# Patient Record
Sex: Female | Born: 1935
Health system: Southern US, Community
[De-identification: ages and names within clinical notes are randomized; demographics above are authoritative.]

## PROBLEM LIST (undated history)

## (undated) DIAGNOSIS — I10 Essential (primary) hypertension: Secondary | ICD-10-CM

## (undated) DIAGNOSIS — G459 Transient cerebral ischemic attack, unspecified: Secondary | ICD-10-CM

## (undated) DIAGNOSIS — J45909 Unspecified asthma, uncomplicated: Secondary | ICD-10-CM

## (undated) DIAGNOSIS — Z8719 Personal history of other diseases of the digestive system: Secondary | ICD-10-CM

## (undated) DIAGNOSIS — H9190 Unspecified hearing loss, unspecified ear: Secondary | ICD-10-CM

## (undated) DIAGNOSIS — Z8711 Personal history of peptic ulcer disease: Secondary | ICD-10-CM

## (undated) DIAGNOSIS — C44321 Squamous cell carcinoma of skin of nose: Secondary | ICD-10-CM

## (undated) DIAGNOSIS — I351 Nonrheumatic aortic (valve) insufficiency: Secondary | ICD-10-CM

## (undated) DIAGNOSIS — K56609 Unspecified intestinal obstruction, unspecified as to partial versus complete obstruction: Secondary | ICD-10-CM

## (undated) DIAGNOSIS — I099 Rheumatic heart disease, unspecified: Secondary | ICD-10-CM

## (undated) DIAGNOSIS — R7611 Nonspecific reaction to tuberculin skin test without active tuberculosis: Secondary | ICD-10-CM

## (undated) DIAGNOSIS — C44311 Basal cell carcinoma of skin of nose: Secondary | ICD-10-CM

## (undated) DIAGNOSIS — K5090 Crohn's disease, unspecified, without complications: Secondary | ICD-10-CM

## (undated) DIAGNOSIS — E039 Hypothyroidism, unspecified: Secondary | ICD-10-CM

## (undated) DIAGNOSIS — K219 Gastro-esophageal reflux disease without esophagitis: Secondary | ICD-10-CM

## (undated) DIAGNOSIS — Z8679 Personal history of other diseases of the circulatory system: Secondary | ICD-10-CM

## (undated) DIAGNOSIS — I350 Nonrheumatic aortic (valve) stenosis: Secondary | ICD-10-CM

## (undated) DIAGNOSIS — M1990 Unspecified osteoarthritis, unspecified site: Secondary | ICD-10-CM

## (undated) DIAGNOSIS — H353130 Nonexudative age-related macular degeneration, bilateral, stage unspecified: Secondary | ICD-10-CM

## (undated) HISTORY — PX: CARPAL TUNNEL RELEASE: SHX101

## (undated) HISTORY — PX: CATARACT EXTRACTION W/ INTRAOCULAR LENS IMPLANT: SHX1309

## (undated) HISTORY — PX: CHOLECYSTECTOMY: SHX55

## (undated) HISTORY — PX: SQUAMOUS CELL CARCINOMA EXCISION: SHX2433

## (undated) HISTORY — PX: TONSILLECTOMY: SUR1361

## (undated) HISTORY — PX: BASAL CELL CARCINOMA EXCISION: SHX1214

---

## 2017-05-10 ENCOUNTER — Ambulatory Visit: Payer: Medicare Other | Attending: Ophthalmology | Admitting: Occupational Therapy

## 2017-05-10 DIAGNOSIS — H53413 Scotoma involving central area, bilateral: Secondary | ICD-10-CM | POA: Diagnosis present

## 2017-05-10 NOTE — Therapy (Signed)
[facility] [address] [place], [place], [address] Phone: [phone]   Fax:  [phone]  Occupational Therapy Evaluation  Patient Details  Name: Terri [name] MRN: [id number] Date of Birth: January 29, 1931 Referring Provider: Dr. Zadie Rhine  Encounter Date: 05/10/2017      OT End of Session - 05/10/17 1328    Visit Number 1   Number of Visits 1   Authorization Type BCBS Medicare    OT Start Time 1320   OT Stop Time 1420   OT Time Calculation (min) 60 min   Activity Tolerance Patient tolerated treatment well   Behavior During Therapy [facility] for tasks assessed/performed      No past medical history on file.  No past surgical history on file.  There were no vitals filed for this visit.      Subjective Assessment - 05/10/17 1324    Subjective  Pt with macular degneration presents with a decline in vision that impedes performance of ADLS/ IADLS   Pertinent History see above   Patient Stated Goals magnification for reading   Currently in Pain? No/denies           [facility] OT Assessment - 05/10/17 1329      Assessment   Diagnosis macular degeneration   Referring Provider Dr. Zadie Rhine   Onset Date 03/07/17  diagnosed 1996     Precautions   Precautions Fall     Balance Screen   Has the patient fallen in the past 6 months Yes   How many times? 1   Has the patient had a decrease in activity level because of a fear of falling?  Yes   Is the patient reluctant to leave their home because of a fear of falling?  No     Home  Environment   Family/patient expects to be discharged to: Private residence   [place] to live on main level with bedroom/bathroom   Bathroom Shower/Tub [place] With Son     Prior Function   Level of Independence Independent with basic ADLs;Independent with household mobility without device   Vocation Retired     ADL   ADL comments modified independent with basic ADLS     IADL   Shopping Needs to be accompanied on any shopping trip   Light Housekeeping Performs light daily tasks but cannot maintain acceptable level of cleanliness   Meal Prep Able to complete simple warm meal prep  stovetop only   Medication Management --  uses a Transport planner Requires assistance     Mobility   Mobility Status Independent     Vision - History   Visual History Macular degeneration   Patient Visual Report Central vision impairment     Vision Assessment   Vision Assessment Vision tested   Per MD/OD Report OD 20/400, OS E card@ 2 ft   Reading Acuity 20/320   Comment Pt reports increased difficulty reading, and watching TV. Pt reports she is able to read large print on her I pad. Pt has stopped driving     Cognition   Overall Cognitive Status Within Functional Limits for tasks assessed   Mini Mental State Exam  28/30                         OT Education - 05/10/17 1439    Education provided Yes  Education Details use of [place] mini and binocular spectacles for TV viewing   Person(s) Educated Patient   Methods Explanation;Demonstration;Verbal cues;Handout   Comprehension Verbalized understanding;Returned demonstration;Verbal cues required                    Plan - 05/10/17 1435    Clinical Impression Statement Pt with macular degeneration presents with visual deficits which impede performance of ADLS/ IADLS. Pt can beneit from skilled occupational therapy to maximize pt's safety and independence.   Occupational Profile and client history currently impacting functional performance visual deficits impede pt's ability to read, drive and safely navigate an unfamiliar environment   Occupational performance deficits (Please refer to evaluation for details): ADL's;IADL's;Leisure;Social Participation   Rehab Potential Good   Current Impairments/barriers affecting  progress: visual impairment   OT Frequency One time visit   OT Duration 8 weeks   OT Treatment/Interventions Visual/perceptual remediation/compensation;DME and/or AE instruction;Patient/family education   Plan Pt was seen for evaluation and treatment on day of eval. Pt was provided with education regarding a video handheld magnifier use and TV viewing binoculars. Pt demonstrates understanding of all education and pt does not require additional OT at this time, therefore no goals were set.   Consulted and Agree with Plan of Care Patient      Patient will benefit from skilled therapeutic intervention in order to improve the following deficits and impairments:  Impaired vision/preception  Visit Diagnosis: [place] involving central area, bilateral - Plan: Ot plan of care cert/re-cert      G-Codes - 02/58/52 1441    Functional Assessment Tool Used (Outpatient only) clinical impressions   Functional Limitation Self care   Self Care Current Status [phone]) At least 1 percent but less than 20 percent impaired, limited or restricted   Self Care Goal Status ([id number]) At least 1 percent but less than 20 percent impaired, limited or restricted   Self Care Discharge Status [phone]) At least 1 percent but less than 20 percent impaired, limited or restricted      Problem List There are no active problems to display for this patient.   RINE,KATHRYN 05/10/2017, 2:45 PM Theone Murdoch, OTR/L [phone] Phone: [phone] 2:45 PM 05/10/17 [facility] [address] [place], [place], [address] Phone: [phone]   Fax:  [phone]  Name: Terri [name] MRN: [id number] Date of Birth: [birth year]

## 2017-05-31 ENCOUNTER — Emergency Department (HOSPITAL_COMMUNITY): Payer: Medicare Other

## 2017-05-31 ENCOUNTER — Encounter (HOSPITAL_COMMUNITY): Payer: Self-pay | Admitting: Emergency Medicine

## 2017-05-31 ENCOUNTER — Inpatient Hospital Stay (HOSPITAL_COMMUNITY)
Admission: EM | Admit: 2017-05-31 | Discharge: 2017-06-03 | DRG: 386 | Disposition: A | Discharge status: Home / Self Care | Payer: Medicare Other | Attending: Family Medicine | Admitting: Family Medicine

## 2017-05-31 DIAGNOSIS — Z9049 Acquired absence of other specified parts of digestive tract: Secondary | ICD-10-CM | POA: Diagnosis not present

## 2017-05-31 DIAGNOSIS — I060 Rheumatic aortic stenosis: Secondary | ICD-10-CM | POA: Diagnosis present

## 2017-05-31 DIAGNOSIS — H353130 Nonexudative age-related macular degeneration, bilateral, stage unspecified: Secondary | ICD-10-CM | POA: Diagnosis present

## 2017-05-31 DIAGNOSIS — K5090 Crohn's disease, unspecified, without complications: Secondary | ICD-10-CM

## 2017-05-31 DIAGNOSIS — E039 Hypothyroidism, unspecified: Secondary | ICD-10-CM | POA: Diagnosis present

## 2017-05-31 DIAGNOSIS — K5000 Crohn's disease of small intestine without complications: Secondary | ICD-10-CM | POA: Diagnosis not present

## 2017-05-31 DIAGNOSIS — Z9842 Cataract extraction status, left eye: Secondary | ICD-10-CM | POA: Diagnosis not present

## 2017-05-31 DIAGNOSIS — Z87891 Personal history of nicotine dependence: Secondary | ICD-10-CM

## 2017-05-31 DIAGNOSIS — Z85828 Personal history of other malignant neoplasm of skin: Secondary | ICD-10-CM | POA: Diagnosis not present

## 2017-05-31 DIAGNOSIS — H548 Legal blindness, as defined in USA: Secondary | ICD-10-CM | POA: Diagnosis present

## 2017-05-31 DIAGNOSIS — Z8673 Personal history of transient ischemic attack (TIA), and cerebral infarction without residual deficits: Secondary | ICD-10-CM

## 2017-05-31 DIAGNOSIS — E860 Dehydration: Secondary | ICD-10-CM | POA: Insufficient documentation

## 2017-05-31 DIAGNOSIS — H9190 Unspecified hearing loss, unspecified ear: Secondary | ICD-10-CM | POA: Insufficient documentation

## 2017-05-31 DIAGNOSIS — R159 Full incontinence of feces: Secondary | ICD-10-CM | POA: Diagnosis not present

## 2017-05-31 DIAGNOSIS — T380X5A Adverse effect of glucocorticoids and synthetic analogues, initial encounter: Secondary | ICD-10-CM | POA: Diagnosis present

## 2017-05-31 DIAGNOSIS — K50012 Crohn's disease of small intestine with intestinal obstruction: Secondary | ICD-10-CM | POA: Diagnosis present

## 2017-05-31 DIAGNOSIS — K567 Ileus, unspecified: Secondary | ICD-10-CM | POA: Diagnosis present

## 2017-05-31 DIAGNOSIS — K56609 Unspecified intestinal obstruction, unspecified as to partial versus complete obstruction: Secondary | ICD-10-CM | POA: Diagnosis not present

## 2017-05-31 DIAGNOSIS — D72829 Elevated white blood cell count, unspecified: Secondary | ICD-10-CM

## 2017-05-31 DIAGNOSIS — I10 Essential (primary) hypertension: Secondary | ICD-10-CM | POA: Diagnosis present

## 2017-05-31 DIAGNOSIS — N179 Acute kidney failure, unspecified: Secondary | ICD-10-CM | POA: Diagnosis present

## 2017-05-31 DIAGNOSIS — R1114 Bilious vomiting: Secondary | ICD-10-CM | POA: Diagnosis not present

## 2017-05-31 DIAGNOSIS — J45909 Unspecified asthma, uncomplicated: Secondary | ICD-10-CM | POA: Diagnosis present

## 2017-05-31 DIAGNOSIS — Z961 Presence of intraocular lens: Secondary | ICD-10-CM | POA: Diagnosis present

## 2017-05-31 DIAGNOSIS — Z8679 Personal history of other diseases of the circulatory system: Secondary | ICD-10-CM | POA: Insufficient documentation

## 2017-05-31 DIAGNOSIS — H3530 Unspecified macular degeneration: Secondary | ICD-10-CM | POA: Insufficient documentation

## 2017-05-31 DIAGNOSIS — Z79899 Other long term (current) drug therapy: Secondary | ICD-10-CM | POA: Diagnosis not present

## 2017-05-31 DIAGNOSIS — Z8711 Personal history of peptic ulcer disease: Secondary | ICD-10-CM

## 2017-05-31 DIAGNOSIS — Z7902 Long term (current) use of antithrombotics/antiplatelets: Secondary | ICD-10-CM

## 2017-05-31 DIAGNOSIS — R1110 Vomiting, unspecified: Secondary | ICD-10-CM | POA: Diagnosis present

## 2017-05-31 DIAGNOSIS — K219 Gastro-esophageal reflux disease without esophagitis: Secondary | ICD-10-CM | POA: Diagnosis present

## 2017-05-31 DIAGNOSIS — Z9841 Cataract extraction status, right eye: Secondary | ICD-10-CM

## 2017-05-31 HISTORY — DX: Personal history of other diseases of the circulatory system: Z86.79

## 2017-05-31 HISTORY — DX: Unspecified osteoarthritis, unspecified site: M19.90

## 2017-05-31 HISTORY — DX: Personal history of other diseases of the digestive system: Z87.19

## 2017-05-31 HISTORY — DX: Crohn's disease, unspecified, without complications: K50.90

## 2017-05-31 HISTORY — DX: Rheumatic heart disease, unspecified: I09.9

## 2017-05-31 HISTORY — DX: Unspecified intestinal obstruction, unspecified as to partial versus complete obstruction: K56.609

## 2017-05-31 HISTORY — DX: Squamous cell carcinoma of skin of nose: C44.321

## 2017-05-31 HISTORY — DX: Transient cerebral ischemic attack, unspecified: G45.9

## 2017-05-31 HISTORY — DX: Unspecified hearing loss, unspecified ear: H91.90

## 2017-05-31 HISTORY — DX: Nonrheumatic aortic (valve) insufficiency: I35.1

## 2017-05-31 HISTORY — DX: Basal cell carcinoma of skin of nose: C44.311

## 2017-05-31 HISTORY — DX: Personal history of peptic ulcer disease: Z87.11

## 2017-05-31 HISTORY — DX: Gastro-esophageal reflux disease without esophagitis: K21.9

## 2017-05-31 HISTORY — DX: Unspecified asthma, uncomplicated: J45.909

## 2017-05-31 HISTORY — DX: Nonrheumatic aortic (valve) stenosis: I35.0

## 2017-05-31 HISTORY — DX: Hypothyroidism, unspecified: E03.9

## 2017-05-31 HISTORY — DX: Nonexudative age-related macular degeneration, bilateral, stage unspecified: H35.3130

## 2017-05-31 HISTORY — DX: Nonspecific reaction to tuberculin skin test without active tuberculosis: R76.11

## 2017-05-31 LAB — COMPREHENSIVE METABOLIC PANEL
ALBUMIN: 3.4 g/dL — AB (ref 3.5–5.0)
ALT: 10 U/L — AB (ref 14–54)
AST: 20 U/L (ref 15–41)
Alkaline Phosphatase: 83 U/L (ref 38–126)
Anion gap: 12 (ref 5–15)
BUN: 15 mg/dL (ref 6–20)
CHLORIDE: 101 mmol/L (ref 101–111)
CO2: 23 mmol/L (ref 22–32)
CREATININE: 1.3 mg/dL — AB (ref 0.44–1.00)
Calcium: 8.5 mg/dL — ABNORMAL LOW (ref 8.9–10.3)
GFR calc non Af Amer: 37 mL/min — ABNORMAL LOW (ref >60)
GFR, EST AFRICAN AMERICAN: 43 mL/min — AB (ref >60)
GLUCOSE: 133 mg/dL — AB (ref 65–99)
Potassium: 4.3 mmol/L (ref 3.5–5.1)
SODIUM: 136 mmol/L (ref 135–145)
Total Bilirubin: 1 mg/dL (ref 0.3–1.2)
Total Protein: 6.7 g/dL (ref 6.5–8.1)

## 2017-05-31 LAB — CBC WITH DIFFERENTIAL/PLATELET
BASOS ABS: 0 10*3/uL (ref 0.0–0.1)
BASOS PCT: 0 %
EOS ABS: 0 10*3/uL (ref 0.0–0.7)
EOS PCT: 0 %
HCT: 40.3 % (ref 36.0–46.0)
HEMOGLOBIN: 13.2 g/dL (ref 12.0–15.0)
LYMPHS ABS: 2.4 10*3/uL (ref 0.7–4.0)
Lymphocytes Relative: 18 %
MCH: 28.9 pg (ref 26.0–34.0)
MCHC: 32.8 g/dL (ref 30.0–36.0)
MCV: 88.4 fL (ref 78.0–100.0)
Monocytes Absolute: 1 10*3/uL (ref 0.1–1.0)
Monocytes Relative: 8 %
NEUTROS PCT: 74 %
Neutro Abs: 10 10*3/uL — ABNORMAL HIGH (ref 1.7–7.7)
PLATELETS: 203 10*3/uL (ref 150–400)
RBC: 4.56 MIL/uL (ref 3.87–5.11)
RDW: 14.9 % (ref 11.5–15.5)
WBC: 13.4 10*3/uL — AB (ref 4.0–10.5)

## 2017-05-31 LAB — URINALYSIS, ROUTINE W REFLEX MICROSCOPIC
Bilirubin Urine: NEGATIVE
GLUCOSE, UA: NEGATIVE mg/dL
Hgb urine dipstick: NEGATIVE
Ketones, ur: 5 mg/dL — AB
LEUKOCYTES UA: NEGATIVE
NITRITE: NEGATIVE
PH: 5 (ref 5.0–8.0)
Protein, ur: NEGATIVE mg/dL

## 2017-05-31 LAB — CBC
HEMATOCRIT: 37.5 % (ref 36.0–46.0)
Hemoglobin: 12 g/dL (ref 12.0–15.0)
MCH: 28.4 pg (ref 26.0–34.0)
MCHC: 32 g/dL (ref 30.0–36.0)
MCV: 88.9 fL (ref 78.0–100.0)
Platelets: 194 10*3/uL (ref 150–400)
RBC: 4.22 MIL/uL (ref 3.87–5.11)
RDW: 15 % (ref 11.5–15.5)
WBC: 13.6 10*3/uL — ABNORMAL HIGH (ref 4.0–10.5)

## 2017-05-31 LAB — CREATININE, SERUM
CREATININE: 1.2 mg/dL — AB (ref 0.44–1.00)
GFR calc non Af Amer: 41 mL/min — ABNORMAL LOW (ref >60)
GFR, EST AFRICAN AMERICAN: 47 mL/min — AB (ref >60)

## 2017-05-31 LAB — LIPASE, BLOOD: Lipase: 16 U/L (ref 11–51)

## 2017-05-31 MED ORDER — IOPAMIDOL (ISOVUE-300) INJECTION 61%
INTRAVENOUS | Status: AC
  Filled 2017-05-31: qty 100

## 2017-05-31 MED ORDER — ONDANSETRON HCL 4 MG/2ML IJ SOLN: 4.0000 mg | Freq: Four times a day (QID) | INTRAMUSCULAR | Status: DC | PRN

## 2017-05-31 MED ORDER — ONDANSETRON HCL 4 MG/2ML IJ SOLN
4.0000 mg | Freq: Once | INTRAMUSCULAR | Status: AC
  Administered 2017-05-31: 4 mg via INTRAVENOUS
  Filled 2017-05-31: qty 2

## 2017-05-31 MED ORDER — FAMOTIDINE IN NACL 20-0.9 MG/50ML-% IV SOLN
20.0000 mg | Freq: Once | INTRAVENOUS | Status: AC
  Administered 2017-05-31: 20 mg via INTRAVENOUS
  Filled 2017-05-31: qty 50

## 2017-05-31 MED ORDER — ENOXAPARIN SODIUM 30 MG/0.3ML ~~LOC~~ SOLN
30.0000 mg | SUBCUTANEOUS | Status: DC
  Administered 2017-05-31 – 2017-06-02 (×3): 30 mg via SUBCUTANEOUS
  Filled 2017-05-31 (×3): qty 0.3

## 2017-05-31 MED ORDER — SODIUM CHLORIDE 0.9 % IV BOLUS (SEPSIS)
1000.0000 mL | Freq: Once | INTRAVENOUS | Status: AC
  Administered 2017-05-31: 1000 mL via INTRAVENOUS

## 2017-05-31 MED ORDER — IOPAMIDOL (ISOVUE-300) INJECTION 61%
INTRAVENOUS | Status: AC
  Administered 2017-05-31: 100 mL
  Filled 2017-05-31: qty 30

## 2017-05-31 MED ORDER — ONDANSETRON HCL 4 MG/2ML IJ SOLN: 4.0000 mg | Freq: Once | INTRAMUSCULAR | Status: DC

## 2017-05-31 MED ORDER — SODIUM CHLORIDE 0.9 % IV SOLN
INTRAVENOUS | Status: AC
  Administered 2017-05-31 – 2017-06-01 (×2): via INTRAVENOUS

## 2017-05-31 MED ORDER — METOCLOPRAMIDE HCL 5 MG/ML IJ SOLN
10.0000 mg | Freq: Once | INTRAMUSCULAR | Status: AC
  Administered 2017-05-31: 10 mg via INTRAVENOUS
  Filled 2017-05-31: qty 2

## 2017-05-31 MED ORDER — ONDANSETRON HCL 4 MG PO TABS: 4.0000 mg | ORAL_TABLET | Freq: Four times a day (QID) | ORAL | Status: DC | PRN

## 2017-05-31 MED ORDER — ACETAMINOPHEN 650 MG RE SUPP
650.0000 mg | Freq: Four times a day (QID) | RECTAL | Status: DC | PRN
  Filled 2017-05-31: qty 1

## 2017-05-31 MED ORDER — MORPHINE SULFATE (PF) 4 MG/ML IV SOLN
1.0000 mg | INTRAVENOUS | Status: DC | PRN
Start: 2017-05-31 — End: 2017-06-03
  Administered 2017-05-31: 1 mg via INTRAVENOUS
  Filled 2017-05-31: qty 1

## 2017-05-31 MED ORDER — PANTOPRAZOLE SODIUM 40 MG IV SOLR
40.0000 mg | INTRAVENOUS | Status: DC
Start: 2017-05-31 — End: 2017-06-02
  Administered 2017-05-31 – 2017-06-01 (×2): 40 mg via INTRAVENOUS
  Filled 2017-05-31 (×2): qty 40

## 2017-05-31 MED ORDER — ACETAMINOPHEN 325 MG PO TABS: 650.0000 mg | ORAL_TABLET | Freq: Four times a day (QID) | ORAL | Status: DC | PRN

## 2017-05-31 NOTE — ED Notes (Signed)
Dr. Cardama at bedside.  

## 2017-05-31 NOTE — ED Triage Notes (Signed)
Pt here for abd pain and vomiting; pt was recently treated for UTI with cipro

## 2017-05-31 NOTE — ED Notes (Signed)
Patient transported to CT 

## 2017-05-31 NOTE — ED Notes (Signed)
Resident at bedside.  

## 2017-05-31 NOTE — ED Notes (Signed)
Provider at bedside

## 2017-05-31 NOTE — H&P (Signed)
[facility] History and Physical  Terri [name] QHU:[id number] DOB: February 14, 1931 DOA: 05/31/2017  Referring physician: Dr Zetta Bills PCP: Prince Solian, MD  GI: [facility]  Chief Complaint: vomiting, abdominal pain  HPI: Terri [name] is a pleasant 83yo recently relocated to [place] to live with son after husband passed away in 2023/01/12. PMH of Crohn's, TIA, macular degeneration, HOH, remote cholecystectomy, rheumatic fever. Pt presents today with 2 day hx of abdominal pain and vomiting. Pt with hx of Crohn's ileitus on Mesalazine which was recently increased by GI from 4 pills to 8 pills/day a couple of weeks ago along with addition of Budesonide for suspected Crohns flare. Son is pediatrician, treated pt one week ago for suspected UTI with Cipro. Pt states symptoms become considerably worse 2 days ago with diffuse, stabbing lower abdominal pain and bilious emesis prompting ED visit. Pt denies recent fever, headache, dizziness, chest pain, sob, diarrhea, melana, hematochezia or hematemesis.   ED COURSE CT abdomen c/w Crohn's disease with secondary bowel obstruction. NG tube placed and 1L bilious emesis returned. Labs remarkable for slightly elevated WBC at 13.4, LFTs and lipase wnl. Creatinine 1.3 with no baseline labs available. [facility] has been asked to admit.   Review of Systems:  As stated in hpi, otherwise negative   Past Medical History:  Diagnosis Date  . Aortic stenosis   . Crohn's disease ([place])   . HOH (hard of hearing)   . Hx of cholecystectomy   . Hx of rheumatic fever   . Hypothyroidism   . Macular degeneration   . TIA (transient ischemic attack)     Social History:  reports that she has never smoked. She has never used smokeless tobacco. She reports that she does not drink alcohol or use drugs. Widowed in 12/2016. Retired Therapist, sports. Relocated to [place] to live with son.   No Known Allergies  History reviewed. No pertinent family history.   Prior to Admission  medications   Medication Sig Start Date End Date Taking? Authorizing Provider  Budesonide 9 MG TB24 Take 9 mg by mouth daily. 05/30/17  Yes [provider]  carboxymethylcellulose (REFRESH PLUS) 0.5 % SOLN Place 1 drop into both eyes daily as needed (dry eyes).   Yes [provider]  clopidogrel (PLAVIX) 75 MG tablet Take 75 mg by mouth daily. 04/26/17  Yes [provider]  folic acid (FOLVITE) 1 MG tablet Take 1 mg by mouth daily.   Yes [provider]  levothyroxine (SYNTHROID, LEVOTHROID) 100 MCG tablet Take 100 mcg by mouth daily before breakfast.   Yes [provider]  losartan (COZAAR) 50 MG tablet Take 50 mg by mouth daily. 04/26/17  Yes [provider]  Multiple Vitamin (MULTIVITAMIN WITH MINERALS) TABS tablet Take 1 tablet by mouth daily.   Yes [provider]  oxybutynin (DITROPAN) 5 MG tablet Take 5 mg by mouth daily.   Yes [provider]  pantoprazole (PROTONIX) 40 MG tablet Take 40 mg by mouth every morning. 05/15/17  Yes [provider]   Physical Exam: Vitals:   05/31/17 1434 05/31/17 1445 05/31/17 1500 05/31/17 1521  BP: 138/84 130/85 132/81   Pulse: 90 96 96 96  Resp: 20 (!) 26 18 20   Temp:      TempSrc:      SpO2: 98% 94% 95% 95%  Height:        Wt Readings from Last 3 Encounters:  No data found for Wt    General: sitting upright in bed, awake  and alert in NAD Eyes: EOMI, normal lids, iris ENT: grossly normal hearing, lips & tongue, mucous membranes dry Neck: no LAD, masses or thyromegaly Cardiovascular: S1S2 RRR, no m/r/g. No LE edema.  Respiratory: CTA bilaterally, no w/r/r. Normal respiratory effort. Abdomen: soft, ntnd, scant BS. NGT with copius amt bilious emesis Skin: no rash or induration seen on limited exam Musculoskeletal: grossly normal tone BUE/BLE Psychiatric: grossly normal mood and affect, speech fluent and appropriate Neurologic: CN 2-12 grossly intact          Labs  on Admission:  Basic Metabolic Panel:  Recent Labs Lab 05/31/17 1146  NA 136  K 4.3  CL 101  CO2 23  GLUCOSE 133*  BUN 15  CREATININE 1.30*  CALCIUM 8.5*   Liver Function Tests:  Recent Labs Lab 05/31/17 1146  AST 20  ALT 10*  ALKPHOS 83  BILITOT 1.0  PROT 6.7  ALBUMIN 3.4*    Recent Labs Lab 05/31/17 1134  LIPASE 16   No results for input(s): AMMONIA in the last 168 hours. CBC:  Recent Labs Lab 05/31/17 1146  WBC 13.4*  NEUTROABS 10.0*  HGB 13.2  HCT 40.3  MCV 88.4  PLT 203   Cardiac Enzymes: No results for input(s): CKTOTAL, CKMB, CKMBINDEX, TROPONINI in the last 168 hours.  BNP (last 3 results) No results for input(s): BNP in the last 8760 hours.  ProBNP (last 3 results) No results for input(s): PROBNP in the last 8760 hours.   Creatinine clearance cannot be calculated (Unknown ideal weight.)  CBG: No results for input(s): GLUCAP in the last 168 hours.  Radiological Exams on Admission: Ct Abdomen Pelvis W Contrast  Result Date: 05/31/2017 CLINICAL DATA:  Initial evaluation for acute diffuse lower abdominal pain, emesis. History of Crohn's. EXAM: CT ABDOMEN AND PELVIS WITH CONTRAST TECHNIQUE: Multidetector CT imaging of the abdomen and pelvis was performed using the standard protocol following bolus administration of intravenous contrast. CONTRAST:  141mL ISOVUE-300 IOPAMIDOL (ISOVUE-300) INJECTION 61% COMPARISON:  None available. FINDINGS: Lower chest: Scattered atelectatic changes and/or scarring present within the visualized lung bases. Visualized lungs are otherwise clear. No pleural or pericardial effusion. Hepatobiliary: Liver demonstrates a normal contrast enhanced appearance. Gallbladder is absent. No biliary dilatation. Pancreas: Diffuse fatty infiltration of the pancreas noted. Pancreas otherwise unremarkable without mass lesion or acute inflammatory changes. Spleen: Spleen within normal limits. Adrenals/Urinary Tract: Adrenal glands are  normal. Kidneys somewhat atrophic bilateral scattered cortical thinning and scarring, slightly greater on the left. Secondary somewhat irregular nephrograms bilaterally related to scarring. No nephrolithiasis or hydronephrosis. No focal enhancing renal mass. No hydroureter. Bladder largely decompressed without acute abnormality. Stomach/Bowel: Stomach moderately distended with fluid within the gastric lumen. Stomach otherwise unremarkable. Multiple dilated loops of fluid-filled small bowel are seen scattered throughout the abdomen, measuring up to 3.6 cm in diameter. Few scattered internal air-fluid levels. These taper to an area of inflamed appearing ileum in the lower mid pelvis (series 3, image 74), likely secondary to underlying Crohn's disease. Associated stricture may be present. There is a short skip area just distally with a second segment of narrowed ileum with associated wall thickening at the level of the terminal ileum (series 5, image 51). Superimposed rectangular density within the ileum at this level likely reflects an ingested pill or medication (series 5, image 53). Again, underlying stricture could be present. Adjacent appendix is within normal limits without acute inflammatory changes. Distally, the colon is largely decompressed without acute inflammatory changes. Vascular/Lymphatic: Normal intravascular enhancement seen throughout  the intra-abdominal aorta and its branch vessels. Mild aortic atherosclerosis. No adenopathy. Reproductive: Uterus and ovaries within normal limits. Other: No free intraperitoneal air. Small volume free fluid seen adjacent to the liver, likely reactive. Musculoskeletal: No acute osseus abnormality. No worrisome lytic or blastic osseous lesions. IMPRESSION: 1. Multifocal areas of wall thickening with narrowing and/or stricturing of the ileum, consistent with underlying Crohn's disease. Secondary small bowel obstruction with multiple dilated fluid-filled loops of small  bowel proximally. 2. Small volume free fluid within the abdomen, likely reactive. 3. Atrophic kidneys bilaterally with prominent cortical scarring, slightly worse on the left. Electronically Signed   By: Jeannine Boga M.D.   On: 05/31/2017 14:28   Dg Abd Portable 1 View  Result Date: 05/31/2017 CLINICAL DATA:  NG tube placement. EXAM: PORTABLE ABDOMEN - 1 VIEW COMPARISON:  CT abdomen and pelvis 05/31/2017 FINDINGS: An enteric tube has been placed with tip and side hole projecting over the stomach. There are multiple loops of moderately dilated small bowel as seen on recent CT. Excreted IV contrast is present in both renal collecting systems and in the distal left ureter. No acute osseous abnormality is seen. IMPRESSION: 1. Enteric tube in the stomach. 2. Small bowel dilatation consistent with obstruction as detailed on recent CT. Electronically Signed   By: Logan Bores M.D.   On: 05/31/2017 15:23     Assessment/Plan Patient Active Problem List   Diagnosis Date Noted  . SBO (small bowel obstruction) ([place]) 05/31/2017  . Crohn's disease ([place]) 05/31/2017  . Hx-TIA (transient ischemic attack) 05/31/2017  . HOH (hard of hearing) 05/31/2017  . Leukocytosis 05/31/2017     SBO in setting of Crohn's disease -continue NGT for now. NPO for bowel rest -Spoke with [facility] who will consult. Will await recommendations -consider surgery consult if no improvement -IV PPI -resume meds with po's   Clinical dehydration -secondary to poor po intake and vomiting -creatinine 1.3. No baseline available, will monitor trend -Gentle IVFs for now. Monitor with NG outpt  Leukocytosis, mild -AND 10.0. Suspect reactive with above.  -no evidence of infection with u/a unremarkable. No recent fever Monitor trend  Hx TIA -resume Plavix with po's   Code Status: full code  DVT Prophylaxis: SQ Lovenox Family Communication: son at bedside on admission eval Disposition Plan: Pending  Improvement    Dionne Milo, NP [phone] [facility] [email or web address] Password [email or web address]

## 2017-05-31 NOTE — ED Notes (Signed)
Dr made aware pt running 84 on ox.

## 2017-05-31 NOTE — ED Notes (Signed)
Pt taken to restroom via wheelchair with this RN.

## 2017-05-31 NOTE — ED Provider Notes (Signed)
[facility] DEPT Provider Note   CSN: [id number] Arrival date & time: 05/31/17  1009     History   Chief Complaint Chief Complaint  Patient presents with  . Emesis  . Abdominal Pain    HPI Terri [name] is a 83 y.o. female.  Pt presents to ED for diffuse lower quadrant abdominal pain, nausea, bilious emesis beginning 48 hours ago.  She describes the pain as sharp and stabbing and that it comes and goes, is worse with eating.  Pt received zofran from her son who is a pediatrician, who also treated her for a suspected UTI one week ago with Cipro.  Pt was feeling better took full course of cipro and urinary symptoms completely resolved.  Pt has a history of crohn's ileitis and is on pentasa recently increased from 4 pills per day to 8.  Pt has a history of rheumatic aortic stenosis and TIA's as well and is on plavix.  She also takex synthroid for hypothyroidism.        Past Medical History:  Diagnosis Date  . Aortic stenosis   . Crohn's disease ([place])     There are no active problems to display for this patient.   History reviewed. No pertinent surgical history.  OB History    No data available       Home Medications    Prior to Admission medications   Medication Sig Start Date End Date Taking? Authorizing Provider  Budesonide 9 MG TB24 Take 9 mg by mouth daily. 05/30/17  Yes [provider]  carboxymethylcellulose (REFRESH PLUS) 0.5 % SOLN Place 1 drop into both eyes daily as needed (dry eyes).   Yes [provider]  clopidogrel (PLAVIX) 75 MG tablet Take 75 mg by mouth daily. 04/26/17  Yes [provider]  folic acid (FOLVITE) 1 MG tablet Take 1 mg by mouth daily.   Yes [provider]  levothyroxine (SYNTHROID, LEVOTHROID) 100 MCG tablet Take 100 mcg by mouth daily before breakfast.   Yes [provider]  losartan (COZAAR) 50 MG tablet Take 50 mg by mouth daily. 04/26/17  Yes [provider]  Multiple  Vitamin (MULTIVITAMIN WITH MINERALS) TABS tablet Take 1 tablet by mouth daily.   Yes [provider]  oxybutynin (DITROPAN) 5 MG tablet Take 5 mg by mouth daily.   Yes [provider]  pantoprazole (PROTONIX) 40 MG tablet Take 40 mg by mouth every morning. 05/15/17  Yes [provider]    Family History History reviewed. No pertinent family history.  Social History Social History  Substance Use Topics  . Smoking status: Never Smoker  . Smokeless tobacco: Never Used  . Alcohol use No     Allergies   Patient has no known allergies.   Review of Systems Review of Systems  Constitutional: Positive for appetite change and chills. Negative for fever.  HENT: Negative for ear pain and sore throat.   Eyes: Negative for pain and visual disturbance.  Respiratory: Negative for cough and shortness of breath.   Cardiovascular: Negative for chest pain and palpitations.  Gastrointestinal: Positive for nausea and vomiting. Negative for abdominal pain, blood in stool and diarrhea.  Endocrine: Negative for cold intolerance and heat intolerance.  Genitourinary: Negative for dysuria, frequency, hematuria, urgency and vaginal bleeding.  Musculoskeletal: Negative for arthralgias and back pain.  Skin: Negative for color change and rash.  Neurological: Negative for seizures and syncope.  Psychiatric/Behavioral: Negative for agitation, behavioral problems and confusion.  All other systems  reviewed and are negative.    Physical Exam Updated Vital Signs BP 132/81   Pulse 96   Temp (!) 97.5 F (36.4 C) (Oral)   Resp 20   Ht 5\' 2"  (1.575 m)   SpO2 95%   Physical Exam  Constitutional: She is oriented to person, place, and time. She appears well-developed and well-nourished. No distress.  HENT:  Head: Normocephalic and atraumatic.  Eyes: Conjunctivae are normal. No scleral icterus.  Neck: Neck supple.  Cardiovascular: Normal rate, regular rhythm and normal heart sounds.   Exam reveals no gallop and no friction rub.   No murmur heard. Pulmonary/Chest: Effort normal and breath sounds normal. No respiratory distress. She has no wheezes. She has no rales. She exhibits no tenderness.  Abdominal: Soft. She exhibits no distension.    Musculoskeletal: She exhibits no edema.  Neurological: She is alert and oriented to person, place, and time.  Skin: Skin is warm and dry.  Psychiatric: She has a normal mood and affect.  Nursing note and vitals reviewed.    ED Treatments / Results  Labs (all labs ordered are listed, but only abnormal results are displayed) Labs Reviewed  CBC WITH DIFFERENTIAL/PLATELET - Abnormal; Notable for the following:       Result Value   WBC 13.4 (*)    Neutro Abs 10.0 (*)    All other components within normal limits  COMPREHENSIVE METABOLIC PANEL - Abnormal; Notable for the following:    Glucose, Bld 133 (*)    Creatinine, Ser 1.30 (*)    Calcium 8.5 (*)    Albumin 3.4 (*)    ALT 10 (*)    GFR calc non Af Amer 37 (*)    GFR calc Af Amer 43 (*)    All other components within normal limits  URINALYSIS, ROUTINE W REFLEX MICROSCOPIC - Abnormal; Notable for the following:    Specific Gravity, Urine >1.046 (*)    Ketones, ur 5 (*)    All other components within normal limits  LIPASE, BLOOD    EKG  EKG Interpretation None       Radiology Ct Abdomen Pelvis W Contrast  Result Date: 05/31/2017 CLINICAL DATA:  Initial evaluation for acute diffuse lower abdominal pain, emesis. History of Crohn's. EXAM: CT ABDOMEN AND PELVIS WITH CONTRAST TECHNIQUE: Multidetector CT imaging of the abdomen and pelvis was performed using the standard protocol following bolus administration of intravenous contrast. CONTRAST:  151mL ISOVUE-300 IOPAMIDOL (ISOVUE-300) INJECTION 61% COMPARISON:  None available. FINDINGS: Lower chest: Scattered atelectatic changes and/or scarring present within the visualized lung bases. Visualized lungs are otherwise clear.  No pleural or pericardial effusion. Hepatobiliary: Liver demonstrates a normal contrast enhanced appearance. Gallbladder is absent. No biliary dilatation. Pancreas: Diffuse fatty infiltration of the pancreas noted. Pancreas otherwise unremarkable without mass lesion or acute inflammatory changes. Spleen: Spleen within normal limits. Adrenals/Urinary Tract: Adrenal glands are normal. Kidneys somewhat atrophic bilateral scattered cortical thinning and scarring, slightly greater on the left. Secondary somewhat irregular nephrograms bilaterally related to scarring. No nephrolithiasis or hydronephrosis. No focal enhancing renal mass. No hydroureter. Bladder largely decompressed without acute abnormality. Stomach/Bowel: Stomach moderately distended with fluid within the gastric lumen. Stomach otherwise unremarkable. Multiple dilated loops of fluid-filled small bowel are seen scattered throughout the abdomen, measuring up to 3.6 cm in diameter. Few scattered internal air-fluid levels. These taper to an area of inflamed appearing ileum in the lower mid pelvis (series 3, image 74), likely secondary to underlying Crohn's disease. Associated stricture may be  present. There is a short skip area just distally with a second segment of narrowed ileum with associated wall thickening at the level of the terminal ileum (series 5, image 51). Superimposed rectangular density within the ileum at this level likely reflects an ingested pill or medication (series 5, image 53). Again, underlying stricture could be present. Adjacent appendix is within normal limits without acute inflammatory changes. Distally, the colon is largely decompressed without acute inflammatory changes. Vascular/Lymphatic: Normal intravascular enhancement seen throughout the intra-abdominal aorta and its branch vessels. Mild aortic atherosclerosis. No adenopathy. Reproductive: Uterus and ovaries within normal limits. Other: No free intraperitoneal air. Small volume  free fluid seen adjacent to the liver, likely reactive. Musculoskeletal: No acute osseus abnormality. No worrisome lytic or blastic osseous lesions. IMPRESSION: 1. Multifocal areas of wall thickening with narrowing and/or stricturing of the ileum, consistent with underlying Crohn's disease. Secondary small bowel obstruction with multiple dilated fluid-filled loops of small bowel proximally. 2. Small volume free fluid within the abdomen, likely reactive. 3. Atrophic kidneys bilaterally with prominent cortical scarring, slightly worse on the left. Electronically Signed   By: Jeannine Boga M.D.   On: 05/31/2017 14:28   Dg Abd Portable 1 View  Result Date: 05/31/2017 CLINICAL DATA:  NG tube placement. EXAM: PORTABLE ABDOMEN - 1 VIEW COMPARISON:  CT abdomen and pelvis 05/31/2017 FINDINGS: An enteric tube has been placed with tip and side hole projecting over the stomach. There are multiple loops of moderately dilated small bowel as seen on recent CT. Excreted IV contrast is present in both renal collecting systems and in the distal left ureter. No acute osseous abnormality is seen. IMPRESSION: 1. Enteric tube in the stomach. 2. Small bowel dilatation consistent with obstruction as detailed on recent CT. Electronically Signed   By: Logan Bores M.D.   On: 05/31/2017 15:23    Procedures Procedures (including critical care time)  Medications Ordered in ED Medications  iopamidol (ISOVUE-300) 61 % injection (not administered)  sodium chloride 0.9 % bolus 1,000 mL (0 mLs Intravenous Stopped 05/31/17 1445)  famotidine (PEPCID) IVPB 20 mg premix (0 mg Intravenous Stopped 05/31/17 1232)  ondansetron (ZOFRAN) injection 4 mg (4 mg Intravenous Given 05/31/17 1159)  iopamidol (ISOVUE-300) 61 % injection (100 mLs  Contrast Given 05/31/17 1344)  metoCLOPramide (REGLAN) injection 10 mg (10 mg Intravenous Given 05/31/17 1335)     Initial Impression / Assessment and Plan / ED Course  I have reviewed the triage  vital signs and the nursing notes.  Pertinent labs & imaging results that were available during my care of the patient were reviewed by me and considered in my medical decision making (see chart for details).     Diffuse lower abdominal pain, emesis DDX: small bowel obstruction, Crohn's flare, UTI/Pyelonephritis -urinalysis, CBC,CMP -IV fluids, zofran, pepcid -CT abdomen and pelvis w/contrast shows crohn's ileitis w/stricture -Admit pt to [facility], consult placed to [facility]  Final Clinical Impressions(s) / ED Diagnoses   Final diagnoses:  Crohn's ileitis, with intestinal obstruction (HCC)  Bilious vomiting with nausea    New Prescriptions New Prescriptions   No medications on file     Katherine Roan, MD 05/31/17 1532

## 2017-05-31 NOTE — ED Provider Notes (Signed)
I have personally seen and examined the patient. I have reviewed the documentation on PMH/FH/Soc Hx. I have discussed the plan of care with the resident and patient.  I have reviewed and agree with the resident's documentation. Please see associated encounter note.   EKG Interpretation None         Mikhael Hendriks, Grayce Sessions, MD 05/31/17 1512

## 2017-06-01 ENCOUNTER — Inpatient Hospital Stay (HOSPITAL_COMMUNITY): Payer: Medicare Other

## 2017-06-01 LAB — CBC
HCT: 32.9 % — ABNORMAL LOW (ref 36.0–46.0)
Hemoglobin: 10.8 g/dL — ABNORMAL LOW (ref 12.0–15.0)
MCH: 29.2 pg (ref 26.0–34.0)
MCHC: 32.8 g/dL (ref 30.0–36.0)
MCV: 88.9 fL (ref 78.0–100.0)
PLATELETS: 175 10*3/uL (ref 150–400)
RBC: 3.7 MIL/uL — ABNORMAL LOW (ref 3.87–5.11)
RDW: 15.4 % (ref 11.5–15.5)
WBC: 9.7 10*3/uL (ref 4.0–10.5)

## 2017-06-01 LAB — BASIC METABOLIC PANEL
Anion gap: 8 (ref 5–15)
BUN: 13 mg/dL (ref 6–20)
CALCIUM: 7.6 mg/dL — AB (ref 8.9–10.3)
CO2: 23 mmol/L (ref 22–32)
CREATININE: 1.11 mg/dL — AB (ref 0.44–1.00)
Chloride: 107 mmol/L (ref 101–111)
GFR calc non Af Amer: 45 mL/min — ABNORMAL LOW (ref >60)
GFR, EST AFRICAN AMERICAN: 52 mL/min — AB (ref >60)
Glucose, Bld: 84 mg/dL (ref 65–99)
Potassium: 3.9 mmol/L (ref 3.5–5.1)
SODIUM: 138 mmol/L (ref 135–145)

## 2017-06-01 MED ORDER — METHYLPREDNISOLONE SODIUM SUCC 40 MG IJ SOLR
30.0000 mg | Freq: Two times a day (BID) | INTRAMUSCULAR | Status: DC
  Administered 2017-06-01 – 2017-06-02 (×4): 30 mg via INTRAVENOUS
  Filled 2017-06-01 (×4): qty 1

## 2017-06-01 MED ORDER — DIATRIZOATE MEGLUMINE & SODIUM 66-10 % PO SOLN
90.0000 mL | Freq: Once | ORAL | Status: AC
  Administered 2017-06-01: 90 mL via NASOGASTRIC
  Filled 2017-06-01 (×2): qty 90

## 2017-06-01 MED ORDER — SODIUM CHLORIDE 0.9 % IV SOLN
INTRAVENOUS | Status: DC
  Administered 2017-06-01 – 2017-06-03 (×3): via INTRAVENOUS

## 2017-06-01 NOTE — Progress Notes (Signed)
PROGRESS NOTE    Terri [name]  HUT:[id number] DOB: 1930/11/15 DOA: 05/31/2017 PCP: Prince Solian, MD  Outpatient Specialists:   [email or web address]  Brief Narrative:  47 Crohn's since at least the past 63 years-thinks that she had this in the 90s initially Prior macular degeneration Remote cholecystectomy Rheumatic aortic disease Quantiferon + [cannot be put on Biologics]--treated apparently for 6 months  Admitted with small bowel obstruction 7/19 over the past 2-3 days  Assessment & Plan:   Active Problems:   SBO (small bowel obstruction) (HCC)   Crohn's disease (HCC)   Leukocytosis   Partial SBO  CT abdomen pelvis shows strictures in ileum  Decompressing with NG tube, keep nothing by mouth except for meds  Clerance Lav started by general surgery today 7/20  Greatly appreciate both general surgery/GI input-hopeful for nonoperative management-has had only cholecystectomy in the past when she was in her 9s  Perioperative mortality~ 0.6% if needing surgery from cardiac perspective  Leukocytosis is either reactive or secondary to steroids for Crohn's  Do not suspect sepsis  Acute kidney injury, on admission creatinine 1.3  Continue IV saline 100 cc per hour  Crohn's disease  Solu-Medrol 30 mg every 12 started by GI on 7/20  Her oral budesonide 9 mg is on hold  Prior TIA  Holding Plavix 75, may need rectal aspirin if persisting beyond 7/21  HTN  Holding losartan 50 daily  Hypothyroid  Holding oral Synthroid 100 g, start IV Synthroid 50   Discussed with son on telephone Inpatient MedSurg Await resolution prior to discussion  Consultants:   Gastroenterology and general surgery 7/20  Procedures:   None  Antimicrobials:   None    Subjective: Alert pleasant and slightly uncomfortable Has passed 2 loose stools today passing flatus Not very hungry Pain is manageable As MT 1 canister of NG suction overnight and is one third the way full as of this morning  10 AM  Objective: Vitals:   05/31/17 1854 05/31/17 2137 06/01/17 0457 06/01/17 1321  BP: 125/65 (!) 120/57 113/60 132/70  Pulse: 95 90  88  Resp: 16 16 16 16   Temp: 98 F (36.7 C) 98.8 F (37.1 C) 98.3 F (36.8 C) 97.9 F (36.6 C)  TempSrc: Oral Oral Oral Oral  SpO2: 95% 95% 92% 97%  Weight: 77.1 kg (170 lb)     Height: 5\' 2"  (1.575 m)       Intake/Output Summary (Last 24 hours) at 06/01/17 1635 Last data filed at 06/01/17 1502  Gross per 24 hour  Intake          1833.33 ml  Output              150 ml  Net          1683.33 ml   Filed Weights   05/31/17 1854  Weight: 77.1 kg (170 lb)    Examination:  Dry mucosa no submandibular lymphadenopathy Thyromegaly absent No JVD Abdomen soft nontender nondistended no rebound no distention S1-S2 regular rate rhythm     Data Reviewed: I have personally reviewed following labs and imaging studies  CBC:  Recent Labs Lab 05/31/17 1146 05/31/17 1800 06/01/17 0321  WBC 13.4* 13.6* 9.7  NEUTROABS 10.0*  --   --   HGB 13.2 12.0 10.8*  HCT 40.3 37.5 32.9*  MCV 88.4 88.9 88.9  PLT 203 194 656   Basic Metabolic Panel:  Recent Labs Lab 05/31/17 1146 05/31/17 1800 06/01/17 0321  NA 136  --  138  K 4.3  --  3.9  CL 101  --  107  CO2 23  --  23  GLUCOSE 133*  --  84  BUN 15  --  13  CREATININE 1.30* 1.20* 1.11*  CALCIUM 8.5*  --  7.6*   GFR: Estimated Creatinine Clearance: 36.9 mL/min (A) (by C-G formula based on SCr of 1.11 mg/dL (H)). Liver Function Tests:  Recent Labs Lab 05/31/17 1146  AST 20  ALT 10*  ALKPHOS 83  BILITOT 1.0  PROT 6.7  ALBUMIN 3.4*    Recent Labs Lab 05/31/17 1134  LIPASE 16   No results for input(s): AMMONIA in the last 168 hours. Coagulation Profile: No results for input(s): INR, PROTIME in the last 168 hours. Cardiac Enzymes: No results for input(s): CKTOTAL, CKMB, CKMBINDEX, TROPONINI in the last 168 hours. BNP (last 3 results) No results for input(s): PROBNP in the  last 8760 hours. HbA1C: No results for input(s): HGBA1C in the last 72 hours. CBG: No results for input(s): GLUCAP in the last 168 hours. Lipid Profile: No results for input(s): CHOL, HDL, LDLCALC, TRIG, CHOLHDL, LDLDIRECT in the last 72 hours. Thyroid Function Tests: No results for input(s): TSH, T4TOTAL, FREET4, T3FREE, THYROIDAB in the last 72 hours. Anemia Panel: No results for input(s): VITAMINB12, FOLATE, FERRITIN, TIBC, IRON, RETICCTPCT in the last 72 hours. Urine analysis:    Component Value Date/Time   COLORURINE YELLOW 05/31/2017 1438   APPEARANCEUR CLEAR 05/31/2017 1438   LABSPEC >1.046 (H) 05/31/2017 1438   PHURINE 5.0 05/31/2017 1438   GLUCOSEU NEGATIVE 05/31/2017 1438   HGBUR NEGATIVE 05/31/2017 1438   BILIRUBINUR NEGATIVE 05/31/2017 1438   KETONESUR 5 (A) 05/31/2017 1438   PROTEINUR NEGATIVE 05/31/2017 1438   NITRITE NEGATIVE 05/31/2017 1438   LEUKOCYTESUR NEGATIVE 05/31/2017 1438   Sepsis Labs: @LABRCNTIP (procalcitonin:4,lacticidven:4)  )No results found for this or any previous visit (from the past 240 hour(s)).       Radiology Studies: Ct Abdomen Pelvis W Contrast  Result Date: 05/31/2017 CLINICAL DATA:  Initial evaluation for acute diffuse lower abdominal pain, emesis. History of Crohn's. EXAM: CT ABDOMEN AND PELVIS WITH CONTRAST TECHNIQUE: Multidetector CT imaging of the abdomen and pelvis was performed using the standard protocol following bolus administration of intravenous contrast. CONTRAST:  125mL ISOVUE-300 IOPAMIDOL (ISOVUE-300) INJECTION 61% COMPARISON:  None available. FINDINGS: Lower chest: Scattered atelectatic changes and/or scarring present within the visualized lung bases. Visualized lungs are otherwise clear. No pleural or pericardial effusion. Hepatobiliary: Liver demonstrates a normal contrast enhanced appearance. Gallbladder is absent. No biliary dilatation. Pancreas: Diffuse fatty infiltration of the pancreas noted. Pancreas otherwise  unremarkable without mass lesion or acute inflammatory changes. Spleen: Spleen within normal limits. Adrenals/Urinary Tract: Adrenal glands are normal. Kidneys somewhat atrophic bilateral scattered cortical thinning and scarring, slightly greater on the left. Secondary somewhat irregular nephrograms bilaterally related to scarring. No nephrolithiasis or hydronephrosis. No focal enhancing renal mass. No hydroureter. Bladder largely decompressed without acute abnormality. Stomach/Bowel: Stomach moderately distended with fluid within the gastric lumen. Stomach otherwise unremarkable. Multiple dilated loops of fluid-filled small bowel are seen scattered throughout the abdomen, measuring up to 3.6 cm in diameter. Few scattered internal air-fluid levels. These taper to an area of inflamed appearing ileum in the lower mid pelvis (series 3, image 74), likely secondary to underlying Crohn's disease. Associated stricture may be present. There is a short skip area just distally with a second segment of narrowed ileum with associated wall thickening at the level of the terminal ileum (series 5, image 51). Superimposed rectangular  density within the ileum at this level likely reflects an ingested pill or medication (series 5, image 53). Again, underlying stricture could be present. Adjacent appendix is within normal limits without acute inflammatory changes. Distally, the colon is largely decompressed without acute inflammatory changes. Vascular/Lymphatic: Normal intravascular enhancement seen throughout the intra-abdominal aorta and its branch vessels. Mild aortic atherosclerosis. No adenopathy. Reproductive: Uterus and ovaries within normal limits. Other: No free intraperitoneal air. Small volume free fluid seen adjacent to the liver, likely reactive. Musculoskeletal: No acute osseus abnormality. No worrisome lytic or blastic osseous lesions. IMPRESSION: 1. Multifocal areas of wall thickening with narrowing and/or stricturing  of the ileum, consistent with underlying Crohn's disease. Secondary small bowel obstruction with multiple dilated fluid-filled loops of small bowel proximally. 2. Small volume free fluid within the abdomen, likely reactive. 3. Atrophic kidneys bilaterally with prominent cortical scarring, slightly worse on the left. Electronically Signed   By: Jeannine Boga M.D.   On: 05/31/2017 14:28   Dg Abd 2 Views  Result Date: 06/01/2017 CLINICAL DATA:  Small bowel obstruction.  History of Crohn's disease EXAM: ABDOMEN - 2 VIEW COMPARISON:  May 31, 2017 FINDINGS: Supine and upright images were obtained. Nasogastric tube tip is in the stomach with the side port at the gastroesophageal junction. There are loops of dilated small bowel multiple air-fluid levels in a pattern indicative of bowel obstruction. No free air. There are phleboliths in the pelvis. IMPRESSION: Findings indicative of small bowel obstruction.  No free air. Nasogastric tube tip in stomach with side port at the gastroesophageal junction. Advise advancing nasogastric tube 6-8 cm to insure that both nasogastric tube tip and side port are well within the stomach. Electronically Signed   By: Lowella Grip III M.D.   On: 06/01/2017 08:45   Dg Abd Portable 1 View  Result Date: 05/31/2017 CLINICAL DATA:  NG tube placement. EXAM: PORTABLE ABDOMEN - 1 VIEW COMPARISON:  CT abdomen and pelvis 05/31/2017 FINDINGS: An enteric tube has been placed with tip and side hole projecting over the stomach. There are multiple loops of moderately dilated small bowel as seen on recent CT. Excreted IV contrast is present in both renal collecting systems and in the distal left ureter. No acute osseous abnormality is seen. IMPRESSION: 1. Enteric tube in the stomach. 2. Small bowel dilatation consistent with obstruction as detailed on recent CT. Electronically Signed   By: Logan Bores M.D.   On: 05/31/2017 15:23        Scheduled Meds: . enoxaparin (LOVENOX)  injection  30 mg Subcutaneous Q24H  . methylPREDNISolone (SOLU-MEDROL) injection  30 mg Intravenous Q12H  . pantoprazole (PROTONIX) IV  40 mg Intravenous Q24H   Continuous Infusions:   LOS: 1 day    Time spent: [place], MD [facility] ([phone]   If 7PM-7AM, please contact night-coverage [email or web address] Password [facility] 06/01/2017, 4:35 PM

## 2017-06-01 NOTE — Progress Notes (Signed)
Initial Nutrition Assessment  DOCUMENTATION CODES:   Obesity unspecified  INTERVENTION:   Monitor for diet advancement, provide if advanced:   Ensure Enlive po BID, each supplement provides 350 kcal and 20 grams of protein  NUTRITION DIAGNOSIS:   Inadequate oral intake related to vomiting, nausea as evidenced by per patient/family report GOAL:   Patient will meet greater than or equal to 90% of their needs  MONITOR:   PO intake, Supplement acceptance, Diet advancement  REASON FOR ASSESSMENT:   Malnutrition Screening Tool   ASSESSMENT:   Pt with PMH of Crohn's, hypothyroidism, and TIA. Presents this admission with SBO in the setting of Crohn's.   Pt currently NPO, NGT to suction, with 150 ml output. Pt reports having a loss of appetite for x1 week prior to admission, related to excessive nausea/vomiting. Eager to eat this admission.  Pt reports a UBW of 180 lbs, the last time being at that wt in January. Records are limited with previous wt's, as pt recently relocated to [facility] a few months ago. Suspect pt has lost 10 lb in 6 months, but hard to quantify given pt's wt history recall.  Nutrition-Focused physical exam completed. Findings are mild fat depletion in upper arm, mild muscle depletion in the temple regions, and no edema. Given unspecific wt history and mild fat/muscle depletion, pt does not meet criteria for malnutrition at this time, but is at high risk.   Medications reviewed and include: NS @ 100 ml/hr Labs reviewed.   Diet Order:  Diet NPO time specified  Skin:  Reviewed, no issues  Last BM:  06/01/17  Height:   Ht Readings from Last 1 Encounters:  05/31/17 5\' 2"  (1.575 m)    Weight:   Wt Readings from Last 1 Encounters:  05/31/17 170 lb (77.1 kg)    Ideal Body Weight:  50 kg  BMI:  Body mass index is 31.09 kg/m.  Estimated Nutritional Needs:   Kcal:  1300-1500 (MSJ)  Protein:  75-85 grams (1-1.1 g/kg)  Fluid:  >1.3 L/day   EDUCATION NEEDS:    No education needs identified at this time  [place], LDN Pager # - [phone]

## 2017-06-01 NOTE — Clinical Social Work Note (Addendum)
Clinical Social Work Assessment  Patient Details  Name: Terri [name] MRN: [id number] Date of Birth: 1930/12/19  Date of referral:  06/01/17               Reason for consult:  Transportation                Permission sought to share information with:  Family Supports Permission granted to share information::  Yes, Verbal Permission Granted, Yes, Release of Information Signed  Name::     Producer, television/film/video  Agency::  [employer]  Relationship::  Son  Contact Information:  [phone]  Housing/Transportation Living arrangements for the past 2 months:  [place] of Information:  Patient Patient Interpreter Needed:  None Criminal Activity/Legal Involvement Pertinent to Current Situation/Hospitalization:  No - Comment as needed Significant Relationships:  Adult Children Lives with:  Adult Children (Son-Terri [name]) Do you feel safe going back to the place where you live?  Yes Need for family participation in patient care:  No (Coment)  Care giving concerns:  No care giving concerns identified.    Social Worker assessment / plan:  CSW met with pt to address consult for "Transportation needs; need to get [employer]; I don't drive." CSW introduced herself and explained role of social work. Pt from home and lives with son-Terri [name] and daughter. Pt states son is a Pharmacist, hospital and is available to transport her now that school is out, but will not have the same availability. Pt legally blind and not able to see bus signage or routes on regular city transit. Pt states she already started the [employer] application, but having trouble getting appropriate paperwork completed.   CSW assisted pt in getting Part B of [employer] application completed and signed by MD. [place] faxed to document and pt signed consent to [employer] at [phone]. Fax confirmation confirmed. CSW also provided pt with other transportation resources available to her. Pt thanked CSW. CSW signing off as no further Social Work needs  identified. Please reconsult if new Social Work needs arise.   Employment status:  Retired Passenger transport manager) PT Recommendations:  Not assessed at this time Information / Referral to community resources:  Other (Comment Required) ([employer] and other transportation information)  Patient/Family's Response to care:  Pt please with CSW support and guidance.   Patient/Family's Understanding of and Emotional Response to Diagnosis, Current Treatment, and Prognosis:  Pt expresses understanding of her medical state.   Emotional Assessment Appearance:  Appears stated age Attitude/Demeanor/Rapport:  Other (Appropriate) Affect (typically observed):  Accepting, Adaptable, Pleasant Orientation:  Oriented to Self, Oriented to Place, Oriented to  Time, Oriented to Situation Alcohol / Substance use:  Other Psych involvement (Current and /or in the community):  No (Comment)  Discharge Needs  Concerns to be addressed:  Care Coordination Readmission within the last 30 days:  No Current discharge risk:  Other (Transportation needs) Barriers to Discharge:  Continued Medical Work up   [employer], LCSW 06/01/2017, 5:36 PM

## 2017-06-01 NOTE — Consult Note (Signed)
[facility] Consult  Referring Provider: Dr. Verlon Au Primary Care Physician:  Prince Solian, MD Primary Gastroenterologist: Dr.Magod Reason for Consultation:  Crohn's disease, strictures in ileum, small bowel obstruction  HPI: Terri [name] is a 83 y.o. female was admitted on 05/31/2017 with complaints of abdominal pain, nausea, vomiting and no bowel movement for 3 days. Patient states she was diagnosed with Crohn's disease 8 years ago, as colonoscopy was 6 years ago. She has history of latent tuberculosis, treated with isoniazid for 6 month, and has been considered not a candidate for use of biologic agent such as Humira or Remicade. Her medication so far for Crohn's includes Pentasa and budesonide(recently as an outpatient, he was advised to take 500 mg Pentasa 6 pills a day and budesonide 9 mg a day). Normally she has about 2-3 bowel movements a day, however since the last 3-4 days, he complains of diffuse abdominal pain, multiple episodes of nausea and vomiting. Her son is a pediatrician, and when she complained of vomiting greenish material, he was concerned of small bowel obstruction and advised the patient to go to ER for admission.  Since admission she has had NG tube placement which has caused improvement in symptoms of nausea vomiting and abdominal pain, and today morning she has had 2 loose stools.   Past Medical History:  Diagnosis Date  . Age-related macular degeneration, dry, both eyes   . Aortic insufficiency   . Aortic stenosis   . Arthritis    "hands" (05/31/2017)  . Asthma   . Basal cell carcinoma of nose   . Crohn's disease ([place])   . GERD (gastroesophageal reflux disease)   . History of duodenal ulcer   . History of stomach ulcers   . HOH (hard of hearing)   . Hx of rheumatic fever   . Hypothyroidism   . Positive TB test   . Rheumatic heart disease   . Small bowel obstruction ([place]) 05/31/2017  . Squamous cell cancer of skin of nose   . TIA  (transient ischemic attack)   . TIA (transient ischemic attack) 1999?    Past Surgical History:  Procedure Laterality Date  . BASAL CELL CARCINOMA EXCISION  X 2   "face"  . CARPAL TUNNEL RELEASE Right   . CATARACT EXTRACTION W/ INTRAOCULAR LENS IMPLANT Bilateral   . CHOLECYSTECTOMY    . SQUAMOUS CELL CARCINOMA EXCISION  X1   "face"  . TONSILLECTOMY      Prior to Admission medications   Medication Sig Start Date End Date Taking? Authorizing Provider  Budesonide 9 MG TB24 Take 9 mg by mouth daily. 05/30/17  Yes [provider]  carboxymethylcellulose (REFRESH PLUS) 0.5 % SOLN Place 1 drop into both eyes daily as needed (dry eyes).   Yes [provider]  clopidogrel (PLAVIX) 75 MG tablet Take 75 mg by mouth daily. 04/26/17  Yes [provider]  folic acid (FOLVITE) 1 MG tablet Take 1 mg by mouth daily.   Yes [provider]  levothyroxine (SYNTHROID, LEVOTHROID) 100 MCG tablet Take 100 mcg by mouth daily before breakfast.   Yes [provider]  losartan (COZAAR) 50 MG tablet Take 50 mg by mouth daily. 04/26/17  Yes [provider]  Multiple Vitamin (MULTIVITAMIN WITH MINERALS) TABS tablet Take 1 tablet by mouth daily.   Yes [provider]  oxybutynin (DITROPAN) 5 MG tablet Take 5 mg by mouth daily.   Yes [provider]  pantoprazole (PROTONIX) 40 MG tablet Take 40 mg by  mouth every morning. 05/15/17  Yes [provider]    Current Facility-Administered Medications  Medication Dose Route Frequency Provider Last Rate Last Dose  . 0.9 %  sodium chloride infusion   Intravenous Continuous Dionne Milo, NP 100 mL/hr at 05/31/17 1640    . acetaminophen (TYLENOL) tablet 650 mg  650 mg Oral Q6H PRN Dionne Milo, NP       Or  . acetaminophen (TYLENOL) suppository 650 mg  650 mg Rectal Q6H PRN Dionne Milo, NP      . diatrizoate meglumine-sodium (GASTROGRAFIN) 66-10 % solution 90 mL  90 mL Per NG  tube Once Focht, Jessica L, PA      . enoxaparin (LOVENOX) injection 30 mg  30 mg Subcutaneous Q24H Dionne Milo, NP   30 mg at 05/31/17 2237  . morphine 4 MG/ML injection 1 mg  1 mg Intravenous Q4H PRN Rise Patience, MD   1 mg at 05/31/17 2049  . ondansetron (ZOFRAN) tablet 4 mg  4 mg Oral Q6H PRN Dionne Milo, NP       Or  . ondansetron [facility]) injection 4 mg  4 mg Intravenous Q6H PRN Dionne Milo, NP      . pantoprazole (PROTONIX) injection 40 mg  40 mg Intravenous Q24H Dionne Milo, NP   40 mg at 05/31/17 1750    Allergies as of 05/31/2017  . (No Known Allergies)    History reviewed. No pertinent family history.  Social History   Social History  . Marital status: Widowed    Spouse name: N/A  . Number of children: N/A  . Years of education: N/A   Occupational History  . Not on file.   Social History Main Topics  . Smoking status: Former Smoker    Packs/day: 0.10    Years: 15.00    Types: Cigarettes    Quit date: 1969  . Smokeless tobacco: Never Used  . Alcohol use No  . Drug use: No  . Sexual activity: Not Currently   Other Topics Concern  . Not on file   Social History Narrative   Widowed 12/2016. Living with son. Retired Therapist, sports     Review of Systems: Positive for: GI: Described in detail in HPI.    Gen: Denies any fever, chills, rigors, night sweats, anorexia, fatigue, weakness, malaise, involuntary weight loss, and sleep disorder CV: Denies chest pain, angina, palpitations, syncope, orthopnea, PND, peripheral edema, and claudication. Resp: Denies dyspnea, cough, sputum, wheezing, coughing up blood. GU : Denies urinary burning, blood in urine, urinary frequency, urinary hesitancy, nocturnal urination, and urinary incontinence. MS: Denies joint pain or swelling.  Denies muscle weakness, cramps, atrophy.  Derm: Denies rash, itching, oral ulcerations, hives, unhealing ulcers.  Psych: Denies depression, anxiety, memory loss, suicidal  ideation, hallucinations,  and confusion. Heme: Denies bruising, bleeding, and enlarged lymph nodes. Neuro:  Denies any headaches, dizziness, paresthesias. Endo:  Denies any problems with DM, thyroid, adrenal function.  Physical Exam: Vital signs in last 24 hours: Temp:  [98 F (36.7 C)-98.8 F (37.1 C)] 98.3 F (36.8 C) (07/20 0457) Pulse Rate:  [76-102] 90 (07/19 2137) Resp:  [9-26] 16 (07/20 0457) BP: (105-141)/(57-90) 113/60 (07/20 0457) SpO2:  [92 %-98 %] 92 % (07/20 0457) Weight:  [77.1 kg (170 lb)] 77.1 kg (170 lb) (07/19 1854) Last BM Date: 06/01/17  General:   Alert,  Well-developed, well-nourished, pleasant and cooperative in NAD, NG tube draining light green material so far 200 mL  in the canister Head:  Normocephalic and atraumatic., No signs of dehydration Eyes:  Sclera clear, no icterus.   Conjunctiva pink. Ears:  Normal auditory acuity. Nose:  No deformity, discharge,  or lesions. Mouth:  No deformity or lesions.  Oropharynx pink & moist. Neck:  Supple; no masses or thyromegaly. Lungs:  Clear throughout to auscultation.   No wheezes, crackles, or rhonchi. No acute distress. Heart:  Regular rate and rhythm; no murmurs, clicks, rubs,  or gallops. Extremities:  Without clubbing or edema. Neurologic:  Alert and  oriented x4;  grossly normal neurologically. Skin:  Intact without significant lesions or rashes. Psych:  Alert and cooperative. Normal mood and affect. Abdomen:  Soft, nontender and nondistended. No masses, hepatosplenomegaly or hernias noted. Sluggish but audible bowel sounds, without guarding, and without rebound.         Lab Results:  Recent Labs  05/31/17 1146 05/31/17 1800 06/01/17 0321  WBC 13.4* 13.6* 9.7  HGB 13.2 12.0 10.8*  HCT 40.3 37.5 32.9*  PLT 203 194 175   BMET  Recent Labs  05/31/17 1146 05/31/17 1800 06/01/17 0321  NA 136  --  138  K 4.3  --  3.9  CL 101  --  107  CO2 23  --  23  GLUCOSE 133*  --  84  BUN 15  --  13   CREATININE 1.30* 1.20* 1.11*  CALCIUM 8.5*  --  7.6*   LFT  Recent Labs  05/31/17 1146  PROT 6.7  ALBUMIN 3.4*  AST 20  ALT 10*  ALKPHOS 83  BILITOT 1.0   PT/INR No results for input(s): LABPROT, INR in the last 72 hours.  Studies/Results: Ct Abdomen Pelvis W Contrast  Result Date: 05/31/2017 CLINICAL DATA:  Initial evaluation for acute diffuse lower abdominal pain, emesis. History of Crohn's. EXAM: CT ABDOMEN AND PELVIS WITH CONTRAST TECHNIQUE: Multidetector CT imaging of the abdomen and pelvis was performed using the standard protocol following bolus administration of intravenous contrast. CONTRAST:  162mL ISOVUE-300 IOPAMIDOL (ISOVUE-300) INJECTION 61% COMPARISON:  None available. FINDINGS: Lower chest: Scattered atelectatic changes and/or scarring present within the visualized lung bases. Visualized lungs are otherwise clear. No pleural or pericardial effusion. Hepatobiliary: Liver demonstrates a normal contrast enhanced appearance. Gallbladder is absent. No biliary dilatation. Pancreas: Diffuse fatty infiltration of the pancreas noted. Pancreas otherwise unremarkable without mass lesion or acute inflammatory changes. Spleen: Spleen within normal limits. Adrenals/Urinary Tract: Adrenal glands are normal. Kidneys somewhat atrophic bilateral scattered cortical thinning and scarring, slightly greater on the left. Secondary somewhat irregular nephrograms bilaterally related to scarring. No nephrolithiasis or hydronephrosis. No focal enhancing renal mass. No hydroureter. Bladder largely decompressed without acute abnormality. Stomach/Bowel: Stomach moderately distended with fluid within the gastric lumen. Stomach otherwise unremarkable. Multiple dilated loops of fluid-filled small bowel are seen scattered throughout the abdomen, measuring up to 3.6 cm in diameter. Few scattered internal air-fluid levels. These taper to an area of inflamed appearing ileum in the lower mid pelvis (series 3, image  74), likely secondary to underlying Crohn's disease. Associated stricture may be present. There is a short skip area just distally with a second segment of narrowed ileum with associated wall thickening at the level of the terminal ileum (series 5, image 51). Superimposed rectangular density within the ileum at this level likely reflects an ingested pill or medication (series 5, image 53). Again, underlying stricture could be present. Adjacent appendix is within normal limits without acute inflammatory changes. Distally, the colon is largely decompressed without  acute inflammatory changes. Vascular/Lymphatic: Normal intravascular enhancement seen throughout the intra-abdominal aorta and its branch vessels. Mild aortic atherosclerosis. No adenopathy. Reproductive: Uterus and ovaries within normal limits. Other: No free intraperitoneal air. Small volume free fluid seen adjacent to the liver, likely reactive. Musculoskeletal: No acute osseus abnormality. No worrisome lytic or blastic osseous lesions. IMPRESSION: 1. Multifocal areas of wall thickening with narrowing and/or stricturing of the ileum, consistent with underlying Crohn's disease. Secondary small bowel obstruction with multiple dilated fluid-filled loops of small bowel proximally. 2. Small volume free fluid within the abdomen, likely reactive. 3. Atrophic kidneys bilaterally with prominent cortical scarring, slightly worse on the left. Electronically Signed   By: Jeannine Boga M.D.   On: 05/31/2017 14:28   Dg Abd 2 Views  Result Date: 06/01/2017 CLINICAL DATA:  Small bowel obstruction.  History of Crohn's disease EXAM: ABDOMEN - 2 VIEW COMPARISON:  May 31, 2017 FINDINGS: Supine and upright images were obtained. Nasogastric tube tip is in the stomach with the side port at the gastroesophageal junction. There are loops of dilated small bowel multiple air-fluid levels in a pattern indicative of bowel obstruction. No free air. There are phleboliths in  the pelvis. IMPRESSION: Findings indicative of small bowel obstruction.  No free air. Nasogastric tube tip in stomach with side port at the gastroesophageal junction. Advise advancing nasogastric tube 6-8 cm to insure that both nasogastric tube tip and side port are well within the stomach. Electronically Signed   By: Lowella Grip III M.D.   On: 06/01/2017 08:45   Dg Abd Portable 1 View  Result Date: 05/31/2017 CLINICAL DATA:  NG tube placement. EXAM: PORTABLE ABDOMEN - 1 VIEW COMPARISON:  CT abdomen and pelvis 05/31/2017 FINDINGS: An enteric tube has been placed with tip and side hole projecting over the stomach. There are multiple loops of moderately dilated small bowel as seen on recent CT. Excreted IV contrast is present in both renal collecting systems and in the distal left ureter. No acute osseous abnormality is seen. IMPRESSION: 1. Enteric tube in the stomach. 2. Small bowel dilatation consistent with obstruction as detailed on recent CT. Electronically Signed   By: Logan Bores M.D.   On: 05/31/2017 15:23    Impression: 1. Crohn's disease, small bowel obstruction, CT from 05/31/17 showed multiple dilated loops of fluid-filled small bowel scattered throughout the abdomen. Inflamed ileum, associated stricture, short skip area distantly, second narrowed segment along with wall thickening in terminal ileum, underlying stricture could be present. : Largely decompressed without acute inflammatory change.  Plan: 1. We will start patient on methylprednisolone 30 mg IV every 12 hours. 2. Agree with NG tube placement to low intermittent suction for decompression  Discussed with patient regarding possibility of surgical intervention, if there is no improvement in small bowel obstruction as she does have an underlying stricture. I am hopeful, that with use of IV steroids, and NG tube for decompression, she will do better, and surgery can hopefully be avoided.   LOS: 1 day   Gari Crown 06/01/2017, 12:13 PM  Pager [phone] If no answer or after 5 PM call [phone]

## 2017-06-01 NOTE — Consult Note (Signed)
[facility] Consult/Admission Note  Terri [name] Apr 17, 1931  [id number].    Requesting MD: Dr. Verlon Au Chief Complaint/Reason for Consult: SBO  HPI:   Pt is a 83yo female recently relocated to [place] to live with son after husband passed away in 01-22-23. PMH of Crohn's, TIA, macular degeneration, HOH, remote cholecystectomy, rheumatic fever who presented to ED with 2 day hx of abdominal pain and vomiting. Pt is on Mesalazine chronically and started Budesonide recently for suspected Crohns flare. Pt is followed by Dr. Watt Climes, GI. She states she has not had a chron's flare that required hospitalization in roughly 8 years. She has little flares that she treats herself with not eating until symptoms resolve then slowly advancing her diet. This time she states severe, sharp, constant, abdominal pain, that did not radiate with associated green emesis. Pt was having flatus during the vomiting. She had 2 BM's today that were loose and watery. Abdominal pain and bloating has resolved. She is having intermitted mild abdominal pains when the NGT is sucking.   ED Course:  Labs: Creatinine 1.11, GFR 45, Hg 10.8 CT abd: Multifocal areas of wall thickening with narrowing and/or stricturing of the ileum, consistent with underlying Crohn's disease. Secondary small bowel obstruction with multiple dilated fluid-filled loops of small bowel proximally. Small volume free fluid within the abdomen, likely reactive. Atrophic kidneys bilaterally with prominent cortical scarring, slightly worse on the left.   ROS:  Review of Systems  Constitutional: Positive for chills. Negative for fever.  Respiratory: Negative for shortness of breath.   Cardiovascular: Negative for chest pain.  Gastrointestinal: Positive for abdominal pain, diarrhea, nausea and vomiting. Negative for blood in stool.  Skin: Negative for rash.  Neurological: Negative for dizziness and loss of consciousness.  All other systems reviewed  and are negative.    History reviewed. No pertinent family history.  Past Medical History:  Diagnosis Date  . Age-related macular degeneration, dry, both eyes   . Aortic insufficiency   . Aortic stenosis   . Arthritis    "hands" (05/31/2017)  . Asthma   . Basal cell carcinoma of nose   . Crohn's disease ([place])   . GERD (gastroesophageal reflux disease)   . History of duodenal ulcer   . History of stomach ulcers   . HOH (hard of hearing)   . Hx of rheumatic fever   . Hypothyroidism   . Positive TB test   . Rheumatic heart disease   . Small bowel obstruction ([place]) 05/31/2017  . Squamous cell cancer of skin of nose   . TIA (transient ischemic attack)   . TIA (transient ischemic attack) 1999?    Past Surgical History:  Procedure Laterality Date  . BASAL CELL CARCINOMA EXCISION  X 2   "face"  . CARPAL TUNNEL RELEASE Right   . CATARACT EXTRACTION W/ INTRAOCULAR LENS IMPLANT Bilateral   . CHOLECYSTECTOMY    . SQUAMOUS CELL CARCINOMA EXCISION  X1   "face"  . TONSILLECTOMY      Social History:  reports that she quit smoking about 49 years ago. Her smoking use included Cigarettes. She has a 1.50 pack-year smoking history. She has never used smokeless tobacco. She reports that she does not drink alcohol or use drugs.  Allergies: No Known Allergies  Medications Prior to Admission  Medication Sig Dispense Refill  . Budesonide 9 MG TB24 Take 9 mg by mouth daily.  0  . carboxymethylcellulose (REFRESH PLUS) 0.5 % SOLN Place 1 drop into both eyes daily  as needed (dry eyes).    . clopidogrel (PLAVIX) 75 MG tablet Take 75 mg by mouth daily.  1  . folic acid (FOLVITE) 1 MG tablet Take 1 mg by mouth daily.    Marland Kitchen levothyroxine (SYNTHROID, LEVOTHROID) 100 MCG tablet Take 100 mcg by mouth daily before breakfast.    . losartan (COZAAR) 50 MG tablet Take 50 mg by mouth daily.  1  . Multiple Vitamin (MULTIVITAMIN WITH MINERALS) TABS tablet Take 1 tablet by mouth daily.    Marland Kitchen oxybutynin  (DITROPAN) 5 MG tablet Take 5 mg by mouth daily.    . pantoprazole (PROTONIX) 40 MG tablet Take 40 mg by mouth every morning.  0    Blood pressure 113/60, pulse 90, temperature 98.3 F (36.8 C), temperature source Oral, resp. rate 16, height 5' 2"  (1.575 m), weight 170 lb (77.1 kg), SpO2 92 %.  Physical Exam  Constitutional: She is oriented to person, place, and time and well-developed, well-nourished, and in no distress. Vital signs are normal. No distress.  Well appearing, sitting comfortably in the chair  HENT:  Head: Normocephalic and atraumatic.  Nose: Nose normal.  Eyes: Conjunctivae are normal. Right eye exhibits no discharge. Left eye exhibits no discharge. No scleral icterus.  Pupils equal  Neck: Normal range of motion. Neck supple. No tracheal deviation present. No thyromegaly present.  Cardiovascular: Normal rate, regular rhythm, normal heart sounds and intact distal pulses.  Exam reveals no gallop and no friction rub.   No murmur heard. Pulses:      Radial pulses are 2+ on the right side, and 2+ on the left side.  Pulmonary/Chest: Effort normal and breath sounds normal. No respiratory distress. She has no decreased breath sounds. She has no wheezes. She has no rhonchi. She has no rales.  Abdominal: Soft. Normal appearance. She exhibits no distension and no mass. There is no tenderness. There is no rebound and no guarding. No hernia.  Normal bowel sounds mixed with tinkling bowel sounds  Musculoskeletal: Normal range of motion. She exhibits no edema or deformity.  Lymphadenopathy:    She has no cervical adenopathy.  Neurological: She is alert and oriented to person, place, and time. GCS score is 15.  Skin: Skin is warm and dry. No rash noted. She is not diaphoretic.  Psychiatric: Mood and affect normal.  Nursing note and vitals reviewed.   Results for orders placed or performed during the hospital encounter of 05/31/17 (from the past 48 hour(s))  Lipase, blood     Status:  None   Collection Time: 05/31/17 11:34 AM  Result Value Ref Range   Lipase 16 11 - 51 U/L  CBC with Differential     Status: Abnormal   Collection Time: 05/31/17 11:46 AM  Result Value Ref Range   WBC 13.4 (H) 4.0 - 10.5 K/uL   RBC 4.56 3.87 - 5.11 MIL/uL   Hemoglobin 13.2 12.0 - 15.0 g/dL   HCT 40.3 36.0 - 46.0 %   MCV 88.4 78.0 - 100.0 fL   MCH 28.9 26.0 - 34.0 pg   MCHC 32.8 30.0 - 36.0 g/dL   RDW 14.9 11.5 - 15.5 %   Platelets 203 150 - 400 K/uL   Neutrophils Relative % 74 %   Neutro Abs 10.0 (H) 1.7 - 7.7 K/uL   Lymphocytes Relative 18 %   Lymphs Abs 2.4 0.7 - 4.0 K/uL   Monocytes Relative 8 %   Monocytes Absolute 1.0 0.1 - 1.0 K/uL   Eosinophils  Relative 0 %   Eosinophils Absolute 0.0 0.0 - 0.7 K/uL   Basophils Relative 0 %   Basophils Absolute 0.0 0.0 - 0.1 K/uL  Comprehensive metabolic panel     Status: Abnormal   Collection Time: 05/31/17 11:46 AM  Result Value Ref Range   Sodium 136 135 - 145 mmol/L   Potassium 4.3 3.5 - 5.1 mmol/L   Chloride 101 101 - 111 mmol/L   CO2 23 22 - 32 mmol/L   Glucose, Bld 133 (H) 65 - 99 mg/dL   BUN 15 6 - 20 mg/dL   Creatinine, Ser 1.30 (H) 0.44 - 1.00 mg/dL   Calcium 8.5 (L) 8.9 - 10.3 mg/dL   Total Protein 6.7 6.5 - 8.1 g/dL   Albumin 3.4 (L) 3.5 - 5.0 g/dL   AST 20 15 - 41 U/L   ALT 10 (L) 14 - 54 U/L   Alkaline Phosphatase 83 38 - 126 U/L   Total Bilirubin 1.0 0.3 - 1.2 mg/dL   GFR calc non Af Amer 37 (L) >60 mL/min   GFR calc Af Amer 43 (L) >60 mL/min    Comment: (NOTE) The eGFR has been calculated using the CKD EPI equation. This calculation has not been validated in all clinical situations. eGFR's persistently <60 mL/min signify possible Chronic Kidney Disease.    Anion gap 12 5 - 15  Urinalysis, Routine w reflex microscopic     Status: Abnormal   Collection Time: 05/31/17  2:38 PM  Result Value Ref Range   Color, Urine YELLOW YELLOW   APPearance CLEAR CLEAR   Specific Gravity, Urine >1.046 (H) 1.005 - 1.030    pH 5.0 5.0 - 8.0   Glucose, UA NEGATIVE NEGATIVE mg/dL   Hgb urine dipstick NEGATIVE NEGATIVE   Bilirubin Urine NEGATIVE NEGATIVE   Ketones, ur 5 (A) NEGATIVE mg/dL   Protein, ur NEGATIVE NEGATIVE mg/dL   Nitrite NEGATIVE NEGATIVE   Leukocytes, UA NEGATIVE NEGATIVE  CBC     Status: Abnormal   Collection Time: 05/31/17  6:00 PM  Result Value Ref Range   WBC 13.6 (H) 4.0 - 10.5 K/uL   RBC 4.22 3.87 - 5.11 MIL/uL   Hemoglobin 12.0 12.0 - 15.0 g/dL   HCT 37.5 36.0 - 46.0 %   MCV 88.9 78.0 - 100.0 fL   MCH 28.4 26.0 - 34.0 pg   MCHC 32.0 30.0 - 36.0 g/dL   RDW 15.0 11.5 - 15.5 %   Platelets 194 150 - 400 K/uL  Creatinine, serum     Status: Abnormal   Collection Time: 05/31/17  6:00 PM  Result Value Ref Range   Creatinine, Ser 1.20 (H) 0.44 - 1.00 mg/dL   GFR calc non Af Amer 41 (L) >60 mL/min   GFR calc Af Amer 47 (L) >60 mL/min    Comment: (NOTE) The eGFR has been calculated using the CKD EPI equation. This calculation has not been validated in all clinical situations. eGFR's persistently <60 mL/min signify possible Chronic Kidney Disease.   CBC     Status: Abnormal   Collection Time: 06/01/17  3:21 AM  Result Value Ref Range   WBC 9.7 4.0 - 10.5 K/uL   RBC 3.70 (L) 3.87 - 5.11 MIL/uL   Hemoglobin 10.8 (L) 12.0 - 15.0 g/dL   HCT 32.9 (L) 36.0 - 46.0 %   MCV 88.9 78.0 - 100.0 fL   MCH 29.2 26.0 - 34.0 pg   MCHC 32.8 30.0 - 36.0 g/dL   RDW  15.4 11.5 - 15.5 %   Platelets 175 150 - 400 K/uL  Basic metabolic panel     Status: Abnormal   Collection Time: 06/01/17  3:21 AM  Result Value Ref Range   Sodium 138 135 - 145 mmol/L   Potassium 3.9 3.5 - 5.1 mmol/L   Chloride 107 101 - 111 mmol/L   CO2 23 22 - 32 mmol/L   Glucose, Bld 84 65 - 99 mg/dL   BUN 13 6 - 20 mg/dL   Creatinine, Ser 1.11 (H) 0.44 - 1.00 mg/dL   Calcium 7.6 (L) 8.9 - 10.3 mg/dL   GFR calc non Af Amer 45 (L) >60 mL/min   GFR calc Af Amer 52 (L) >60 mL/min    Comment: (NOTE) The eGFR has been calculated  using the CKD EPI equation. This calculation has not been validated in all clinical situations. eGFR's persistently <60 mL/min signify possible Chronic Kidney Disease.    Anion gap 8 5 - 15   Ct Abdomen Pelvis W Contrast  Result Date: 05/31/2017 CLINICAL DATA:  Initial evaluation for acute diffuse lower abdominal pain, emesis. History of Crohn's. EXAM: CT ABDOMEN AND PELVIS WITH CONTRAST TECHNIQUE: Multidetector CT imaging of the abdomen and pelvis was performed using the standard protocol following bolus administration of intravenous contrast. CONTRAST:  123m ISOVUE-300 IOPAMIDOL (ISOVUE-300) INJECTION 61% COMPARISON:  None available. FINDINGS: Lower chest: Scattered atelectatic changes and/or scarring present within the visualized lung bases. Visualized lungs are otherwise clear. No pleural or pericardial effusion. Hepatobiliary: Liver demonstrates a normal contrast enhanced appearance. Gallbladder is absent. No biliary dilatation. Pancreas: Diffuse fatty infiltration of the pancreas noted. Pancreas otherwise unremarkable without mass lesion or acute inflammatory changes. Spleen: Spleen within normal limits. Adrenals/Urinary Tract: Adrenal glands are normal. Kidneys somewhat atrophic bilateral scattered cortical thinning and scarring, slightly greater on the left. Secondary somewhat irregular nephrograms bilaterally related to scarring. No nephrolithiasis or hydronephrosis. No focal enhancing renal mass. No hydroureter. Bladder largely decompressed without acute abnormality. Stomach/Bowel: Stomach moderately distended with fluid within the gastric lumen. Stomach otherwise unremarkable. Multiple dilated loops of fluid-filled small bowel are seen scattered throughout the abdomen, measuring up to 3.6 cm in diameter. Few scattered internal air-fluid levels. These taper to an area of inflamed appearing ileum in the lower mid pelvis (series 3, image 74), likely secondary to underlying Crohn's disease.  Associated stricture may be present. There is a short skip area just distally with a second segment of narrowed ileum with associated wall thickening at the level of the terminal ileum (series 5, image 51). Superimposed rectangular density within the ileum at this level likely reflects an ingested pill or medication (series 5, image 53). Again, underlying stricture could be present. Adjacent appendix is within normal limits without acute inflammatory changes. Distally, the colon is largely decompressed without acute inflammatory changes. Vascular/Lymphatic: Normal intravascular enhancement seen throughout the intra-abdominal aorta and its branch vessels. Mild aortic atherosclerosis. No adenopathy. Reproductive: Uterus and ovaries within normal limits. Other: No free intraperitoneal air. Small volume free fluid seen adjacent to the liver, likely reactive. Musculoskeletal: No acute osseus abnormality. No worrisome lytic or blastic osseous lesions. IMPRESSION: 1. Multifocal areas of wall thickening with narrowing and/or stricturing of the ileum, consistent with underlying Crohn's disease. Secondary small bowel obstruction with multiple dilated fluid-filled loops of small bowel proximally. 2. Small volume free fluid within the abdomen, likely reactive. 3. Atrophic kidneys bilaterally with prominent cortical scarring, slightly worse on the left. Electronically Signed   By: BMarland Kitchen  Jeannine Boga M.D.   On: 05/31/2017 14:28   Dg Abd 2 Views  Result Date: 06/01/2017 CLINICAL DATA:  Small bowel obstruction.  History of Crohn's disease EXAM: ABDOMEN - 2 VIEW COMPARISON:  May 31, 2017 FINDINGS: Supine and upright images were obtained. Nasogastric tube tip is in the stomach with the side port at the gastroesophageal junction. There are loops of dilated small bowel multiple air-fluid levels in a pattern indicative of bowel obstruction. No free air. There are phleboliths in the pelvis. IMPRESSION: Findings indicative of small  bowel obstruction.  No free air. Nasogastric tube tip in stomach with side port at the gastroesophageal junction. Advise advancing nasogastric tube 6-8 cm to insure that both nasogastric tube tip and side port are well within the stomach. Electronically Signed   By: Lowella Grip III M.D.   On: 06/01/2017 08:45   Dg Abd Portable 1 View  Result Date: 05/31/2017 CLINICAL DATA:  NG tube placement. EXAM: PORTABLE ABDOMEN - 1 VIEW COMPARISON:  CT abdomen and pelvis 05/31/2017 FINDINGS: An enteric tube has been placed with tip and side hole projecting over the stomach. There are multiple loops of moderately dilated small bowel as seen on recent CT. Excreted IV contrast is present in both renal collecting systems and in the distal left ureter. No acute osseous abnormality is seen. IMPRESSION: 1. Enteric tube in the stomach. 2. Small bowel dilatation consistent with obstruction as detailed on recent CT. Electronically Signed   By: Logan Bores M.D.   On: 05/31/2017 15:23      Assessment/Plan  Partial SBO likely due to Chron's flare - had 2 BM's today and NGT output down  - CT scan shows inflammation at the ileus, pt states the illeum is where inflammation has been seen in previous flares - SBO protocol with gastrofin with 8 hour delay film - recommend GI get involved  We will continue to follow this pt. Thank you for the consult.   Kalman Drape, [facility] 06/01/2017, 10:15 AM Pager: [phone] Consults: [phone] Mon-Fri 7:00 am-4:30 pm Sat-Sun 7:00 am-11:30 am

## 2017-06-02 ENCOUNTER — Inpatient Hospital Stay (HOSPITAL_COMMUNITY): Payer: Medicare Other

## 2017-06-02 DIAGNOSIS — K50012 Crohn's disease of small intestine with intestinal obstruction: Principal | ICD-10-CM

## 2017-06-02 LAB — CBC
HEMATOCRIT: 33.8 % — AB (ref 36.0–46.0)
HEMOGLOBIN: 10.5 g/dL — AB (ref 12.0–15.0)
MCH: 27.9 pg (ref 26.0–34.0)
MCHC: 31.1 g/dL (ref 30.0–36.0)
MCV: 89.9 fL (ref 78.0–100.0)
Platelets: 176 10*3/uL (ref 150–400)
RBC: 3.76 MIL/uL — ABNORMAL LOW (ref 3.87–5.11)
RDW: 15 % (ref 11.5–15.5)
WBC: 7.6 10*3/uL (ref 4.0–10.5)

## 2017-06-02 MED ORDER — CLOPIDOGREL BISULFATE 75 MG PO TABS
75.0000 mg | ORAL_TABLET | Freq: Every day | ORAL | Status: DC
  Administered 2017-06-02 – 2017-06-03 (×2): 75 mg via ORAL
  Filled 2017-06-02 (×2): qty 1

## 2017-06-02 MED ORDER — LEVOTHYROXINE SODIUM 100 MCG PO TABS
100.0000 ug | ORAL_TABLET | Freq: Every day | ORAL | Status: DC
  Administered 2017-06-03: 100 ug via ORAL
  Filled 2017-06-02: qty 1

## 2017-06-02 MED ORDER — FOLIC ACID 1 MG PO TABS
1.0000 mg | ORAL_TABLET | Freq: Every day | ORAL | Status: DC
  Administered 2017-06-02 – 2017-06-03 (×2): 1 mg via ORAL
  Filled 2017-06-02 (×2): qty 1

## 2017-06-02 MED ORDER — OXYBUTYNIN CHLORIDE 5 MG PO TABS
5.0000 mg | ORAL_TABLET | Freq: Every day | ORAL | Status: DC
  Administered 2017-06-02 – 2017-06-03 (×2): 5 mg via ORAL
  Filled 2017-06-02 (×2): qty 1

## 2017-06-02 MED ORDER — PANTOPRAZOLE SODIUM 40 MG PO TBEC
40.0000 mg | DELAYED_RELEASE_TABLET | ORAL | Status: DC
  Administered 2017-06-02 – 2017-06-03 (×2): 40 mg via ORAL
  Filled 2017-06-02 (×2): qty 1

## 2017-06-02 NOTE — Progress Notes (Signed)
PROGRESS NOTE    Terri [name]  ZJI:[id number] DOB: [birth year] DOA: 05/31/2017 PCP: Prince Solian, MD  Outpatient Specialists:   [email or web address]  Brief Narrative:   73 Crohn's since at least the past 10 years-thinks that she had this in the 90s initially Prior macular degeneration Remote cholecystectomy Rheumatic aortic disease Quantiferon + [cannot be put on Biologics]--treated apparently for 6 months INH 5 yr prior  Patient tells me her and Dr. Watt Climes felt she could go on Biologics? Admitted with small bowel obstruction 7/19 over the past 2-3 days  Assessment & Plan:   Active Problems:   SBO (small bowel obstruction) (HCC)   Crohn's disease (HCC)   Leukocytosis   Partial SBO  CT abdomen pelvis on admit= strictures in ileum  Gastrograph started by general surgery today 7/20--Constrast through to colon--NG removed 7/21 am  Sips of clears as per gen surg  Resuming home meds  Leukocytosis is either reactive from SBO or secondary to steroids for Crohn's  Do not suspect sepsis, nor infection and has resolved on its own  Acute kidney injury, on admission creatinine 1.3  Continue IV saline 100 cc per hour-->50 cc/h  Rpt bmet am  Crohn's disease  Solu-Medrol 30 mg every 12 started by GI on 7/20  Her oral budesonide 9 mg is on hold  I have held her Pentasa --defer to gi either taper of steroid vs re-initiation of pentasa or both  Prior TIA  Resumed  Plavix 75 7/21 as tol sips   HTN-uncontrolled  resuming losartan 50 daily  Hypothyroid  resume oral Synthroid 100 g  Legally blind-mac degeneration  Stable  Rheumatic AoS  OP follow up   Discussed with son and dil at [place] Await resolution prior to discussion  Consultants:   Gastroenterology and general surgery 7/20  Procedures:   None  Antimicrobials:   None    Subjective:  Passing 2 loose large stool gen surg has advanced diet Ng tube out No other co no fever    Has been oob  Objective: Vitals:   06/01/17 0457 06/01/17 1321 06/01/17 2059 06/02/17 0419  BP: 113/60 132/70 (!) 135/58 (!) 142/64  Pulse:  88 91 77  Resp: _0 Temp: 98.3 F (36.8 C) 97.9 F (36.6 C) 98.2 F (36.8 C) 97.7 F (36.5 C)  TempSrc: Oral Oral Oral Oral  SpO2: 92% 97% 94% 94%  Weight:      Height:        Intake/Output Summary (Last 24 hours) at 06/02/17 0928 Last data filed at 06/02/17 0600  Gross per 24 hour  Intake          1291.67 ml  Output              302 ml  Net           989.67 ml   Filed Weights   05/31/17 1854  Weight: 77.1 kg (170 lb)    Examination:  no submandibular lymphadenopathy No pallor no ict, pleasant No JVD Abdomen soft nontender nondistended no rebound no distention S1-S2 regular rate rhythm No le edema     Data Reviewed: I have personally reviewed following labs and imaging studies  CBC:  Recent Labs Lab 05/31/17 1146 05/31/17 1800 06/01/17 0321 06/02/17 0313  WBC 13.4* 13.6* 9.7 7.6  NEUTROABS 10.0*  --   --   --   HGB 13.2 12.0 10.8* 10.5*  HCT 40.3 37.5 32.9* 33.8*  MCV 88.4 88.9 88.9 89.9  PLT 203 194 175 765   Basic Metabolic Panel:  Recent Labs Lab 05/31/17 1146 05/31/17 1800 06/01/17 0321  NA 136  --  138  K 4.3  --  3.9  CL 101  --  107  CO2 23  --  23  GLUCOSE 133*  --  84  BUN 15  --  13  CREATININE 1.30* 1.20* 1.11*  CALCIUM 8.5*  --  7.6*   GFR: Estimated Creatinine Clearance: 36.9 mL/min (A) (by C-G formula based on SCr of 1.11 mg/dL (H)). Liver Function Tests:  Recent Labs Lab 05/31/17 1146  AST 20  ALT 10*  ALKPHOS 83  BILITOT 1.0  PROT 6.7  ALBUMIN 3.4*    Recent Labs Lab 05/31/17 1134  LIPASE 16   No results for input(s): AMMONIA in the last 168 hours. Coagulation Profile: No results for input(s): INR, PROTIME in the last 168 hours. Cardiac Enzymes: No results for input(s): CKTOTAL, CKMB, CKMBINDEX, TROPONINI in the last 168 hours. BNP (last 3 results) No  results for input(s): PROBNP in the last 8760 hours. HbA1C: No results for input(s): HGBA1C in the last 72 hours. CBG: No results for input(s): GLUCAP in the last 168 hours. Lipid Profile: No results for input(s): CHOL, HDL, LDLCALC, TRIG, CHOLHDL, LDLDIRECT in the last 72 hours. Thyroid Function Tests: No results for input(s): TSH, T4TOTAL, FREET4, T3FREE, THYROIDAB in the last 72 hours. Anemia Panel: No results for input(s): VITAMINB12, FOLATE, FERRITIN, TIBC, IRON, RETICCTPCT in the last 72 hours. Urine analysis:    Component Value Date/Time   COLORURINE YELLOW 05/31/2017 1438   APPEARANCEUR CLEAR 05/31/2017 1438   LABSPEC >1.046 (H) 05/31/2017 1438   PHURINE 5.0 05/31/2017 1438   GLUCOSEU NEGATIVE 05/31/2017 1438   HGBUR NEGATIVE 05/31/2017 1438   BILIRUBINUR NEGATIVE 05/31/2017 1438   KETONESUR 5 (A) 05/31/2017 1438   PROTEINUR NEGATIVE 05/31/2017 1438   NITRITE NEGATIVE 05/31/2017 1438   LEUKOCYTESUR NEGATIVE 05/31/2017 1438   Sepsis Labs: _0 (procalcitonin:4,lacticidven:4)  )No results found for this or any previous visit (from the past 240 hour(s)).       Radiology Studies: Ct Abdomen Pelvis W Contrast  Result Date: 05/31/2017 CLINICAL DATA:  Initial evaluation for acute diffuse lower abdominal pain, emesis. History of Crohn's. EXAM: CT ABDOMEN AND PELVIS WITH CONTRAST TECHNIQUE: Multidetector CT imaging of the abdomen and pelvis was performed using the standard protocol following bolus administration of intravenous contrast. CONTRAST:  133m ISOVUE-300 IOPAMIDOL (ISOVUE-300) INJECTION 61% COMPARISON:  None available. FINDINGS: Lower chest: Scattered atelectatic changes and/or scarring present within the visualized lung bases. Visualized lungs are otherwise clear. No pleural or pericardial effusion. Hepatobiliary: Liver demonstrates a normal contrast enhanced appearance. Gallbladder is absent. No biliary dilatation. Pancreas: Diffuse fatty infiltration of the  pancreas noted. Pancreas otherwise unremarkable without mass lesion or acute inflammatory changes. Spleen: Spleen within normal limits. Adrenals/Urinary Tract: Adrenal glands are normal. Kidneys somewhat atrophic bilateral scattered cortical thinning and scarring, slightly greater on the left. Secondary somewhat irregular nephrograms bilaterally related to scarring. No nephrolithiasis or hydronephrosis. No focal enhancing renal mass. No hydroureter. Bladder largely decompressed without acute abnormality. Stomach/Bowel: Stomach moderately distended with fluid within the gastric lumen. Stomach otherwise unremarkable. Multiple dilated loops of fluid-filled small bowel are seen scattered throughout the abdomen, measuring up to 3.6 cm in diameter. Few scattered internal air-fluid levels. These taper to an area of inflamed appearing ileum in the lower mid pelvis (series 3, image 74), likely secondary to underlying Crohn's disease. Associated stricture may be  present. There is a short skip area just distally with a second segment of narrowed ileum with associated wall thickening at the level of the terminal ileum (series 5, image 51). Superimposed rectangular density within the ileum at this level likely reflects an ingested pill or medication (series 5, image 53). Again, underlying stricture could be present. Adjacent appendix is within normal limits without acute inflammatory changes. Distally, the colon is largely decompressed without acute inflammatory changes. Vascular/Lymphatic: Normal intravascular enhancement seen throughout the intra-abdominal aorta and its branch vessels. Mild aortic atherosclerosis. No adenopathy. Reproductive: Uterus and ovaries within normal limits. Other: No free intraperitoneal air. Small volume free fluid seen adjacent to the liver, likely reactive. Musculoskeletal: No acute osseus abnormality. No worrisome lytic or blastic osseous lesions. IMPRESSION: 1. Multifocal areas of wall thickening  with narrowing and/or stricturing of the ileum, consistent with underlying Crohn's disease. Secondary small bowel obstruction with multiple dilated fluid-filled loops of small bowel proximally. 2. Small volume free fluid within the abdomen, likely reactive. 3. Atrophic kidneys bilaterally with prominent cortical scarring, slightly worse on the left. Electronically Signed   By: Jeannine Boga M.D.   On: 05/31/2017 14:28   Dg Abd 2 Views  Result Date: 06/01/2017 CLINICAL DATA:  Small bowel obstruction.  History of Crohn's disease EXAM: ABDOMEN - 2 VIEW COMPARISON:  May 31, 2017 FINDINGS: Supine and upright images were obtained. Nasogastric tube tip is in the stomach with the side port at the gastroesophageal junction. There are loops of dilated small bowel multiple air-fluid levels in a pattern indicative of bowel obstruction. No free air. There are phleboliths in the pelvis. IMPRESSION: Findings indicative of small bowel obstruction.  No free air. Nasogastric tube tip in stomach with side port at the gastroesophageal junction. Advise advancing nasogastric tube 6-8 cm to insure that both nasogastric tube tip and side port are well within the stomach. Electronically Signed   By: Lowella Grip III M.D.   On: 06/01/2017 08:45   Dg Abd Portable 1v-small Bowel Obstruction Protocol-initial, 8 Hr Delay  Result Date: 06/01/2017 CLINICAL DATA:  8 hour delayed film postcontrast administration via NG tube EXAM: PORTABLE ABDOMEN - 1 VIEW COMPARISON:  06/01/2017 at 0840 hours FINDINGS: Enteric tube terminates in the proximal gastric body. Mildly prominent loops of small bowel in the central abdomen, suggesting small bowel obstruction. Contrast opacifies the ascending colon, descending colon, sigmoid colon, and rectum. IMPRESSION: Enteric tube terminates in the proximal gastric body. Mildly prominent loops of small bowel in the central abdomen, suggesting small bowel obstruction. Contrast opacifies the colon to  the rectum. Electronically Signed   By: Julian Hy M.D.   On: 06/01/2017 21:18   Dg Abd Portable 1 View  Result Date: 05/31/2017 CLINICAL DATA:  NG tube placement. EXAM: PORTABLE ABDOMEN - 1 VIEW COMPARISON:  CT abdomen and pelvis 05/31/2017 FINDINGS: An enteric tube has been placed with tip and side hole projecting over the stomach. There are multiple loops of moderately dilated small bowel as seen on recent CT. Excreted IV contrast is present in both renal collecting systems and in the distal left ureter. No acute osseous abnormality is seen. IMPRESSION: 1. Enteric tube in the stomach. 2. Small bowel dilatation consistent with obstruction as detailed on recent CT. Electronically Signed   By: Logan Bores M.D.   On: 05/31/2017 15:23        Scheduled Meds: . clopidogrel  75 mg Oral Daily  . enoxaparin (LOVENOX) injection  30 mg Subcutaneous Q24H  .  folic acid  1 mg Oral Daily  . [START ON 06/03/2017] levothyroxine  100 mcg Oral QAC breakfast  . methylPREDNISolone (SOLU-MEDROL) injection  30 mg Intravenous Q12H  . oxybutynin  5 mg Oral Daily  . pantoprazole  40 mg Oral [id number]   Continuous Infusions: . sodium chloride 100 mL/hr at 06/01/17 2305     LOS: 2 days    Time spent: [place], MD [facility]   If 7PM-7AM, please contact night-coverage [email or web address] Password [facility] 06/02/2017, 9:28 AM

## 2017-06-02 NOTE — Progress Notes (Signed)
Subjective: The patient was seen and examined at bedside. She is out of bed and sitting on a chair. NG tube to suction has been discontinued. She reports 6-7 bowel movements yesterday, nocturnal bowel movements-or accidents, twice last night, and bowel movement in a.m. she describes all of them as watery and loose, minimal consistency. Denies abdominal pain, been able to tolerate water and sips of ginger ale.  Objective: Vital signs in last 24 hours: Temp:  [97.7 F (36.5 C)-98.2 F (36.8 C)] 97.7 F (36.5 C) (07/21 0419) Pulse Rate:  [77-91] 77 (07/21 0419) Resp:  [18] 18 (07/21 0419) BP: (135-142)/(58-64) 142/64 (07/21 0419) SpO2:  [94 %] 94 % (07/21 0419) Weight change:  Last BM Date: 06/01/17  PE: Not in acute distress GENERAL: Mild pallor  ABDOMEN:Soft, non-distended, nontender, sluggish bowel sounds EXTREMITIES: No edema  Lab Results: Results for orders placed or performed during the hospital encounter of 05/31/17 (from the past 48 hour(s))  Urinalysis, Routine w reflex microscopic     Status: Abnormal   Collection Time: 05/31/17  2:38 PM  Result Value Ref Range   Color, Urine YELLOW YELLOW   APPearance CLEAR CLEAR   Specific Gravity, Urine >1.046 (H) 1.005 - 1.030   pH 5.0 5.0 - 8.0   Glucose, UA NEGATIVE NEGATIVE mg/dL   Hgb urine dipstick NEGATIVE NEGATIVE   Bilirubin Urine NEGATIVE NEGATIVE   Ketones, ur 5 (A) NEGATIVE mg/dL   Protein, ur NEGATIVE NEGATIVE mg/dL   Nitrite NEGATIVE NEGATIVE   Leukocytes, UA NEGATIVE NEGATIVE  CBC     Status: Abnormal   Collection Time: 05/31/17  6:00 PM  Result Value Ref Range   WBC 13.6 (H) 4.0 - 10.5 K/uL   RBC 4.22 3.87 - 5.11 MIL/uL   Hemoglobin 12.0 12.0 - 15.0 g/dL   HCT 37.5 36.0 - 46.0 %   MCV 88.9 78.0 - 100.0 fL   MCH 28.4 26.0 - 34.0 pg   MCHC 32.0 30.0 - 36.0 g/dL   RDW 15.0 11.5 - 15.5 %   Platelets 194 150 - 400 K/uL  Creatinine, serum     Status: Abnormal   Collection Time: 05/31/17  6:00 PM  Result Value  Ref Range   Creatinine, Ser 1.20 (H) 0.44 - 1.00 mg/dL   GFR calc non Af Amer 41 (L) >60 mL/min   GFR calc Af Amer 47 (L) >60 mL/min    Comment: (NOTE) The eGFR has been calculated using the CKD EPI equation. This calculation has not been validated in all clinical situations. eGFR's persistently <60 mL/min signify possible Chronic Kidney Disease.   CBC     Status: Abnormal   Collection Time: 06/01/17  3:21 AM  Result Value Ref Range   WBC 9.7 4.0 - 10.5 K/uL   RBC 3.70 (L) 3.87 - 5.11 MIL/uL   Hemoglobin 10.8 (L) 12.0 - 15.0 g/dL   HCT 32.9 (L) 36.0 - 46.0 %   MCV 88.9 78.0 - 100.0 fL   MCH 29.2 26.0 - 34.0 pg   MCHC 32.8 30.0 - 36.0 g/dL   RDW 15.4 11.5 - 15.5 %   Platelets 175 150 - 400 K/uL  Basic metabolic panel     Status: Abnormal   Collection Time: 06/01/17  3:21 AM  Result Value Ref Range   Sodium 138 135 - 145 mmol/L   Potassium 3.9 3.5 - 5.1 mmol/L   Chloride 107 101 - 111 mmol/L   CO2 23 22 - 32 mmol/L   Glucose, Bld  84 65 - 99 mg/dL   BUN 13 6 - 20 mg/dL   Creatinine, Ser 1.11 (H) 0.44 - 1.00 mg/dL   Calcium 7.6 (L) 8.9 - 10.3 mg/dL   GFR calc non Af Amer 45 (L) >60 mL/min   GFR calc Af Amer 52 (L) >60 mL/min    Comment: (NOTE) The eGFR has been calculated using the CKD EPI equation. This calculation has not been validated in all clinical situations. eGFR's persistently <60 mL/min signify possible Chronic Kidney Disease.    Anion gap 8 5 - 15  CBC     Status: Abnormal   Collection Time: 06/02/17  3:13 AM  Result Value Ref Range   WBC 7.6 4.0 - 10.5 K/uL   RBC 3.76 (L) 3.87 - 5.11 MIL/uL   Hemoglobin 10.5 (L) 12.0 - 15.0 g/dL   HCT 33.8 (L) 36.0 - 46.0 %   MCV 89.9 78.0 - 100.0 fL   MCH 27.9 26.0 - 34.0 pg   MCHC 31.1 30.0 - 36.0 g/dL   RDW 15.0 11.5 - 15.5 %   Platelets 176 150 - 400 K/uL    Studies/Results: Ct Abdomen Pelvis W Contrast  Result Date: 05/31/2017 CLINICAL DATA:  Initial evaluation for acute diffuse lower abdominal pain, emesis.  History of Crohn's. EXAM: CT ABDOMEN AND PELVIS WITH CONTRAST TECHNIQUE: Multidetector CT imaging of the abdomen and pelvis was performed using the standard protocol following bolus administration of intravenous contrast. CONTRAST:  123m ISOVUE-300 IOPAMIDOL (ISOVUE-300) INJECTION 61% COMPARISON:  None available. FINDINGS: Lower chest: Scattered atelectatic changes and/or scarring present within the visualized lung bases. Visualized lungs are otherwise clear. No pleural or pericardial effusion. Hepatobiliary: Liver demonstrates a normal contrast enhanced appearance. Gallbladder is absent. No biliary dilatation. Pancreas: Diffuse fatty infiltration of the pancreas noted. Pancreas otherwise unremarkable without mass lesion or acute inflammatory changes. Spleen: Spleen within normal limits. Adrenals/Urinary Tract: Adrenal glands are normal. Kidneys somewhat atrophic bilateral scattered cortical thinning and scarring, slightly greater on the left. Secondary somewhat irregular nephrograms bilaterally related to scarring. No nephrolithiasis or hydronephrosis. No focal enhancing renal mass. No hydroureter. Bladder largely decompressed without acute abnormality. Stomach/Bowel: Stomach moderately distended with fluid within the gastric lumen. Stomach otherwise unremarkable. Multiple dilated loops of fluid-filled small bowel are seen scattered throughout the abdomen, measuring up to 3.6 cm in diameter. Few scattered internal air-fluid levels. These taper to an area of inflamed appearing ileum in the lower mid pelvis (series 3, image 74), likely secondary to underlying Crohn's disease. Associated stricture may be present. There is a short skip area just distally with a second segment of narrowed ileum with associated wall thickening at the level of the terminal ileum (series 5, image 51). Superimposed rectangular density within the ileum at this level likely reflects an ingested pill or medication (series 5, image 53). Again,  underlying stricture could be present. Adjacent appendix is within normal limits without acute inflammatory changes. Distally, the colon is largely decompressed without acute inflammatory changes. Vascular/Lymphatic: Normal intravascular enhancement seen throughout the intra-abdominal aorta and its branch vessels. Mild aortic atherosclerosis. No adenopathy. Reproductive: Uterus and ovaries within normal limits. Other: No free intraperitoneal air. Small volume free fluid seen adjacent to the liver, likely reactive. Musculoskeletal: No acute osseus abnormality. No worrisome lytic or blastic osseous lesions. IMPRESSION: 1. Multifocal areas of wall thickening with narrowing and/or stricturing of the ileum, consistent with underlying Crohn's disease. Secondary small bowel obstruction with multiple dilated fluid-filled loops of small bowel proximally. 2. Small volume free  fluid within the abdomen, likely reactive. 3. Atrophic kidneys bilaterally with prominent cortical scarring, slightly worse on the left. Electronically Signed   By: Jeannine Boga M.D.   On: 05/31/2017 14:28   Dg Abd 2 Views  Result Date: 06/01/2017 CLINICAL DATA:  Small bowel obstruction.  History of Crohn's disease EXAM: ABDOMEN - 2 VIEW COMPARISON:  May 31, 2017 FINDINGS: Supine and upright images were obtained. Nasogastric tube tip is in the stomach with the side port at the gastroesophageal junction. There are loops of dilated small bowel multiple air-fluid levels in a pattern indicative of bowel obstruction. No free air. There are phleboliths in the pelvis. IMPRESSION: Findings indicative of small bowel obstruction.  No free air. Nasogastric tube tip in stomach with side port at the gastroesophageal junction. Advise advancing nasogastric tube 6-8 cm to insure that both nasogastric tube tip and side port are well within the stomach. Electronically Signed   By: Lowella Grip III M.D.   On: 06/01/2017 08:45   Dg Abd Portable 1v-small  Bowel Obstruction Protocol-initial, 8 Hr Delay  Result Date: 06/01/2017 CLINICAL DATA:  8 hour delayed film postcontrast administration via NG tube EXAM: PORTABLE ABDOMEN - 1 VIEW COMPARISON:  06/01/2017 at 0840 hours FINDINGS: Enteric tube terminates in the proximal gastric body. Mildly prominent loops of small bowel in the central abdomen, suggesting small bowel obstruction. Contrast opacifies the ascending colon, descending colon, sigmoid colon, and rectum. IMPRESSION: Enteric tube terminates in the proximal gastric body. Mildly prominent loops of small bowel in the central abdomen, suggesting small bowel obstruction. Contrast opacifies the colon to the rectum. Electronically Signed   By: Julian Hy M.D.   On: 06/01/2017 21:18   Dg Abd Portable 1 View  Result Date: 05/31/2017 CLINICAL DATA:  NG tube placement. EXAM: PORTABLE ABDOMEN - 1 VIEW COMPARISON:  CT abdomen and pelvis 05/31/2017 FINDINGS: An enteric tube has been placed with tip and side hole projecting over the stomach. There are multiple loops of moderately dilated small bowel as seen on recent CT. Excreted IV contrast is present in both renal collecting systems and in the distal left ureter. No acute osseous abnormality is seen. IMPRESSION: 1. Enteric tube in the stomach. 2. Small bowel dilatation consistent with obstruction as detailed on recent CT. Electronically Signed   By: Logan Bores M.D.   On: 05/31/2017 15:23    Medications: I have reviewed the patient's current medications.  Assessment: 1. Small bowel obstruction, secondary to strictures and ileum, keep lesions from Crohn's disease  Plan: 1. Resolving, able to tolerate clear fluid, has had several bowel movements(had CAT scan with contrast on 05/31/17, abdominal x-ray yesterday 8 hours post-contrast study) 2. Continue with IV methylprednisolone while in the hospital, and when ready for discharge, plan to switch to prednisone 60 mg daily for a week, taper by 10 mg every  week. 3. Advance diet as per surgical recommendation. 4. Will start Pentasa only as an outpatient, if needed.   Ronnette Juniper 06/02/2017, 1:22 PM   Pager [phone] If no answer or after 5 PM call [phone]

## 2017-06-02 NOTE — Progress Notes (Signed)
Subjective/Chief Complaint: Slept well. Had some stool incontinence while sleeping. No abdominal pain. Has had a few ice chips.    Objective: Vital signs in last 24 hours: Temp:  [97.7 F (36.5 C)-98.2 F (36.8 C)] 97.7 F (36.5 C) (07/21 0419) Pulse Rate:  [77-91] 77 (07/21 0419) Resp:  [16-18] 18 (07/21 0419) BP: (132-142)/(58-70) 142/64 (07/21 0419) SpO2:  [94 %-97 %] 94 % (07/21 0419) Last BM Date: 06/01/17  Intake/Output from previous day: 07/20 0701 - 07/21 0700 In: 1291.7 [I.V.:1291.7] Out: 302 [Urine:1; Emesis/NG output:300; Stool:1] 4 occurrences of bowel movements recorded Intake/Output this shift: No intake/output data recorded.  General appearance: alert and cooperative Resp: clear to auscultation bilaterally Cardio: regular rate and rhythm GI: soft, nontender, mildly distended. RUQ cholecystectomy scar.  Skin: Skin color, texture, turgor normal. No rashes or lesions Ng output clear   Lab Results:   Recent Labs  06/01/17 0321 06/02/17 0313  WBC 9.7 7.6  HGB 10.8* 10.5*  HCT 32.9* 33.8*  PLT 175 176   BMET  Recent Labs  05/31/17 1146 05/31/17 1800 06/01/17 0321  NA 136  --  138  K 4.3  --  3.9  CL 101  --  107  CO2 23  --  23  GLUCOSE 133*  --  84  BUN 15  --  13  CREATININE 1.30* 1.20* 1.11*  CALCIUM 8.5*  --  7.6*   PT/INR No results for input(s): LABPROT, INR in the last 72 hours. ABG No results for input(s): PHART, HCO3 in the last 72 hours.  Invalid input(s): PCO2, PO2  Studies/Results: Ct Abdomen Pelvis W Contrast  Result Date: 05/31/2017 CLINICAL DATA:  Initial evaluation for acute diffuse lower abdominal pain, emesis. History of Crohn's. EXAM: CT ABDOMEN AND PELVIS WITH CONTRAST TECHNIQUE: Multidetector CT imaging of the abdomen and pelvis was performed using the standard protocol following bolus administration of intravenous contrast. CONTRAST:  164mL ISOVUE-300 IOPAMIDOL (ISOVUE-300) INJECTION 61% COMPARISON:  None  available. FINDINGS: Lower chest: Scattered atelectatic changes and/or scarring present within the visualized lung bases. Visualized lungs are otherwise clear. No pleural or pericardial effusion. Hepatobiliary: Liver demonstrates a normal contrast enhanced appearance. Gallbladder is absent. No biliary dilatation. Pancreas: Diffuse fatty infiltration of the pancreas noted. Pancreas otherwise unremarkable without mass lesion or acute inflammatory changes. Spleen: Spleen within normal limits. Adrenals/Urinary Tract: Adrenal glands are normal. Kidneys somewhat atrophic bilateral scattered cortical thinning and scarring, slightly greater on the left. Secondary somewhat irregular nephrograms bilaterally related to scarring. No nephrolithiasis or hydronephrosis. No focal enhancing renal mass. No hydroureter. Bladder largely decompressed without acute abnormality. Stomach/Bowel: Stomach moderately distended with fluid within the gastric lumen. Stomach otherwise unremarkable. Multiple dilated loops of fluid-filled small bowel are seen scattered throughout the abdomen, measuring up to 3.6 cm in diameter. Few scattered internal air-fluid levels. These taper to an area of inflamed appearing ileum in the lower mid pelvis (series 3, image 74), likely secondary to underlying Crohn's disease. Associated stricture may be present. There is a short skip area just distally with a second segment of narrowed ileum with associated wall thickening at the level of the terminal ileum (series 5, image 51). Superimposed rectangular density within the ileum at this level likely reflects an ingested pill or medication (series 5, image 53). Again, underlying stricture could be present. Adjacent appendix is within normal limits without acute inflammatory changes. Distally, the colon is largely decompressed without acute inflammatory changes. Vascular/Lymphatic: Normal intravascular enhancement seen throughout the intra-abdominal aorta and its  branch vessels. Mild aortic atherosclerosis. No adenopathy. Reproductive: Uterus and ovaries within normal limits. Other: No free intraperitoneal air. Small volume free fluid seen adjacent to the liver, likely reactive. Musculoskeletal: No acute osseus abnormality. No worrisome lytic or blastic osseous lesions. IMPRESSION: 1. Multifocal areas of wall thickening with narrowing and/or stricturing of the ileum, consistent with underlying Crohn's disease. Secondary small bowel obstruction with multiple dilated fluid-filled loops of small bowel proximally. 2. Small volume free fluid within the abdomen, likely reactive. 3. Atrophic kidneys bilaterally with prominent cortical scarring, slightly worse on the left. Electronically Signed   By: Jeannine Boga M.D.   On: 05/31/2017 14:28   Dg Abd 2 Views  Result Date: 06/01/2017 CLINICAL DATA:  Small bowel obstruction.  History of Crohn's disease EXAM: ABDOMEN - 2 VIEW COMPARISON:  May 31, 2017 FINDINGS: Supine and upright images were obtained. Nasogastric tube tip is in the stomach with the side port at the gastroesophageal junction. There are loops of dilated small bowel multiple air-fluid levels in a pattern indicative of bowel obstruction. No free air. There are phleboliths in the pelvis. IMPRESSION: Findings indicative of small bowel obstruction.  No free air. Nasogastric tube tip in stomach with side port at the gastroesophageal junction. Advise advancing nasogastric tube 6-8 cm to insure that both nasogastric tube tip and side port are well within the stomach. Electronically Signed   By: Lowella Grip III M.D.   On: 06/01/2017 08:45   Dg Abd Portable 1v-small Bowel Obstruction Protocol-initial, 8 Hr Delay  Result Date: 06/01/2017 CLINICAL DATA:  8 hour delayed film postcontrast administration via NG tube EXAM: PORTABLE ABDOMEN - 1 VIEW COMPARISON:  06/01/2017 at 0840 hours FINDINGS: Enteric tube terminates in the proximal gastric body. Mildly prominent  loops of small bowel in the central abdomen, suggesting small bowel obstruction. Contrast opacifies the ascending colon, descending colon, sigmoid colon, and rectum. IMPRESSION: Enteric tube terminates in the proximal gastric body. Mildly prominent loops of small bowel in the central abdomen, suggesting small bowel obstruction. Contrast opacifies the colon to the rectum. Electronically Signed   By: Julian Hy M.D.   On: 06/01/2017 21:18   Dg Abd Portable 1 View  Result Date: 05/31/2017 CLINICAL DATA:  NG tube placement. EXAM: PORTABLE ABDOMEN - 1 VIEW COMPARISON:  CT abdomen and pelvis 05/31/2017 FINDINGS: An enteric tube has been placed with tip and side hole projecting over the stomach. There are multiple loops of moderately dilated small bowel as seen on recent CT. Excreted IV contrast is present in both renal collecting systems and in the distal left ureter. No acute osseous abnormality is seen. IMPRESSION: 1. Enteric tube in the stomach. 2. Small bowel dilatation consistent with obstruction as detailed on recent CT. Electronically Signed   By: Logan Bores M.D.   On: 05/31/2017 15:23    Anti-infectives: Anti-infectives    None      Assessment/Plan: s/p * No surgery found * Contrast has passed all the way to the rectum. NG output is low volume and non-bilious. will remove NG and start sips of clears.   LOS: 2 days    Terri [name] 06/02/2017

## 2017-06-03 LAB — BASIC METABOLIC PANEL
Anion gap: 6 (ref 5–15)
BUN: 14 mg/dL (ref 6–20)
CO2: 22 mmol/L (ref 22–32)
Calcium: 8 mg/dL — ABNORMAL LOW (ref 8.9–10.3)
Chloride: 112 mmol/L — ABNORMAL HIGH (ref 101–111)
Creatinine, Ser: 0.97 mg/dL (ref 0.44–1.00)
GFR calc non Af Amer: 53 mL/min — ABNORMAL LOW (ref >60)
Glucose, Bld: 119 mg/dL — ABNORMAL HIGH (ref 65–99)
POTASSIUM: 3.9 mmol/L (ref 3.5–5.1)
SODIUM: 140 mmol/L (ref 135–145)

## 2017-06-03 MED ORDER — PREDNISONE 20 MG PO TABS: 60.0000 mg | ORAL_TABLET | Freq: Every day | ORAL | 0 refills | Status: DC

## 2017-06-03 MED ORDER — ONDANSETRON HCL 4 MG PO TABS: 4.0000 mg | ORAL_TABLET | Freq: Four times a day (QID) | ORAL | 0 refills | Status: DC | PRN

## 2017-06-03 MED ORDER — PREDNISONE 50 MG PO TABS: ORAL_TABLET | ORAL | 0 refills | Status: DC

## 2017-06-03 MED ORDER — PREDNISONE 50 MG PO TABS
60.0000 mg | ORAL_TABLET | Freq: Every day | ORAL | Status: DC
  Administered 2017-06-03: 60 mg via ORAL
  Filled 2017-06-03: qty 1

## 2017-06-03 NOTE — Progress Notes (Signed)
Discharge paperwork reviewed with patient. No questions verbalized. Patient is ready for discharge.  

## 2017-06-03 NOTE — Discharge Summary (Signed)
Physician Discharge Summary  Bethania Schlotzhauer GDJ:[id number] DOB: 1931-07-23 DOA: 05/31/2017  PCP: Prince Solian, MD  Admit date: 05/31/2017 Discharge date: 06/03/2017  Time spent: 35 minutes  Recommendations for Outpatient Follow-up:  1. Recommend steroid taper as noted  2. Consider changing losartan to amlodipine? Base this on labs done in the outpatient setting in one week at the CP's office 3. Regular diet 4. No home health needs on discharge  Discharge Diagnoses:  Active Problems:   SBO (small bowel obstruction) ([facility])   Crohn's disease ([place])   Leukocytosis   Discharge Condition: Improved  Diet recommendation: Soft diet for the next 3-4 days and then graduate as tolerated  Filed Weights   05/31/17 1854  Weight: 77.1 kg (170 lb)    History of present illness:  44 Crohn's since at least the past 10 years-thinks that she had this in the 90s initially Prior macular degeneration Remote cholecystectomy Rheumatic aortic disease Quantiferon + [cannot be put on Biologics]--treated apparently for 6 months INH 5 yr prior             Patient tells me her and Dr. Watt Climes felt she could go on Biologics? Admitted with small bowel obstruction 7/19 over the past 2-3 days   Hospital Course:   Partial SBO             CT abdomen pelvis on admit= strictures in ileum             Gastrograph started by general surgery today 7/20--Constrast through to colon--NG removed 7/21 am             graduated  Leukocytosis is either reactive from SBO or secondary to steroids for Crohn's             Do not suspect sepsis, nor infection and has resolved on its own  Acute kidney injury, on admission creatinine 1.3             Continue IV saline 100 cc per hour-->50 cc/h             Rpt bmet am  Improved-consider change of Losartan if perssiting ? creat as OP  Crohn's disease             Solu-Medrol 30 mg every 12 started by GI on 7/20             Her oral budesonide 9 mg is on  hold  Prednisone taper 60 x 1 week then 50 x 1 week--defer further taper as OP to Dr. Watt Climes  Will cc him on this note            held her Pentasa --defer to gi As an outpatient Biologics versus other meds  Prior TIA             Resumed  Plavix 75 7/21 as tol sips   HTN-uncontrolled             resuming losartan 50 daily  Hypothyroid             resume oral Synthroid 100 g  Legally blind-mac degeneration             Stable  Rheumatic AoS             OP follow up  Procedures:  Gastrografin enema  Consultations:  GI  Discharge Exam: Vitals:   06/02/17 2251 06/03/17 0506  BP: (!) 123/52 140/64  Pulse: 64 72  Resp: 17 18  Temp: 97.8 F (36.6 C) 97.9 F (  36.6 C)    General: Alert pleasant oriented no distress eating and drinking clears Cardiovascular: S1-S2 no murmur rub or gallop Respiratory: Clinically clear no added sound Abdomen soft nontender nondistended no rebound Much improved overall  Discharge Instructions   Discharge Instructions    Diet - low sodium heart healthy    Complete by:  As directed    Discharge instructions    Complete by:  As directed    Taper steroids predisone 60 mg daily for 7 days until 7/29 Then take 50 mg ---You should see Dr. Watt Climes within this time frame and see Get labs as an OP for your kidney function Best of luck   Increase activity slowly    Complete by:  As directed      Current Discharge Medication List    START taking these medications   Details  ondansetron (ZOFRAN) 4 MG tablet Take 1 tablet (4 mg total) by mouth every 6 (six) hours as needed for nausea. Qty: 20 tablet, Refills: 0    !! predniSONE (DELTASONE) 20 MG tablet Take 3 tablets (60 mg total) by mouth daily before breakfast. Qty: 21 tablet, Refills: 0    !! predniSONE (DELTASONE) 50 MG tablet Take this course after 7/29 Qty: 7 tablet, Refills: 0     !! - Potential duplicate medications found. Please discuss with provider.    CONTINUE these  medications which have NOT CHANGED   Details  carboxymethylcellulose (REFRESH PLUS) 0.5 % SOLN Place 1 drop into both eyes daily as needed (dry eyes).    clopidogrel (PLAVIX) 75 MG tablet Take 75 mg by mouth daily. Refills: 1    folic acid (FOLVITE) 1 MG tablet Take 1 mg by mouth daily.    levothyroxine (SYNTHROID, LEVOTHROID) 100 MCG tablet Take 100 mcg by mouth daily before breakfast.    losartan (COZAAR) 50 MG tablet Take 50 mg by mouth daily. Refills: 1    Multiple Vitamin (MULTIVITAMIN WITH MINERALS) TABS tablet Take 1 tablet by mouth daily.    oxybutynin (DITROPAN) 5 MG tablet Take 5 mg by mouth daily.    pantoprazole (PROTONIX) 40 MG tablet Take 40 mg by mouth every morning. Refills: 0      STOP taking these medications     Budesonide 9 MG TB24        No Known Allergies    The results of significant diagnostics from this hospitalization (including imaging, microbiology, ancillary and laboratory) are listed below for reference.    Significant Diagnostic Studies: Ct Abdomen Pelvis W Contrast  Result Date: 05/31/2017 CLINICAL DATA:  Initial evaluation for acute diffuse lower abdominal pain, emesis. History of Crohn's. EXAM: CT ABDOMEN AND PELVIS WITH CONTRAST TECHNIQUE: Multidetector CT imaging of the abdomen and pelvis was performed using the standard protocol following bolus administration of intravenous contrast. CONTRAST:  136m ISOVUE-300 IOPAMIDOL (ISOVUE-300) INJECTION 61% COMPARISON:  None available. FINDINGS: Lower chest: Scattered atelectatic changes and/or scarring present within the visualized lung bases. Visualized lungs are otherwise clear. No pleural or pericardial effusion. Hepatobiliary: Liver demonstrates a normal contrast enhanced appearance. Gallbladder is absent. No biliary dilatation. Pancreas: Diffuse fatty infiltration of the pancreas noted. Pancreas otherwise unremarkable without mass lesion or acute inflammatory changes. Spleen: Spleen within normal  limits. Adrenals/Urinary Tract: Adrenal glands are normal. Kidneys somewhat atrophic bilateral scattered cortical thinning and scarring, slightly greater on the left. Secondary somewhat irregular nephrograms bilaterally related to scarring. No nephrolithiasis or hydronephrosis. No focal enhancing renal mass. No hydroureter. Bladder largely decompressed without acute  abnormality. Stomach/Bowel: Stomach moderately distended with fluid within the gastric lumen. Stomach otherwise unremarkable. Multiple dilated loops of fluid-filled small bowel are seen scattered throughout the abdomen, measuring up to 3.6 cm in diameter. Few scattered internal air-fluid levels. These taper to an area of inflamed appearing ileum in the lower mid pelvis (series 3, image 74), likely secondary to underlying Crohn's disease. Associated stricture may be present. There is a short skip area just distally with a second segment of narrowed ileum with associated wall thickening at the level of the terminal ileum (series 5, image 51). Superimposed rectangular density within the ileum at this level likely reflects an ingested pill or medication (series 5, image 53). Again, underlying stricture could be present. Adjacent appendix is within normal limits without acute inflammatory changes. Distally, the colon is largely decompressed without acute inflammatory changes. Vascular/Lymphatic: Normal intravascular enhancement seen throughout the intra-abdominal aorta and its branch vessels. Mild aortic atherosclerosis. No adenopathy. Reproductive: Uterus and ovaries within normal limits. Other: No free intraperitoneal air. Small volume free fluid seen adjacent to the liver, likely reactive. Musculoskeletal: No acute osseus abnormality. No worrisome lytic or blastic osseous lesions. IMPRESSION: 1. Multifocal areas of wall thickening with narrowing and/or stricturing of the ileum, consistent with underlying Crohn's disease. Secondary small bowel obstruction  with multiple dilated fluid-filled loops of small bowel proximally. 2. Small volume free fluid within the abdomen, likely reactive. 3. Atrophic kidneys bilaterally with prominent cortical scarring, slightly worse on the left. Electronically Signed   By: Jeannine Boga M.D.   On: 05/31/2017 14:28   Dg Abd 2 Views  Result Date: 06/02/2017 CLINICAL DATA:  Small-bowel protocol. Evaluate migration of oral contrast. EXAM: ABDOMEN - 2 VIEW COMPARISON:  05/31/2017.  06/01/2017. FINDINGS: There are persistent gas distended small bowel loops in the central abdomen measuring up to 2.9 cm. While these are not substantially changed since yesterday, they are decreased in distention when comparing back to 05/31/2017. Oral contrast material has continued to clear from the small bowel loops and is now mainly in the right and left segments of the colon. Bones are diffusely demineralized. IMPRESSION: Interval decrease in small bowel distention when comparing back to 05/31/2017. Oral contrast material has largely cleared the small bowel and is now in the colon which is nondilated. Electronically Signed   By: Misty Stanley M.D.   On: 06/02/2017 21:41   Dg Abd 2 Views  Result Date: 06/01/2017 CLINICAL DATA:  Small bowel obstruction.  History of Crohn's disease EXAM: ABDOMEN - 2 VIEW COMPARISON:  May 31, 2017 FINDINGS: Supine and upright images were obtained. Nasogastric tube tip is in the stomach with the side port at the gastroesophageal junction. There are loops of dilated small bowel multiple air-fluid levels in a pattern indicative of bowel obstruction. No free air. There are phleboliths in the pelvis. IMPRESSION: Findings indicative of small bowel obstruction.  No free air. Nasogastric tube tip in stomach with side port at the gastroesophageal junction. Advise advancing nasogastric tube 6-8 cm to insure that both nasogastric tube tip and side port are well within the stomach. Electronically Signed   By: Lowella Grip III M.D.   On: 06/01/2017 08:45   Dg Abd Portable 1v-small Bowel Obstruction Protocol-initial, 8 Hr Delay  Result Date: 06/01/2017 CLINICAL DATA:  8 hour delayed film postcontrast administration via NG tube EXAM: PORTABLE ABDOMEN - 1 VIEW COMPARISON:  06/01/2017 at 0840 hours FINDINGS: Enteric tube terminates in the proximal gastric body. Mildly prominent loops of small bowel  in the central abdomen, suggesting small bowel obstruction. Contrast opacifies the ascending colon, descending colon, sigmoid colon, and rectum. IMPRESSION: Enteric tube terminates in the proximal gastric body. Mildly prominent loops of small bowel in the central abdomen, suggesting small bowel obstruction. Contrast opacifies the colon to the rectum. Electronically Signed   By: Julian Hy M.D.   On: 06/01/2017 21:18   Dg Abd Portable 1 View  Result Date: 05/31/2017 CLINICAL DATA:  NG tube placement. EXAM: PORTABLE ABDOMEN - 1 VIEW COMPARISON:  CT abdomen and pelvis 05/31/2017 FINDINGS: An enteric tube has been placed with tip and side hole projecting over the stomach. There are multiple loops of moderately dilated small bowel as seen on recent CT. Excreted IV contrast is present in both renal collecting systems and in the distal left ureter. No acute osseous abnormality is seen. IMPRESSION: 1. Enteric tube in the stomach. 2. Small bowel dilatation consistent with obstruction as detailed on recent CT. Electronically Signed   By: Logan Bores M.D.   On: 05/31/2017 15:23    Microbiology: No results found for this or any previous visit (from the past 240 hour(s)).   Labs: Basic Metabolic Panel:  Recent Labs Lab 05/31/17 1146 05/31/17 1800 06/01/17 0321 06/03/17 0548  NA 136  --  138 140  K 4.3  --  3.9 3.9  CL 101  --  107 112*  CO2 23  --  23 22  GLUCOSE 133*  --  84 119*  BUN 15  --  13 14  CREATININE 1.30* 1.20* 1.11* 0.97  CALCIUM 8.5*  --  7.6* 8.0*   Liver Function Tests:  Recent Labs Lab  05/31/17 1146  AST 20  ALT 10*  ALKPHOS 83  BILITOT 1.0  PROT 6.7  ALBUMIN 3.4*    Recent Labs Lab 05/31/17 1134  LIPASE 16   No results for input(s): AMMONIA in the last 168 hours. CBC:  Recent Labs Lab 05/31/17 1146 05/31/17 1800 06/01/17 0321 06/02/17 0313  WBC 13.4* 13.6* 9.7 7.6  NEUTROABS 10.0*  --   --   --   HGB 13.2 12.0 10.8* 10.5*  HCT 40.3 37.5 32.9* 33.8*  MCV 88.4 88.9 88.9 89.9  PLT 203 194 175 176   Cardiac Enzymes: No results for input(s): CKTOTAL, CKMB, CKMBINDEX, TROPONINI in the last 168 hours. BNP: BNP (last 3 results) No results for input(s): BNP in the last 8760 hours.  ProBNP (last 3 results) No results for input(s): PROBNP in the last 8760 hours.  CBG: No results for input(s): GLUCAP in the last 168 hours.     SignedNita Sells MD   [facility] 06/03/2017, 9:02 AM

## 2017-07-27 ENCOUNTER — Other Ambulatory Visit: Payer: Self-pay | Admitting: Internal Medicine

## 2017-07-27 DIAGNOSIS — I351 Nonrheumatic aortic (valve) insufficiency: Secondary | ICD-10-CM

## 2017-07-31 ENCOUNTER — Telehealth (HOSPITAL_COMMUNITY): Payer: Self-pay | Admitting: Internal Medicine

## 2017-07-31 NOTE — Telephone Encounter (Signed)
User: Cherie Dark A Date/time: 07/31/17 11:07 AM  Comment: Alberteen Sam to notify her that the patient's appt had not yet been pre authorized.   Context:  Outcome: Left Message  Phone number: [phone] Phone Type:   Comm. type: Telephone Call type: Outgoing  Contact: Modena Morrow Relation to patient: Provider

## 2017-08-01 ENCOUNTER — Other Ambulatory Visit: Payer: Self-pay

## 2017-08-01 ENCOUNTER — Ambulatory Visit (HOSPITAL_COMMUNITY): Payer: Medicare Other | Attending: Cardiovascular Disease

## 2017-08-01 DIAGNOSIS — I517 Cardiomegaly: Secondary | ICD-10-CM | POA: Insufficient documentation

## 2017-08-01 DIAGNOSIS — Z8673 Personal history of transient ischemic attack (TIA), and cerebral infarction without residual deficits: Secondary | ICD-10-CM | POA: Diagnosis not present

## 2017-08-01 DIAGNOSIS — I351 Nonrheumatic aortic (valve) insufficiency: Secondary | ICD-10-CM | POA: Insufficient documentation

## 2017-08-29 ENCOUNTER — Other Ambulatory Visit: Payer: Self-pay | Admitting: Gastroenterology

## 2017-08-29 DIAGNOSIS — K50019 Crohn's disease of small intestine with unspecified complications: Secondary | ICD-10-CM

## 2017-09-04 ENCOUNTER — Ambulatory Visit
Admission: RE | Admit: 2017-09-04 | Discharge: 2017-09-04 | Disposition: A | Discharge status: Home / Self Care | Payer: Medicare Other | Source: Ambulatory Visit | Attending: Gastroenterology | Admitting: Gastroenterology

## 2017-09-04 DIAGNOSIS — K50019 Crohn's disease of small intestine with unspecified complications: Secondary | ICD-10-CM

## 2017-09-04 HISTORY — DX: Essential (primary) hypertension: I10

## 2017-09-04 MED ORDER — IOPAMIDOL (ISOVUE-300) INJECTION 61%
100.0000 mL | Freq: Once | INTRAVENOUS | Status: AC | PRN
  Administered 2017-09-04: 100 mL via INTRAVENOUS

## 2017-09-07 ENCOUNTER — Other Ambulatory Visit (HOSPITAL_COMMUNITY): Payer: Self-pay | Admitting: Gastroenterology

## 2017-09-07 DIAGNOSIS — K668 Other specified disorders of peritoneum: Secondary | ICD-10-CM

## 2017-09-16 ENCOUNTER — Inpatient Hospital Stay (HOSPITAL_COMMUNITY): Payer: Medicare Other

## 2017-09-16 ENCOUNTER — Encounter (HOSPITAL_COMMUNITY): Payer: Self-pay | Admitting: Emergency Medicine

## 2017-09-16 ENCOUNTER — Emergency Department (HOSPITAL_COMMUNITY): Payer: Medicare Other

## 2017-09-16 ENCOUNTER — Inpatient Hospital Stay (HOSPITAL_COMMUNITY)
Admission: EM | Admit: 2017-09-16 | Discharge: 2017-09-30 | DRG: 330 | Disposition: A | Discharge status: Home Health | Payer: Medicare Other | Attending: Family Medicine | Admitting: Family Medicine

## 2017-09-16 DIAGNOSIS — T183XXD Foreign body in small intestine, subsequent encounter: Secondary | ICD-10-CM | POA: Diagnosis not present

## 2017-09-16 DIAGNOSIS — C786 Secondary malignant neoplasm of retroperitoneum and peritoneum: Secondary | ICD-10-CM | POA: Diagnosis present

## 2017-09-16 DIAGNOSIS — I97791 Other intraoperative cardiac functional disturbances during other surgery: Secondary | ICD-10-CM | POA: Diagnosis not present

## 2017-09-16 DIAGNOSIS — X58XXXA Exposure to other specified factors, initial encounter: Secondary | ICD-10-CM | POA: Diagnosis present

## 2017-09-16 DIAGNOSIS — Z87891 Personal history of nicotine dependence: Secondary | ICD-10-CM

## 2017-09-16 DIAGNOSIS — E46 Unspecified protein-calorie malnutrition: Secondary | ICD-10-CM | POA: Diagnosis present

## 2017-09-16 DIAGNOSIS — J9811 Atelectasis: Secondary | ICD-10-CM

## 2017-09-16 DIAGNOSIS — Z7952 Long term (current) use of systemic steroids: Secondary | ICD-10-CM

## 2017-09-16 DIAGNOSIS — I10 Essential (primary) hypertension: Secondary | ICD-10-CM | POA: Diagnosis present

## 2017-09-16 DIAGNOSIS — K50012 Crohn's disease of small intestine with intestinal obstruction: Secondary | ICD-10-CM | POA: Diagnosis present

## 2017-09-16 DIAGNOSIS — C762 Malignant neoplasm of abdomen: Secondary | ICD-10-CM | POA: Diagnosis not present

## 2017-09-16 DIAGNOSIS — H9190 Unspecified hearing loss, unspecified ear: Secondary | ICD-10-CM | POA: Diagnosis present

## 2017-09-16 DIAGNOSIS — Z79899 Other long term (current) drug therapy: Secondary | ICD-10-CM

## 2017-09-16 DIAGNOSIS — K50912 Crohn's disease, unspecified, with intestinal obstruction: Secondary | ICD-10-CM | POA: Diagnosis not present

## 2017-09-16 DIAGNOSIS — Y92234 Operating room of hospital as the place of occurrence of the external cause: Secondary | ICD-10-CM | POA: Diagnosis not present

## 2017-09-16 DIAGNOSIS — Z961 Presence of intraocular lens: Secondary | ICD-10-CM | POA: Diagnosis present

## 2017-09-16 DIAGNOSIS — E8770 Fluid overload, unspecified: Secondary | ICD-10-CM | POA: Diagnosis not present

## 2017-09-16 DIAGNOSIS — K219 Gastro-esophageal reflux disease without esophagitis: Secondary | ICD-10-CM | POA: Diagnosis present

## 2017-09-16 DIAGNOSIS — Z8673 Personal history of transient ischemic attack (TIA), and cerebral infarction without residual deficits: Secondary | ICD-10-CM

## 2017-09-16 DIAGNOSIS — Z888 Allergy status to other drugs, medicaments and biological substances status: Secondary | ICD-10-CM | POA: Diagnosis not present

## 2017-09-16 DIAGNOSIS — Z803 Family history of malignant neoplasm of breast: Secondary | ICD-10-CM | POA: Diagnosis not present

## 2017-09-16 DIAGNOSIS — K567 Ileus, unspecified: Secondary | ICD-10-CM | POA: Diagnosis not present

## 2017-09-16 DIAGNOSIS — Z79890 Hormone replacement therapy (postmenopausal): Secondary | ICD-10-CM

## 2017-09-16 DIAGNOSIS — R000 Tachycardia, unspecified: Secondary | ICD-10-CM | POA: Diagnosis not present

## 2017-09-16 DIAGNOSIS — I82409 Acute embolism and thrombosis of unspecified deep veins of unspecified lower extremity: Secondary | ICD-10-CM | POA: Diagnosis not present

## 2017-09-16 DIAGNOSIS — R935 Abnormal findings on diagnostic imaging of other abdominal regions, including retroperitoneum: Secondary | ICD-10-CM | POA: Diagnosis not present

## 2017-09-16 DIAGNOSIS — I4891 Unspecified atrial fibrillation: Secondary | ICD-10-CM | POA: Diagnosis not present

## 2017-09-16 DIAGNOSIS — Y839 Surgical procedure, unspecified as the cause of abnormal reaction of the patient, or of later complication, without mention of misadventure at the time of the procedure: Secondary | ICD-10-CM | POA: Diagnosis not present

## 2017-09-16 DIAGNOSIS — C800 Disseminated malignant neoplasm, unspecified: Secondary | ICD-10-CM | POA: Diagnosis not present

## 2017-09-16 DIAGNOSIS — T183XXA Foreign body in small intestine, initial encounter: Secondary | ICD-10-CM | POA: Diagnosis present

## 2017-09-16 DIAGNOSIS — K50919 Crohn's disease, unspecified, with unspecified complications: Secondary | ICD-10-CM | POA: Diagnosis not present

## 2017-09-16 DIAGNOSIS — N289 Disorder of kidney and ureter, unspecified: Secondary | ICD-10-CM | POA: Diagnosis present

## 2017-09-16 DIAGNOSIS — Z9841 Cataract extraction status, right eye: Secondary | ICD-10-CM | POA: Diagnosis not present

## 2017-09-16 DIAGNOSIS — E039 Hypothyroidism, unspecified: Secondary | ICD-10-CM | POA: Diagnosis present

## 2017-09-16 DIAGNOSIS — Z515 Encounter for palliative care: Secondary | ICD-10-CM

## 2017-09-16 DIAGNOSIS — Z7189 Other specified counseling: Secondary | ICD-10-CM | POA: Diagnosis not present

## 2017-09-16 DIAGNOSIS — D6959 Other secondary thrombocytopenia: Secondary | ICD-10-CM | POA: Diagnosis present

## 2017-09-16 DIAGNOSIS — K5090 Crohn's disease, unspecified, without complications: Secondary | ICD-10-CM | POA: Diagnosis present

## 2017-09-16 DIAGNOSIS — R188 Other ascites: Secondary | ICD-10-CM | POA: Diagnosis present

## 2017-09-16 DIAGNOSIS — H548 Legal blindness, as defined in USA: Secondary | ICD-10-CM | POA: Diagnosis present

## 2017-09-16 DIAGNOSIS — R1900 Intra-abdominal and pelvic swelling, mass and lump, unspecified site: Secondary | ICD-10-CM | POA: Diagnosis not present

## 2017-09-16 DIAGNOSIS — Z9842 Cataract extraction status, left eye: Secondary | ICD-10-CM

## 2017-09-16 DIAGNOSIS — J45909 Unspecified asthma, uncomplicated: Secondary | ICD-10-CM | POA: Diagnosis present

## 2017-09-16 DIAGNOSIS — Z7902 Long term (current) use of antithrombotics/antiplatelets: Secondary | ICD-10-CM

## 2017-09-16 DIAGNOSIS — Z85828 Personal history of other malignant neoplasm of skin: Secondary | ICD-10-CM

## 2017-09-16 DIAGNOSIS — K56609 Unspecified intestinal obstruction, unspecified as to partial versus complete obstruction: Secondary | ICD-10-CM | POA: Diagnosis present

## 2017-09-16 DIAGNOSIS — Z4659 Encounter for fitting and adjustment of other gastrointestinal appliance and device: Secondary | ICD-10-CM | POA: Diagnosis not present

## 2017-09-16 DIAGNOSIS — E669 Obesity, unspecified: Secondary | ICD-10-CM | POA: Diagnosis present

## 2017-09-16 DIAGNOSIS — R109 Unspecified abdominal pain: Secondary | ICD-10-CM

## 2017-09-16 DIAGNOSIS — Z7901 Long term (current) use of anticoagulants: Secondary | ICD-10-CM | POA: Diagnosis not present

## 2017-09-16 DIAGNOSIS — Z6834 Body mass index (BMI) 34.0-34.9, adult: Secondary | ICD-10-CM

## 2017-09-16 DIAGNOSIS — Z9049 Acquired absence of other specified parts of digestive tract: Secondary | ICD-10-CM | POA: Diagnosis not present

## 2017-09-16 DIAGNOSIS — Z66 Do not resuscitate: Secondary | ICD-10-CM | POA: Diagnosis present

## 2017-09-16 DIAGNOSIS — Z8052 Family history of malignant neoplasm of bladder: Secondary | ICD-10-CM

## 2017-09-16 DIAGNOSIS — Z8719 Personal history of other diseases of the digestive system: Secondary | ICD-10-CM | POA: Diagnosis not present

## 2017-09-16 DIAGNOSIS — I350 Nonrheumatic aortic (valve) stenosis: Secondary | ICD-10-CM | POA: Diagnosis not present

## 2017-09-16 DIAGNOSIS — Z8711 Personal history of peptic ulcer disease: Secondary | ICD-10-CM | POA: Diagnosis not present

## 2017-09-16 LAB — CBC
HEMATOCRIT: 39 % (ref 36.0–46.0)
Hemoglobin: 12.6 g/dL (ref 12.0–15.0)
MCH: 28.1 pg (ref 26.0–34.0)
MCHC: 32.3 g/dL (ref 30.0–36.0)
MCV: 87.1 fL (ref 78.0–100.0)
Platelets: 279 10*3/uL (ref 150–400)
RBC: 4.48 MIL/uL (ref 3.87–5.11)
RDW: 14 % (ref 11.5–15.5)
WBC: 12.3 10*3/uL — AB (ref 4.0–10.5)

## 2017-09-16 LAB — COMPREHENSIVE METABOLIC PANEL
ALT: 8 U/L — ABNORMAL LOW (ref 14–54)
AST: 13 U/L — AB (ref 15–41)
Albumin: 3 g/dL — ABNORMAL LOW (ref 3.5–5.0)
Alkaline Phosphatase: 65 U/L (ref 38–126)
Anion gap: 11 (ref 5–15)
BUN: 11 mg/dL (ref 6–20)
CHLORIDE: 97 mmol/L — AB (ref 101–111)
CO2: 23 mmol/L (ref 22–32)
Calcium: 8.6 mg/dL — ABNORMAL LOW (ref 8.9–10.3)
Creatinine, Ser: 1.24 mg/dL — ABNORMAL HIGH (ref 0.44–1.00)
GFR, EST AFRICAN AMERICAN: 45 mL/min — AB (ref >60)
GFR, EST NON AFRICAN AMERICAN: 39 mL/min — AB (ref >60)
Glucose, Bld: 139 mg/dL — ABNORMAL HIGH (ref 65–99)
POTASSIUM: 4.4 mmol/L (ref 3.5–5.1)
SODIUM: 131 mmol/L — AB (ref 135–145)
Total Bilirubin: 0.5 mg/dL (ref 0.3–1.2)
Total Protein: 6.6 g/dL (ref 6.5–8.1)

## 2017-09-16 LAB — LIPASE, BLOOD: LIPASE: 17 U/L (ref 11–51)

## 2017-09-16 MED ORDER — SODIUM CHLORIDE 0.9 % IV SOLN
INTRAVENOUS | Status: AC
  Administered 2017-09-16 – 2017-09-20 (×9): via INTRAVENOUS

## 2017-09-16 MED ORDER — ONDANSETRON HCL 4 MG/2ML IJ SOLN
4.0000 mg | Freq: Four times a day (QID) | INTRAMUSCULAR | Status: DC | PRN
  Administered 2017-09-24 – 2017-09-25 (×2): 4 mg via INTRAVENOUS
  Filled 2017-09-16 (×2): qty 2

## 2017-09-16 MED ORDER — ONDANSETRON HCL 4 MG/2ML IJ SOLN
4.0000 mg | Freq: Once | INTRAMUSCULAR | Status: AC
  Administered 2017-09-16: 4 mg via INTRAVENOUS
  Filled 2017-09-16: qty 2

## 2017-09-16 MED ORDER — MORPHINE SULFATE (PF) 4 MG/ML IV SOLN
2.0000 mg | INTRAVENOUS | Status: DC | PRN
  Administered 2017-09-17: 4 mg via INTRAVENOUS
  Administered 2017-09-19 – 2017-09-24 (×4): 2 mg via INTRAVENOUS
  Administered 2017-09-25 (×2): 4 mg via INTRAVENOUS
  Filled 2017-09-16 (×8): qty 1

## 2017-09-16 MED ORDER — HYPROMELLOSE (GONIOSCOPIC) 2.5 % OP SOLN
1.0000 [drp] | Freq: Every day | OPHTHALMIC | Status: DC | PRN
  Filled 2017-09-16: qty 15

## 2017-09-16 MED ORDER — ENOXAPARIN SODIUM 40 MG/0.4ML ~~LOC~~ SOLN
40.0000 mg | SUBCUTANEOUS | Status: AC
  Administered 2017-09-16 – 2017-09-17 (×2): 40 mg via SUBCUTANEOUS
  Filled 2017-09-16 (×2): qty 0.4

## 2017-09-16 MED ORDER — ACETAMINOPHEN 650 MG RE SUPP: 650.0000 mg | Freq: Four times a day (QID) | RECTAL | Status: DC | PRN

## 2017-09-16 MED ORDER — METHYLPREDNISOLONE SODIUM SUCC 125 MG IJ SOLR
125.0000 mg | Freq: Once | INTRAMUSCULAR | Status: AC
  Administered 2017-09-16: 125 mg via INTRAVENOUS
  Filled 2017-09-16: qty 2

## 2017-09-16 MED ORDER — METHYLPREDNISOLONE SODIUM SUCC 125 MG IJ SOLR
60.0000 mg | Freq: Every day | INTRAMUSCULAR | Status: DC
  Administered 2017-09-17 – 2017-09-25 (×8): 60 mg via INTRAVENOUS
  Filled 2017-09-16 (×4): qty 2
  Filled 2017-09-16: qty 0.96
  Filled 2017-09-16 (×3): qty 2

## 2017-09-16 MED ORDER — PANTOPRAZOLE SODIUM 40 MG PO TBEC
40.0000 mg | DELAYED_RELEASE_TABLET | Freq: Every day | ORAL | Status: DC
  Administered 2017-09-17: 40 mg via ORAL
  Filled 2017-09-16: qty 1

## 2017-09-16 MED ORDER — KCL IN DEXTROSE-NACL 20-5-0.45 MEQ/L-%-% IV SOLN
Freq: Once | INTRAVENOUS | Status: DC
  Filled 2017-09-16 (×2): qty 1000

## 2017-09-16 MED ORDER — IOPAMIDOL (ISOVUE-300) INJECTION 61%
100.0000 mL | Freq: Once | INTRAVENOUS | Status: AC | PRN
  Administered 2017-09-16: 100 mL via INTRAVENOUS

## 2017-09-16 MED ORDER — OXYBUTYNIN CHLORIDE 5 MG PO TABS
5.0000 mg | ORAL_TABLET | Freq: Every day | ORAL | Status: DC
  Administered 2017-09-17: 5 mg via ORAL
  Filled 2017-09-16: qty 1

## 2017-09-16 MED ORDER — LOSARTAN POTASSIUM 50 MG PO TABS
50.0000 mg | ORAL_TABLET | Freq: Every day | ORAL | Status: DC
  Administered 2017-09-16 – 2017-09-17 (×2): 50 mg via ORAL
  Filled 2017-09-16 (×2): qty 1

## 2017-09-16 MED ORDER — ACETAMINOPHEN 325 MG PO TABS: 650.0000 mg | ORAL_TABLET | Freq: Four times a day (QID) | ORAL | Status: DC | PRN

## 2017-09-16 MED ORDER — LEVOTHYROXINE SODIUM 100 MCG PO TABS
100.0000 ug | ORAL_TABLET | Freq: Every day | ORAL | Status: DC
  Administered 2017-09-17: 100 ug via ORAL
  Filled 2017-09-16: qty 1

## 2017-09-16 MED ORDER — SODIUM CHLORIDE 0.9 % IV BOLUS (SEPSIS)
500.0000 mL | Freq: Once | INTRAVENOUS | Status: AC
  Administered 2017-09-16: 500 mL via INTRAVENOUS

## 2017-09-16 MED ORDER — IOPAMIDOL (ISOVUE-M 300) INJECTION 61%: 15.0000 mL | Freq: Once | INTRAMUSCULAR | Status: DC | PRN

## 2017-09-16 MED ORDER — ONDANSETRON HCL 4 MG PO TABS: 4.0000 mg | ORAL_TABLET | Freq: Four times a day (QID) | ORAL | Status: DC | PRN

## 2017-09-16 MED ORDER — MORPHINE SULFATE (PF) 4 MG/ML IV SOLN
2.0000 mg | Freq: Once | INTRAVENOUS | Status: AC
  Administered 2017-09-16: 2 mg via INTRAVENOUS
  Filled 2017-09-16: qty 1

## 2017-09-16 MED ORDER — CLOPIDOGREL BISULFATE 75 MG PO TABS
75.0000 mg | ORAL_TABLET | Freq: Every day | ORAL | Status: DC
  Filled 2017-09-16: qty 1

## 2017-09-16 MED ORDER — FOLIC ACID 1 MG PO TABS
1.0000 mg | ORAL_TABLET | Freq: Every day | ORAL | Status: DC
  Administered 2017-09-17: 1 mg via ORAL
  Filled 2017-09-16 (×3): qty 1

## 2017-09-16 NOTE — ED Triage Notes (Signed)
Pt. Stated, I have Crohn's disease and Ive had N/V for the last 3-4 days and Ive not eaten or drink because of being afraid to vomit.  Ive had an obstruction before.

## 2017-09-16 NOTE — ED Provider Notes (Signed)
[facility] EMERGENCY DEPARTMENT Provider Note   CSN: [id number] Arrival date & time: 09/16/17  1454     History   Chief Complaint Chief Complaint  Patient presents with  . Abdominal Pain  . Nausea  . Emesis    HPI Terri [name] is a 83 y.o. female with a history of Crohn's disease, GERD, HTN, and sbo, last admitted 7/18, presenting with a 4 day history of nausea/vomiting and increasing abdominal pain and distention.  She sees Dr Watt Climes and underwent a CT abd on 10/23 which revealed Crohns changes but also omental nodularity and a mass in her right lower abdomen suspicious for malignancy.  She is anticipating an outpatient biopsy in 3 days for further diagnosis.  She denies fever, chills, diarrhea.  She endorses dehydration and weight loss this past week due to lack of intake. She has found no alleviators.  The history is provided by the patient.    Past Medical History:  Diagnosis Date  . Age-related macular degeneration, dry, both eyes   . Aortic insufficiency   . Aortic stenosis   . Arthritis    "hands" (05/31/2017)  . Asthma   . Basal cell carcinoma of nose   . Crohn's disease ([place])   . GERD (gastroesophageal reflux disease)   . History of duodenal ulcer   . History of stomach ulcers   . HOH (hard of hearing)   . Hx of rheumatic fever   . Hypertension   . Hypothyroidism   . Positive TB test   . Rheumatic heart disease   . Small bowel obstruction ([place]) 05/31/2017  . Squamous cell cancer of skin of nose   . TIA (transient ischemic attack)   . TIA (transient ischemic attack) 1999?    Patient Active Problem List   Diagnosis Date Noted  . Foreign body of small intestine, subsequent encounter 09/16/2017  . Abnormal CT of the abdomen 09/16/2017  . SBO (small bowel obstruction) ([place]) 05/31/2017  . Crohn's disease ([place]) 05/31/2017  . Hx-TIA (transient ischemic attack) 05/31/2017  . HOH (hard of hearing) 05/31/2017  . AKI (acute kidney injury)  ([place]) 05/31/2017  . Leukocytosis 05/31/2017  . Bilious vomiting with nausea   . Crohn's ileitis, with intestinal obstruction ([place])   . Dehydration     Past Surgical History:  Procedure Laterality Date  . BASAL CELL CARCINOMA EXCISION  X 2   "face"  . CARPAL TUNNEL RELEASE Right   . CATARACT EXTRACTION W/ INTRAOCULAR LENS IMPLANT Bilateral   . CHOLECYSTECTOMY    . SQUAMOUS CELL CARCINOMA EXCISION  X1   "face"  . TONSILLECTOMY      OB History    No data available       Home Medications    Prior to Admission medications   Medication Sig Start Date End Date Taking? Authorizing Provider  carboxymethylcellulose (REFRESH PLUS) 0.5 % SOLN Place 1 drop into both eyes daily as needed (dry eyes).    [provider]  clopidogrel (PLAVIX) 75 MG tablet Take 75 mg by mouth daily. 04/26/17   [provider]  folic acid (FOLVITE) 1 MG tablet Take 1 mg by mouth daily.    [provider]  levothyroxine (SYNTHROID, LEVOTHROID) 100 MCG tablet Take 100 mcg by mouth daily before breakfast.    [provider]  losartan (COZAAR) 50 MG tablet Take 50 mg by mouth daily. 04/26/17   [provider]  Multiple Vitamin (MULTIVITAMIN WITH MINERALS) TABS tablet Take 1 tablet by  mouth daily.    [provider]  ondansetron (ZOFRAN) 4 MG tablet Take 1 tablet (4 mg total) by mouth every 6 (six) hours as needed for nausea. 06/03/17   Nita Sells, MD  oxybutynin (DITROPAN) 5 MG tablet Take 5 mg by mouth daily.    [provider]  pantoprazole (PROTONIX) 40 MG tablet Take 40 mg by mouth every morning. 05/15/17   [provider]  predniSONE (DELTASONE) 20 MG tablet Take 3 tablets (60 mg total) by mouth daily before breakfast. 06/03/17   Nita Sells, MD  predniSONE (DELTASONE) 50 MG tablet Take this course after 7/29 06/03/17   Nita Sells, MD    Family History No family history on file.  Social History Social History    Tobacco Use  . Smoking status: Former Smoker    Packs/day: 0.10    Years: 15.00    Pack years: 1.50    Types: Cigarettes    Last attempt to quit: 1969    Years since quitting: 49.8  . Smokeless tobacco: Never Used  Substance Use Topics  . Alcohol use: No  . Drug use: No     Allergies   Patient has no known allergies.   Review of Systems Review of Systems   Physical Exam Updated Vital Signs BP 126/78   Pulse (!) 121   Temp 98.2 F (36.8 C) (Oral)   Resp 16   Ht 5' 2.75" (1.594 m)   Wt 72.6 kg (160 lb)   SpO2 92%   BMI 28.57 kg/m   Physical Exam   ED Treatments / Results  Labs (all labs ordered are listed, but only abnormal results are displayed) Labs Reviewed  COMPREHENSIVE METABOLIC PANEL - Abnormal; Notable for the following components:      Result Value   Sodium 131 (*)    Chloride 97 (*)    Glucose, Bld 139 (*)    Creatinine, Ser 1.24 (*)    Calcium 8.6 (*)    Albumin 3.0 (*)    AST 13 (*)    ALT 8 (*)    GFR calc non Af Amer 39 (*)    GFR calc Af Amer 45 (*)    All other components within normal limits  CBC - Abnormal; Notable for the following components:   WBC 12.3 (*)    All other components within normal limits  LIPASE, BLOOD  URINALYSIS, ROUTINE W REFLEX MICROSCOPIC  CBC  BASIC METABOLIC PANEL    EKG  EKG Interpretation None       Radiology Dg Abdomen Acute W/chest  Result Date: 09/16/2017 CLINICAL DATA:  Initial evaluation for acute vomiting, abdominal pain. EXAM: DG ABDOMEN ACUTE W/ 1V CHEST COMPARISON:  Prior CT from 09/04/2017. FINDINGS: Cardiac and mediastinal silhouettes within normal limits. Lungs hypoinflated. Mild bibasilar atelectasis. No focal infiltrates. No pulmonary edema or pleural effusion. No pneumothorax. Multiple dilated gas-filled loops of small bowel seen within the upper mid and left abdomen. These measure up to 5.2 cm. Associated air-fluid levels. Findings consistent with small bowel obstruction. No soft  tissue mass or abnormal calcification. No free air. IMPRESSION: 1. Multiple dilated gas-filled loops of small bowel with associated air-fluid levels, consistent with small bowel obstruction. 2. Shallow lung inflation with mild bibasilar atelectasis. Electronically Signed   By: Jeannine Boga M.D.   On: 09/16/2017 19:16    Procedures Procedures (including critical care time)  Medications Ordered in ED Medications  sodium chloride 0.9 % bolus 500 mL (not administered)  dextrose  5 % and 0.45 % NaCl with KCl 20 mEq/L infusion (not administered)  methylPREDNISolone sodium succinate (SOLU-MEDROL) 125 mg/2 mL injection 125 mg (not administered)  methylPREDNISolone sodium succinate (SOLU-MEDROL) 125 mg/2 mL injection 60 mg (not administered)  morphine 4 MG/ML injection 2-4 mg (not administered)  acetaminophen (TYLENOL) tablet 650 mg (not administered)    Or  acetaminophen (TYLENOL) suppository 650 mg (not administered)  ondansetron (ZOFRAN) tablet 4 mg (not administered)    Or  ondansetron (ZOFRAN) injection 4 mg (not administered)  enoxaparin (LOVENOX) injection 40 mg (not administered)  0.9 %  sodium chloride infusion (not administered)  ondansetron (ZOFRAN) injection 4 mg (4 mg Intravenous Given 09/16/17 1907)  morphine 4 MG/ML injection 2 mg (2 mg Intravenous Given 09/16/17 1909)     Initial Impression / Assessment and Plan / ED Course  I have reviewed the triage vital signs and the nursing notes.  Pertinent labs & imaging results that were available during my care of the patient were reviewed by me and considered in my medical decision making (see chart for details).     Discussed with Dr. Paulita Fujita with [facility], would recommend repeat CT imaging for pt to better define nature of obstruction. Medical admission, call placed to the hospitalist group for admission.  Final Clinical Impressions(s) / ED Diagnoses   Final diagnoses:  SBO (small bowel obstruction) ([place])    New  Prescriptions This SmartLink is deprecated. Use AVSMEDLIST instead to display the medication list for a patient.   Evalee Jefferson, PA-C 09/16/17 2025    Dorie Rank, MD 09/18/17 [phone]

## 2017-09-16 NOTE — ED Notes (Signed)
Dr. Alcario Drought is aware of pt's HR.

## 2017-09-16 NOTE — H&P (Signed)
History and Physical    Terri [name] ZOX:[id number] DOB: [birth year] DOA: 09/16/2017  PCP: Prince Solian, MD  Patient coming from: Home  I have personally briefly reviewed patient's old medical records in [place]  Chief Complaint: Abd pain  HPI: Terri [name] is a 84 y.o. female with medical history significant of Crohn's disease diagnosed x5 years ago.  SBO in July this year felt to be secondary to Crohn's disease.  SBO resolved / improved after NGT and steroids though she has been having some abdominal symptoms in the intervening time.  Patient presents to the ED with c/o nausea, abd distention, concern for another SBO.  Of note patient just had CT scan on 10/23 which showed findings suggestive of Crohns, omental nodularity worrisome for malignancy (patient had biopsy scheduled at [facility] on Wed this week), and foreign body in terminal ilium, possibly a pill or tablet that has been present since CT scan in July of this year.   ED Course: NGT placed, GI recs repeat CT and will see in AM.  Put on solumedrol 125mg .   Review of Systems: As per HPI otherwise 10 point review of systems negative.   Past Medical History:  Diagnosis Date  . Age-related macular degeneration, dry, both eyes   . Aortic insufficiency   . Aortic stenosis   . Arthritis    "hands" (05/31/2017)  . Asthma   . Basal cell carcinoma of nose   . Crohn's disease ([place])   . GERD (gastroesophageal reflux disease)   . History of duodenal ulcer   . History of stomach ulcers   . HOH (hard of hearing)   . Hx of rheumatic fever   . Hypertension   . Hypothyroidism   . Positive TB test   . Rheumatic heart disease   . Small bowel obstruction ([place]) 05/31/2017  . Squamous cell cancer of skin of nose   . TIA (transient ischemic attack)   . TIA (transient ischemic attack) 1999?    Past Surgical History:  Procedure Laterality Date  . BASAL CELL CARCINOMA EXCISION  X 2   "face"  . CARPAL TUNNEL RELEASE  Right   . CATARACT EXTRACTION W/ INTRAOCULAR LENS IMPLANT Bilateral   . CHOLECYSTECTOMY    . SQUAMOUS CELL CARCINOMA EXCISION  X1   "face"  . TONSILLECTOMY       reports that she quit smoking about 49 years ago. Her smoking use included cigarettes. She has a 1.50 pack-year smoking history. she has never used smokeless tobacco. She reports that she does not drink alcohol or use drugs.  No Known Allergies  Family History  Problem Relation Age of Onset  . Bladder Cancer Brother   . Breast cancer Daughter   . Crohn's disease Neg Hx      Prior to Admission medications   Medication Sig Start Date End Date Taking? Authorizing Provider  carboxymethylcellulose (REFRESH PLUS) 0.5 % SOLN Place 1 drop into both eyes daily as needed (dry eyes).    [provider]  clopidogrel (PLAVIX) 75 MG tablet Take 75 mg by mouth daily. 04/26/17   [provider]  folic acid (FOLVITE) 1 MG tablet Take 1 mg by mouth daily.    [provider]  levothyroxine (SYNTHROID, LEVOTHROID) 100 MCG tablet Take 100 mcg by mouth daily before breakfast.    [provider]  losartan (COZAAR) 50 MG tablet Take 50 mg by mouth daily. 04/26/17   [provider]  Multiple Vitamin (MULTIVITAMIN WITH MINERALS) TABS  tablet Take 1 tablet by mouth daily.    [provider]  ondansetron (ZOFRAN) 4 MG tablet Take 1 tablet (4 mg total) by mouth every 6 (six) hours as needed for nausea. 06/03/17   Nita Sells, MD  oxybutynin (DITROPAN) 5 MG tablet Take 5 mg by mouth daily.    [provider]  pantoprazole (PROTONIX) 40 MG tablet Take 40 mg by mouth every morning. 05/15/17   [provider]  predniSONE (DELTASONE) 20 MG tablet Take 3 tablets (60 mg total) by mouth daily before breakfast. 06/03/17   Nita Sells, MD  predniSONE (DELTASONE) 50 MG tablet Take this course after 7/29 06/03/17   Nita Sells, MD    Physical Exam: Vitals:   09/16/17 1500  09/16/17 1630 09/16/17 1715 09/16/17 1915  BP: 117/81 (!) 109/92 124/84 126/78  Pulse: (!) 104 84 (!) 122 (!) 121  Resp: 16  16   Temp: 98.2 F (36.8 C)     TempSrc: Oral     SpO2: 95% 90% 92% 92%  Weight: 72.6 kg (160 lb)     Height: 5' 2.75" (1.594 m)       Constitutional: NAD, calm, comfortable Eyes: PERRL, lids and conjunctivae normal ENMT: Mucous membranes are moist. Posterior pharynx clear of any exudate or lesions.Normal dentition.  Neck: normal, supple, no masses, no thyromegaly Respiratory: clear to auscultation bilaterally, no wheezing, no crackles. Normal respiratory effort. No accessory muscle use.  Cardiovascular: Regular rate and rhythm, no murmurs / rubs / gallops. No extremity edema. 2+ pedal pulses. No carotid bruits.  Abdomen: no tenderness, no masses palpated. No hepatosplenomegaly. Bowel sounds positive.  Musculoskeletal: no clubbing / cyanosis. No joint deformity upper and lower extremities. Good ROM, no contractures. Normal muscle tone.  Skin: no rashes, lesions, ulcers. No induration Neurologic: CN 2-12 grossly intact. Sensation intact, DTR normal. Strength 5/5 in all 4.  Psychiatric: Normal judgment and insight. Alert and oriented x 3. Normal mood.    Labs on Admission: I have personally reviewed following labs and imaging studies  CBC: Recent Labs  Lab 09/16/17 1507  WBC 12.3*  HGB 12.6  HCT 39.0  MCV 87.1  PLT 010   Basic Metabolic Panel: Recent Labs  Lab 09/16/17 1507  NA 131*  K 4.4  CL 97*  CO2 23  GLUCOSE 139*  BUN 11  CREATININE 1.24*  CALCIUM 8.6*   GFR: Estimated Creatinine Clearance: 32 mL/min (A) (by C-G formula based on SCr of 1.24 mg/dL (H)). Liver Function Tests: Recent Labs  Lab 09/16/17 1507  AST 13*  ALT 8*  ALKPHOS 65  BILITOT 0.5  PROT 6.6  ALBUMIN 3.0*   Recent Labs  Lab 09/16/17 1507  LIPASE 17   No results for input(s): AMMONIA in the last 168 hours. Coagulation Profile: No results for input(s): INR,  PROTIME in the last 168 hours. Cardiac Enzymes: No results for input(s): CKTOTAL, CKMB, CKMBINDEX, TROPONINI in the last 168 hours. BNP (last 3 results) No results for input(s): PROBNP in the last 8760 hours. HbA1C: No results for input(s): HGBA1C in the last 72 hours. CBG: No results for input(s): GLUCAP in the last 168 hours. Lipid Profile: No results for input(s): CHOL, HDL, LDLCALC, TRIG, CHOLHDL, LDLDIRECT in the last 72 hours. Thyroid Function Tests: No results for input(s): TSH, T4TOTAL, FREET4, T3FREE, THYROIDAB in the last 72 hours. Anemia Panel: No results for input(s): VITAMINB12, FOLATE, FERRITIN, TIBC, IRON, RETICCTPCT in the last 72 hours. Urine analysis:    Component Value  Date/Time   COLORURINE YELLOW 05/31/2017 1438   APPEARANCEUR CLEAR 05/31/2017 1438   LABSPEC >1.046 (H) 05/31/2017 1438   PHURINE 5.0 05/31/2017 1438   GLUCOSEU NEGATIVE 05/31/2017 1438   HGBUR NEGATIVE 05/31/2017 1438   BILIRUBINUR NEGATIVE 05/31/2017 1438   KETONESUR 5 (A) 05/31/2017 1438   PROTEINUR NEGATIVE 05/31/2017 1438   NITRITE NEGATIVE 05/31/2017 1438   LEUKOCYTESUR NEGATIVE 05/31/2017 1438    Radiological Exams on Admission: Dg Abdomen Acute W/chest  Result Date: 09/16/2017 CLINICAL DATA:  Initial evaluation for acute vomiting, abdominal pain. EXAM: DG ABDOMEN ACUTE W/ 1V CHEST COMPARISON:  Prior CT from 09/04/2017. FINDINGS: Cardiac and mediastinal silhouettes within normal limits. Lungs hypoinflated. Mild bibasilar atelectasis. No focal infiltrates. No pulmonary edema or pleural effusion. No pneumothorax. Multiple dilated gas-filled loops of small bowel seen within the upper mid and left abdomen. These measure up to 5.2 cm. Associated air-fluid levels. Findings consistent with small bowel obstruction. No soft tissue mass or abnormal calcification. No free air. IMPRESSION: 1. Multiple dilated gas-filled loops of small bowel with associated air-fluid levels, consistent with small bowel  obstruction. 2. Shallow lung inflation with mild bibasilar atelectasis. Electronically Signed   By: Jeannine Boga M.D.   On: 09/16/2017 19:16    EKG: Independently reviewed.  Assessment/Plan Principal Problem:   SBO (small bowel obstruction) ([facility]) Active Problems:   Crohn's disease ([place])   Crohn's ileitis, with intestinal obstruction ([place])   Foreign body of small intestine, subsequent encounter   Abnormal CT of the abdomen    1. SBO - 1. NGT 2. NPO 3. IVF 4. GI to see in AM given the complex picture (see issues 2-4 below). 2. Crohn's disease - possibly flare causing SBO 1. Getting solumedrol 125 in ED 2. Will put on solumedrol 60 Daily 3. Foreign body of small intestine - 1. Unclear if this could be stuck at illeocecal valve and contributing to obstruction or not 1. Is known that she has narrowing at illeocecal valve on prior colonoscopy (they couldn't pass a peds colonoscope through ileocecal valve when they tried previously) 2. CT scan to further try and identify transition point of obstruction 3. GI to see patient in AM 4. Omental nodularity - 1. Repeat CT today 2. Plan was for Biopsy at [facility] on Wed 3. However if nodularity re-demonstrated on CT today, likely will just want to get this done during inpatient admission since she may be here at [facility] on Wed.  DVT prophylaxis: Lovenox Code Status: Full Family Communication: Family at bedside Disposition Plan: Home after admit Consults called: GI Admission status: Admit to inpatient   [place], [place] [facility] Pager [phone]  If 7AM-7PM, please contact day team taking care of patient [email or web address] Password [email or web address]  09/16/2017, 9:02 PM

## 2017-09-16 NOTE — ED Provider Notes (Signed)
Pt presents to the ED with nausea, abdominal distension.  She was concerned she had another bowel obstruction.  Last time it was felt to be related to her crohns disease.  Her sx resolved after steroids and ng tube.    AAS is consistent with recurrent bowel obstruction.   GI consulted.  Requests CT scan. Plan on medical admission.  NG tube ordered in the ED  Medical screening examination/treatment/procedure(s) were conducted as a shared visit with non-physician practitioner(s) and myself.  I personally evaluated the patient during the encounter.     Dorie Rank, MD 09/16/17 [phone]

## 2017-09-17 ENCOUNTER — Other Ambulatory Visit: Payer: Self-pay

## 2017-09-17 DIAGNOSIS — R000 Tachycardia, unspecified: Secondary | ICD-10-CM

## 2017-09-17 DIAGNOSIS — Z87891 Personal history of nicotine dependence: Secondary | ICD-10-CM

## 2017-09-17 DIAGNOSIS — E039 Hypothyroidism, unspecified: Secondary | ICD-10-CM

## 2017-09-17 DIAGNOSIS — I10 Essential (primary) hypertension: Secondary | ICD-10-CM

## 2017-09-17 DIAGNOSIS — Z8673 Personal history of transient ischemic attack (TIA), and cerebral infarction without residual deficits: Secondary | ICD-10-CM

## 2017-09-17 DIAGNOSIS — I82409 Acute embolism and thrombosis of unspecified deep veins of unspecified lower extremity: Secondary | ICD-10-CM

## 2017-09-17 DIAGNOSIS — Z8719 Personal history of other diseases of the digestive system: Secondary | ICD-10-CM

## 2017-09-17 DIAGNOSIS — Z7901 Long term (current) use of anticoagulants: Secondary | ICD-10-CM

## 2017-09-17 DIAGNOSIS — I350 Nonrheumatic aortic (valve) stenosis: Secondary | ICD-10-CM

## 2017-09-17 DIAGNOSIS — K50912 Crohn's disease, unspecified, with intestinal obstruction: Secondary | ICD-10-CM

## 2017-09-17 LAB — BASIC METABOLIC PANEL
ANION GAP: 9 (ref 5–15)
BUN: 12 mg/dL (ref 6–20)
CHLORIDE: 101 mmol/L (ref 101–111)
CO2: 24 mmol/L (ref 22–32)
Calcium: 7.8 mg/dL — ABNORMAL LOW (ref 8.9–10.3)
Creatinine, Ser: 1.2 mg/dL — ABNORMAL HIGH (ref 0.44–1.00)
GFR calc Af Amer: 47 mL/min — ABNORMAL LOW (ref >60)
GFR calc non Af Amer: 40 mL/min — ABNORMAL LOW (ref >60)
GLUCOSE: 130 mg/dL — AB (ref 65–99)
POTASSIUM: 4.3 mmol/L (ref 3.5–5.1)
Sodium: 134 mmol/L — ABNORMAL LOW (ref 135–145)

## 2017-09-17 LAB — URINALYSIS, ROUTINE W REFLEX MICROSCOPIC
Bilirubin Urine: NEGATIVE
GLUCOSE, UA: NEGATIVE mg/dL
HGB URINE DIPSTICK: NEGATIVE
KETONES UR: NEGATIVE mg/dL
LEUKOCYTES UA: NEGATIVE
Nitrite: NEGATIVE
PH: 5 (ref 5.0–8.0)
PROTEIN: NEGATIVE mg/dL
Specific Gravity, Urine: 1.044 — ABNORMAL HIGH (ref 1.005–1.030)

## 2017-09-17 LAB — CBC
HCT: 33.9 % — ABNORMAL LOW (ref 36.0–46.0)
HEMOGLOBIN: 10.9 g/dL — AB (ref 12.0–15.0)
MCH: 28.2 pg (ref 26.0–34.0)
MCHC: 32.2 g/dL (ref 30.0–36.0)
MCV: 87.6 fL (ref 78.0–100.0)
Platelets: 209 10*3/uL (ref 150–400)
RBC: 3.87 MIL/uL (ref 3.87–5.11)
RDW: 13.7 % (ref 11.5–15.5)
WBC: 9.3 10*3/uL (ref 4.0–10.5)

## 2017-09-17 MED ORDER — ENOXAPARIN SODIUM 40 MG/0.4ML ~~LOC~~ SOLN
40.0000 mg | SUBCUTANEOUS | Status: DC
  Administered 2017-09-19 – 2017-09-29 (×11): 40 mg via SUBCUTANEOUS
  Filled 2017-09-17 (×11): qty 0.4

## 2017-09-17 MED ORDER — ORAL CARE MOUTH RINSE
15.0000 mL | Freq: Two times a day (BID) | OROMUCOSAL | Status: DC
  Administered 2017-09-17 – 2017-09-29 (×21): 15 mL via OROMUCOSAL

## 2017-09-17 MED ORDER — WHITE PETROLATUM EX OINT
TOPICAL_OINTMENT | CUTANEOUS | Status: AC
  Administered 2017-09-17: 05:00:00
  Filled 2017-09-17: qty 28.35

## 2017-09-17 NOTE — Progress Notes (Signed)
PROGRESS NOTE    Terri [name]  HEN:[id number] DOB: [birth year] DOA: 09/16/2017 PCP: Prince Solian, MD   Chief Complaint  Patient presents with  . Abdominal Pain  . Nausea  . Emesis    Brief Narrative:  HPI on 09/16/2017 by Dr. Jolene Schimke Gielow is a 83 y.o. female with medical history significant of Crohn's disease diagnosed x5 years ago.  SBO in July this year felt to be secondary to Crohn's disease.  SBO resolved / improved after NGT and steroids though she has been having some abdominal symptoms in the intervening time.  Patient presents to the ED with c/o nausea, abd distention, concern for another SBO.  Of note patient just had CT scan on 10/23 which showed findings suggestive of Crohns, omental nodularity worrisome for malignancy (patient had biopsy scheduled at [facility] on Wed this week), and foreign body in terminal ilium, possibly a pill or tablet that has been present since CT scan in July of this year.  Assessment & Plan   Abdominal pain secondary to SBO -CT abdomen: high-grade mechanical SBO. Transition point in RLQ/pelvis and is related to a diseased segment of terminal ileum which demonstrates wall thickening and possible stricturing -Currently NPO with NG tube in place -General surgery and gastroenterology consulted and apprecaited -patient was scheduled for liver biopsy on 09/18/2017 -continue conservative management with IVF, antiemetics, pain control  Crohns Disease -Gastroenterology consulted and appreciated -ID consulted by GI for possible use of Remicade as patient had prior +TB test -Continue steroids   Foreign body in the small intestine -noted on CT abd/pelvis: 75mm foreign object in the terminal ileum -general surgery consulted and appreciated  Omental nodularity  -patient was scheduled for liver or some type of biopsy on 09/18/2017 -Interventional radiology consulted and appreciated\  Hypothyroidism -Continue Synthroid  DVT  Prophylaxis  lovenox  Code Status: Full  Family Communication: None at bedside  Disposition Plan: Admitted, pending further recommendations   Consultants General surgery Gastroenterology Interventional radiology  Procedures  None  Antibiotics   Anti-infectives (From admission, onward)   None      Subjective:   Terri [name] seen and examined today.  Feels abdominal pain and nausea have improved since having the NG tube placed. Denies chest pain, shortness of breath, dizziness, headache.    Objective:   Vitals:   09/16/17 2045 09/16/17 2100 09/16/17 2236 09/17/17 0449  BP: 120/90 126/80 126/80 129/61  Pulse: (!) 110 85 84 81  Resp: (!) 24 16 16 14   Temp:   98.8 F (37.1 C) 98.4 F (36.9 C)  TempSrc:   Oral Oral  SpO2: 94%  97% 95%  Weight:   72.6 kg (160 lb)   Height:   5\' 2"  (1.575 m)     Intake/Output Summary (Last 24 hours) at 09/17/2017 1340 Last data filed at 09/17/2017 1100 Gross per 24 hour  Intake 1183.33 ml  Output 1275 ml  Net -91.67 ml   Filed Weights   09/16/17 1500 09/16/17 2236  Weight: 72.6 kg (160 lb) 72.6 kg (160 lb)    Exam  General: Well developed, well nourished, NAD, appears stated age  HEENT: NCAT, mucous membranes moist. NG tube in pace  Cardiovascular: S1 S2 auscultated, soft SEM, Regular rate and rhythm.  Respiratory: Clear to auscultation bilaterally with equal chest rise  Abdomen: Soft, mildly TTP, nondistended, minimal bowel sounds  Extremities: warm dry without cyanosis clubbing or edema  Neuro: AAOx3, nonfocal  Psych: Normal affect and demeanor with intact judgement and  insight   Data Reviewed: I have personally reviewed following labs and imaging studies  CBC: Recent Labs  Lab 09/16/17 1507 09/17/17 0711  WBC 12.3* 9.3  HGB 12.6 10.9*  HCT 39.0 33.9*  MCV 87.1 87.6  PLT 279 176   Basic Metabolic Panel: Recent Labs  Lab 09/16/17 1507 09/17/17 0711  NA 131* 134*  K 4.4 4.3  CL 97* 101  CO2 23  24  GLUCOSE 139* 130*  BUN 11 12  CREATININE 1.24* 1.20*  CALCIUM 8.6* 7.8*   GFR: Estimated Creatinine Clearance: 32.6 mL/min (A) (by C-G formula based on SCr of 1.2 mg/dL (H)). Liver Function Tests: Recent Labs  Lab 09/16/17 1507  AST 13*  ALT 8*  ALKPHOS 65  BILITOT 0.5  PROT 6.6  ALBUMIN 3.0*   Recent Labs  Lab 09/16/17 1507  LIPASE 17   No results for input(s): AMMONIA in the last 168 hours. Coagulation Profile: No results for input(s): INR, PROTIME in the last 168 hours. Cardiac Enzymes: No results for input(s): CKTOTAL, CKMB, CKMBINDEX, TROPONINI in the last 168 hours. BNP (last 3 results) No results for input(s): PROBNP in the last 8760 hours. HbA1C: No results for input(s): HGBA1C in the last 72 hours. CBG: No results for input(s): GLUCAP in the last 168 hours. Lipid Profile: No results for input(s): CHOL, HDL, LDLCALC, TRIG, CHOLHDL, LDLDIRECT in the last 72 hours. Thyroid Function Tests: No results for input(s): TSH, T4TOTAL, FREET4, T3FREE, THYROIDAB in the last 72 hours. Anemia Panel: No results for input(s): VITAMINB12, FOLATE, FERRITIN, TIBC, IRON, RETICCTPCT in the last 72 hours. Urine analysis:    Component Value Date/Time   COLORURINE YELLOW 09/17/2017 0425   APPEARANCEUR CLEAR 09/17/2017 0425   LABSPEC 1.044 (H) 09/17/2017 0425   PHURINE 5.0 09/17/2017 0425   GLUCOSEU NEGATIVE 09/17/2017 0425   HGBUR NEGATIVE 09/17/2017 0425   BILIRUBINUR NEGATIVE 09/17/2017 0425   KETONESUR NEGATIVE 09/17/2017 0425   PROTEINUR NEGATIVE 09/17/2017 0425   NITRITE NEGATIVE 09/17/2017 0425   LEUKOCYTESUR NEGATIVE 09/17/2017 0425   Sepsis Labs: @LABRCNTIP (procalcitonin:4,lacticidven:4)  )No results found for this or any previous visit (from the past 240 hour(s)).    Radiology Studies: Ct Abdomen Pelvis W Contrast  Result Date: 09/16/2017 CLINICAL DATA:  Bowel obstruction EXAM: CT ABDOMEN AND PELVIS WITH CONTRAST TECHNIQUE: Multidetector CT imaging of the  abdomen and pelvis was performed using the standard protocol following bolus administration of intravenous contrast. CONTRAST:  153mL ISOVUE-300 IOPAMIDOL (ISOVUE-300) INJECTION 61% COMPARISON:  09/16/2017, 09/04/2017, 05/31/2017 FINDINGS: Lower chest: Lung bases demonstrate no acute consolidation or pleural effusion. Heart size upper normal. Atherosclerotic calcifications. Hepatobiliary: Surgical absence of gallbladder. Mild intra and extrahepatic biliary enlargement similar compared to prior. Pancreas: Atrophic.  No inflammation Spleen: Normal in size without focal abnormality. Adrenals/Urinary Tract: Adrenal glands are within normal limits. Multifocal cortical scarring in the kidneys. No hydronephrosis. The bladder is within normal limits Stomach/Bowel: Esophageal tube is present, the tip terminates along the wall of the proximal stomach. Multiple loops of markedly dilated, fluid-filled small bowel, measuring up to 6 cm in diameter. Transition point visualized in the right pelvis, series 3, image number 69 and is related to a disease segment of terminal ileum which appears very thickened. The colon is collapsed. Re- demonstrated rectangular 17 mm foreign object in the terminal ileum. Vascular/Lymphatic: Aortic atherosclerosis. No enlarged abdominal or pelvic lymph nodes. Reproductive: Uterus and bilateral adnexa are unremarkable. Other: Negative for free air. Tiny amount of free fluid in the pelvis. Re- demonstrated  mild nodularity of the omentum most notable in the right upper quadrant. Musculoskeletal: Degenerative changes of the spine. No acute or suspicious bone lesion. IMPRESSION: 1. Findings consistent with high-grade mechanical small bowel obstruction. Transition point visualized in the right lower quadrant/pelvis and is related to a diseased segment of terminal ileum which demonstrates significant wall thickening and possible stricturing. Negative for free air. 2. Re- demonstrated 17 mm rectangular  foreign object in the terminal ileum. 3. Mild nodularity of the omentum re- demonstrated, correlation with tumor markers previously recommended. Electronically Signed   By: Donavan Foil M.D.   On: 09/16/2017 22:14   Dg Abdomen Acute W/chest  Result Date: 09/16/2017 CLINICAL DATA:  Initial evaluation for acute vomiting, abdominal pain. EXAM: DG ABDOMEN ACUTE W/ 1V CHEST COMPARISON:  Prior CT from 09/04/2017. FINDINGS: Cardiac and mediastinal silhouettes within normal limits. Lungs hypoinflated. Mild bibasilar atelectasis. No focal infiltrates. No pulmonary edema or pleural effusion. No pneumothorax. Multiple dilated gas-filled loops of small bowel seen within the upper mid and left abdomen. These measure up to 5.2 cm. Associated air-fluid levels. Findings consistent with small bowel obstruction. No soft tissue mass or abnormal calcification. No free air. IMPRESSION: 1. Multiple dilated gas-filled loops of small bowel with associated air-fluid levels, consistent with small bowel obstruction. 2. Shallow lung inflation with mild bibasilar atelectasis. Electronically Signed   By: Jeannine Boga M.D.   On: 09/16/2017 19:16     Scheduled Meds: . enoxaparin (LOVENOX) injection  40 mg Subcutaneous Q24H  . folic acid  1 mg Oral Daily  . levothyroxine  100 mcg Oral QAC breakfast  . losartan  50 mg Oral Daily  . mouth rinse  15 mL Mouth Rinse BID  . methylPREDNISolone (SOLU-MEDROL) injection  60 mg Intravenous Daily  . oxybutynin  5 mg Oral Daily  . pantoprazole  40 mg Oral Daily   Continuous Infusions: . sodium chloride 125 mL/hr at 09/17/17 1254  . dextrose 5 % and 0.45 % NaCl with KCl 20 mEq/L       LOS: 1 day   Time Spent in minutes   30 minutes  Larnie Heart D.O. on 09/17/2017 at 1:40 PM  Between 7am to 7pm - Pager - [phone]  After 7pm go to [email or web address] - password [email or web address]  And look for the night coverage person covering for me after hours  [facility] Office   [phone]

## 2017-09-17 NOTE — Consult Note (Signed)
[facility] for Infectious Disease    Date of Admission:  09/16/2017     Reason for Consult: + Quantiferon in patient needing Remicade   Referring Provider: Dr. Cristina Gong Primary Care Provider: Prince Solian, MD   Assessment: 83 yo female with Crohn's disease refractor to previous medical therapy currently being evaluated for Remicade. Previously treated LTBI with 6 months of INH treatment.   In this case her chance at a successful treatment with 6 months INH would be ~70% effective; cannot use subsequent Quantiferon's to measure this success of treatment necessarily.   Plan: She is OK with assuming risk of reactivation with Remicade and would like to do everything she can to avoid any surgery. She understands there is still a small risk for reactivation of LTBI despite adequate treatment. From our perspective OK to start this with her.   Terri Madeira, MSN, NP-C [facility] for Infectious Disease [facility] Cell: [phone] Pager: [phone]  09/17/2017  3:09 PM     Principal Problem:   SBO (small bowel obstruction) ([place]) Active Problems:   Crohn's disease ([place])   Crohn's ileitis, with intestinal obstruction ([place])   Foreign body of small intestine, subsequent encounter   Abnormal CT of the abdomen   . enoxaparin (LOVENOX) injection  40 mg Subcutaneous Q24H  . folic acid  1 mg Oral Daily  . levothyroxine  100 mcg Oral QAC breakfast  . losartan  50 mg Oral Daily  . mouth rinse  15 mL Mouth Rinse BID  . methylPREDNISolone (SOLU-MEDROL) injection  60 mg Intravenous Daily  . oxybutynin  5 mg Oral Daily  . pantoprazole  40 mg Oral Daily    HPI: Terri [name] is a 83 y.o. female admitted on 09/16/2017 with abdominal pain and SBO.   Previously diagnosed with Crohn's disease 5 years ago in [place]. Was treated there until she moved to [facility] in February and began treatment with Dr Watt Climes. She has failed several therapies and has had  recurrent SBO's, for which she is currently admitted now. Lost her husband last summer and since then she has had very difficult to control symptoms a/w her Crohn's and unable to taper off steroid therapy. Her GI team would like to consider Remicade and wanted our input since she had +Quantiferon level.   In discussion with Terri [name] she tells me she previously lived in [place] with her husband x 25 years but has been back in [place] since 1976. Lived in [place], [place] and prior to [place]. 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 Never developed symptoms and reported negative PPD testing. With original Crohn's dx she had + Quantiferon. She completed 6 months of INH/B6 therapy 4.5 years ago in [place] with the health department and tolerated it well. Denies any cough, fevers, weight loss or night sweats.   Also tells me she had general surgery discuss with her there is a "foreign object" in her ileum and considering about having this removed along with a resection. Very clear in telling me she does not want surgery and would like to use only medical therapy if possible.   Review of Systems:  Review of Systems  Constitutional: Negative for chills and fever.  HENT: Negative for tinnitus.   Eyes: Negative for blurred vision and photophobia.  Respiratory: Negative for cough and sputum production.   Cardiovascular: Negative for chest pain.  Gastrointestinal: Positive for abdominal pain, diarrhea and vomiting. Negative for nausea.  Genitourinary: Negative for dysuria.  Musculoskeletal: Negative.   Skin: Negative for rash.  Neurological: Negative for headaches.    Past Medical History:  Diagnosis Date  . Age-related macular degeneration, dry, both eyes   . Aortic insufficiency   . Aortic stenosis   . Arthritis    "hands" (05/31/2017)  . Asthma   . Basal cell carcinoma of nose   . Crohn's disease ([place])   . GERD (gastroesophageal  reflux disease)   . History of duodenal ulcer   . History of stomach ulcers   . HOH (hard of hearing)   . Hx of rheumatic fever   . Hypertension   . Hypothyroidism   . Positive TB test   . Rheumatic heart disease   . Small bowel obstruction ([place]) 05/31/2017  . Squamous cell cancer of skin of nose   . TIA (transient ischemic attack)   . TIA (transient ischemic attack) 1999?    Social History   Tobacco Use  . Smoking status: Former Smoker    Packs/day: 0.10    Years: 15.00    Pack years: 1.50    Types: Cigarettes    Last attempt to quit: 1969    Years since quitting: 49.8  . Smokeless tobacco: Never Used  Substance Use Topics  . Alcohol use: No  . Drug use: No    Family History  Problem Relation Age of Onset  . Bladder Cancer Brother   . Breast cancer Daughter   . Crohn's disease Neg Hx    No Known Allergies  OBJECTIVE: Blood pressure 129/61, pulse 81, temperature 98.4 F (36.9 C), temperature source Oral, resp. rate 14, height 5\' 2"  (1.575 m), weight 160 lb (72.6 kg), SpO2 95 %.  Physical Exam  Constitutional: She is oriented to person, place, and time and well-developed, well-nourished, and in no distress.  HENT:  Mouth/Throat: No oropharyngeal exudate.  Dry mucus membranes   Eyes: No scleral icterus.  Cardiovascular: Normal rate, regular rhythm and normal heart sounds.  Pulmonary/Chest: Effort normal and breath sounds normal. No respiratory distress.  Abdominal: Soft. She exhibits distension. There is tenderness.  Neurological: She is alert and oriented to person, place, and time.  Skin: Skin is warm and dry.  Psychiatric: Affect normal.    Lab Results Lab Results  Component Value Date   WBC 9.3 09/17/2017   HGB 10.9 (L) 09/17/2017   HCT 33.9 (L) 09/17/2017   MCV 87.6 09/17/2017   PLT 209 09/17/2017    Lab Results  Component Value Date   CREATININE 1.20 (H) 09/17/2017   BUN 12 09/17/2017   NA 134 (L) 09/17/2017   K 4.3 09/17/2017   CL 101  09/17/2017   CO2 24 09/17/2017    Lab Results  Component Value Date   ALT 8 (L) 09/16/2017   AST 13 (L) 09/16/2017   ALKPHOS 65 09/16/2017   BILITOT 0.5 09/16/2017     Microbiology: No results found for this or any previous visit (from the past 240 hour(s)).

## 2017-09-17 NOTE — Consult Note (Signed)
Reason for Consult:SBO with possible foreign object in TI Referring Physician: Cathlean Cower [name] is an 83 y.o. female.  HPI: Patient is an 83 year old female who admitted with a partial small bowel obstruction on 05/31/17.  She underwent the routine small bowel protocol and contrast passed into her colon.  She has a history of Crohn's had not had a flare for many years.  She is placed on steroids and showed improvement.  She was subsequently discharged home on 06/03/17.  Patient returned to the ED yesterday with nausea and vomiting for the last 3-4 days.  She was unable to eat or drink because of being afraid of the vomiting.  She noted she had a prior obstruction.  She has been seen by Dr. Watt Climes and underwent a CT of the abdomen on 09/04/2017 which showed extensive mural thickening of the mucosa and hyperenhancement throughout the distal and terminal ileum compatible with Crohn's disease.  There is an unusual soft tissue nodularity throughout the omentum of uncertain etiology and significance but similar to what seen with intraperitoneal malignancy.  Previously noted intraluminal foreign body in the terminal ileum measuring 16 x 13 x 13 mm.  She was anticipating outpatient biopsy in 3 days for further diagnosis.  Workup in our emergency department shows: She is afebrile but tachycardic.  Blood pressure is stable.  Labs show a sodium of 131.  Glucose 139.  Creatinine 1.24.  Lipase is 17, LFTs are normal.  WBC is 12.3, hemoglobin 12.6, hematocrit 39, platelets 279,000.  Urinalysis negative.  Three-way chest and abdomen shows multiple dilated gas-filled loops of small bowel with air-fluid levels.  CT scan shows findings consistent with a high-grade mechanical small bowel obstruction transition point is noted in the right lower quadrant is related to a diseased segment of terminal ileum which demonstrates significant wall thickening and possible stricturing negative for free air.  There is a 17 mm  rectangular foreign object in the terminal ileum.  This was actually present on her last CT in July.  Noted to be nodular in the right upper quadrant.   Dr. Ree Kida has asked to see the patient in consultation.  She also anticipates GI evaluation.  Her primary question was about the foreign object noted in the terminal ileum.  Past Medical History:  Diagnosis Date  . Age-related macular degeneration, dry, both eyes   . Aortic insufficiency   . Aortic stenosis   . Arthritis    "hands" (05/31/2017)  . Asthma   . Basal cell carcinoma of nose   . Crohn's disease ([place])   . GERD (gastroesophageal reflux disease)   . History of duodenal ulcer   . History of stomach ulcers   . HOH (hard of hearing)   . Hx of rheumatic fever   . Hypertension   . Hypothyroidism   . Positive TB test   . Rheumatic heart disease   . Small bowel obstruction ([place]) 05/31/2017  . Squamous cell cancer of skin of nose   . TIA (transient ischemic attack)   . TIA (transient ischemic attack) 1999?    Past Surgical History:  Procedure Laterality Date  . BASAL CELL CARCINOMA EXCISION  X 2   "face"  . CARPAL TUNNEL RELEASE Right   . CATARACT EXTRACTION W/ INTRAOCULAR LENS IMPLANT Bilateral   . CHOLECYSTECTOMY    . SQUAMOUS CELL CARCINOMA EXCISION  X1   "face"  . TONSILLECTOMY      Family History  Problem Relation Age of Onset  .  Bladder Cancer Brother   . Breast cancer Daughter   . Crohn's disease Neg Hx     Social History:  reports that she quit smoking about 49 years ago. Her smoking use included cigarettes. She has a 1.50 pack-year smoking history. she has never used smokeless tobacco. She reports that she does not drink alcohol or use drugs.  Allergies: No Known Allergies  Medications:  Prior to Admission:  Medications Prior to Admission  Medication Sig Dispense Refill Last Dose  . carboxymethylcellulose (REFRESH PLUS) 0.5 % SOLN Place 1 drop into both eyes daily as needed (dry eyes).   05/30/2017 at  Unknown time  . clopidogrel (PLAVIX) 75 MG tablet Take 75 mg by mouth daily.  1 05/30/2017 at Unknown time  . folic acid (FOLVITE) 1 MG tablet Take 1 mg by mouth daily.   05/30/2017 at Unknown time  . levothyroxine (SYNTHROID, LEVOTHROID) 100 MCG tablet Take 100 mcg by mouth daily before breakfast.   05/30/2017 at Unknown time  . losartan (COZAAR) 50 MG tablet Take 50 mg by mouth daily.  1 05/30/2017 at Unknown time  . Multiple Vitamin (MULTIVITAMIN WITH MINERALS) TABS tablet Take 1 tablet by mouth daily.   05/30/2017 at Unknown time  . ondansetron (ZOFRAN) 4 MG tablet Take 1 tablet (4 mg total) by mouth every 6 (six) hours as needed for nausea. 20 tablet 0   . oxybutynin (DITROPAN) 5 MG tablet Take 5 mg by mouth daily.   05/30/2017 at Unknown time  . pantoprazole (PROTONIX) 40 MG tablet Take 40 mg by mouth every morning.  0 05/30/2017 at Unknown time  . predniSONE (DELTASONE) 20 MG tablet Take 3 tablets (60 mg total) by mouth daily before breakfast. 21 tablet 0   . predniSONE (DELTASONE) 50 MG tablet Take this course after 7/29 7 tablet 0    Continuous: . sodium chloride 125 mL/hr at 09/17/17 1254  . dextrose 5 % and 0.45 % NaCl with KCl 20 mEq/L     Anti-infectives (From admission, onward)   None      Results for orders placed or performed during the hospital encounter of 09/16/17 (from the past 48 hour(s))  Lipase, blood     Status: None   Collection Time: 09/16/17  3:07 PM  Result Value Ref Range   Lipase 17 11 - 51 U/L  Comprehensive metabolic panel     Status: Abnormal   Collection Time: 09/16/17  3:07 PM  Result Value Ref Range   Sodium 131 (L) 135 - 145 mmol/L   Potassium 4.4 3.5 - 5.1 mmol/L   Chloride 97 (L) 101 - 111 mmol/L   CO2 23 22 - 32 mmol/L   Glucose, Bld 139 (H) 65 - 99 mg/dL   BUN 11 6 - 20 mg/dL   Creatinine, Ser 1.24 (H) 0.44 - 1.00 mg/dL   Calcium 8.6 (L) 8.9 - 10.3 mg/dL   Total Protein 6.6 6.5 - 8.1 g/dL   Albumin 3.0 (L) 3.5 - 5.0 g/dL   AST 13 (L) 15 - 41  U/L   ALT 8 (L) 14 - 54 U/L   Alkaline Phosphatase 65 38 - 126 U/L   Total Bilirubin 0.5 0.3 - 1.2 mg/dL   GFR calc non Af Amer 39 (L) >60 mL/min   GFR calc Af Amer 45 (L) >60 mL/min    Comment: (NOTE) The eGFR has been calculated using the CKD EPI equation. This calculation has not been validated in all clinical situations. eGFR's persistently <60  mL/min signify possible Chronic Kidney Disease.    Anion gap 11 5 - 15  CBC     Status: Abnormal   Collection Time: 09/16/17  3:07 PM  Result Value Ref Range   WBC 12.3 (H) 4.0 - 10.5 K/uL   RBC 4.48 3.87 - 5.11 MIL/uL   Hemoglobin 12.6 12.0 - 15.0 g/dL   HCT 39.0 36.0 - 46.0 %   MCV 87.1 78.0 - 100.0 fL   MCH 28.1 26.0 - 34.0 pg   MCHC 32.3 30.0 - 36.0 g/dL   RDW 14.0 11.5 - 15.5 %   Platelets 279 150 - 400 K/uL  Urinalysis, Routine w reflex microscopic     Status: Abnormal   Collection Time: 09/17/17  4:25 AM  Result Value Ref Range   Color, Urine YELLOW YELLOW   APPearance CLEAR CLEAR   Specific Gravity, Urine 1.044 (H) 1.005 - 1.030   pH 5.0 5.0 - 8.0   Glucose, UA NEGATIVE NEGATIVE mg/dL   Hgb urine dipstick NEGATIVE NEGATIVE   Bilirubin Urine NEGATIVE NEGATIVE   Ketones, ur NEGATIVE NEGATIVE mg/dL   Protein, ur NEGATIVE NEGATIVE mg/dL   Nitrite NEGATIVE NEGATIVE   Leukocytes, UA NEGATIVE NEGATIVE  CBC     Status: Abnormal   Collection Time: 09/17/17  7:11 AM  Result Value Ref Range   WBC 9.3 4.0 - 10.5 K/uL   RBC 3.87 3.87 - 5.11 MIL/uL   Hemoglobin 10.9 (L) 12.0 - 15.0 g/dL   HCT 33.9 (L) 36.0 - 46.0 %   MCV 87.6 78.0 - 100.0 fL   MCH 28.2 26.0 - 34.0 pg   MCHC 32.2 30.0 - 36.0 g/dL   RDW 13.7 11.5 - 15.5 %   Platelets 209 150 - 400 K/uL  Basic metabolic panel     Status: Abnormal   Collection Time: 09/17/17  7:11 AM  Result Value Ref Range   Sodium 134 (L) 135 - 145 mmol/L   Potassium 4.3 3.5 - 5.1 mmol/L   Chloride 101 101 - 111 mmol/L   CO2 24 22 - 32 mmol/L   Glucose, Bld 130 (H) 65 - 99 mg/dL   BUN 12  6 - 20 mg/dL   Creatinine, Ser 1.20 (H) 0.44 - 1.00 mg/dL   Calcium 7.8 (L) 8.9 - 10.3 mg/dL   GFR calc non Af Amer 40 (L) >60 mL/min   GFR calc Af Amer 47 (L) >60 mL/min    Comment: (NOTE) The eGFR has been calculated using the CKD EPI equation. This calculation has not been validated in all clinical situations. eGFR's persistently <60 mL/min signify possible Chronic Kidney Disease.    Anion gap 9 5 - 15    Ct Abdomen Pelvis W Contrast  Result Date: 09/16/2017 CLINICAL DATA:  Bowel obstruction EXAM: CT ABDOMEN AND PELVIS WITH CONTRAST TECHNIQUE: Multidetector CT imaging of the abdomen and pelvis was performed using the standard protocol following bolus administration of intravenous contrast. CONTRAST:  140m ISOVUE-300 IOPAMIDOL (ISOVUE-300) INJECTION 61% COMPARISON:  09/16/2017, 09/04/2017, 05/31/2017 FINDINGS: Lower chest: Lung bases demonstrate no acute consolidation or pleural effusion. Heart size upper normal. Atherosclerotic calcifications. Hepatobiliary: Surgical absence of gallbladder. Mild intra and extrahepatic biliary enlargement similar compared to prior. Pancreas: Atrophic.  No inflammation Spleen: Normal in size without focal abnormality. Adrenals/Urinary Tract: Adrenal glands are within normal limits. Multifocal cortical scarring in the kidneys. No hydronephrosis. The bladder is within normal limits Stomach/Bowel: Esophageal tube is present, the tip terminates along the wall of the proximal stomach.  Multiple loops of markedly dilated, fluid-filled small bowel, measuring up to 6 cm in diameter. Transition point visualized in the right pelvis, series 3, image number 69 and is related to a disease segment of terminal ileum which appears very thickened. The colon is collapsed. Re- demonstrated rectangular 17 mm foreign object in the terminal ileum. Vascular/Lymphatic: Aortic atherosclerosis. No enlarged abdominal or pelvic lymph nodes. Reproductive: Uterus and bilateral adnexa are  unremarkable. Other: Negative for free air. Tiny amount of free fluid in the pelvis. Re- demonstrated mild nodularity of the omentum most notable in the right upper quadrant. Musculoskeletal: Degenerative changes of the spine. No acute or suspicious bone lesion. IMPRESSION: 1. Findings consistent with high-grade mechanical small bowel obstruction. Transition point visualized in the right lower quadrant/pelvis and is related to a diseased segment of terminal ileum which demonstrates significant wall thickening and possible stricturing. Negative for free air. 2. Re- demonstrated 17 mm rectangular foreign object in the terminal ileum. 3. Mild nodularity of the omentum re- demonstrated, correlation with tumor markers previously recommended. Electronically Signed   By: Donavan Foil M.D.   On: 09/16/2017 22:14   Dg Abdomen Acute W/chest  Result Date: 09/16/2017 CLINICAL DATA:  Initial evaluation for acute vomiting, abdominal pain. EXAM: DG ABDOMEN ACUTE W/ 1V CHEST COMPARISON:  Prior CT from 09/04/2017. FINDINGS: Cardiac and mediastinal silhouettes within normal limits. Lungs hypoinflated. Mild bibasilar atelectasis. No focal infiltrates. No pulmonary edema or pleural effusion. No pneumothorax. Multiple dilated gas-filled loops of small bowel seen within the upper mid and left abdomen. These measure up to 5.2 cm. Associated air-fluid levels. Findings consistent with small bowel obstruction. No soft tissue mass or abnormal calcification. No free air. IMPRESSION: 1. Multiple dilated gas-filled loops of small bowel with associated air-fluid levels, consistent with small bowel obstruction. 2. Shallow lung inflation with mild bibasilar atelectasis. Electronically Signed   By: Jeannine Boga M.D.   On: 09/16/2017 19:16    Review of Systems  Constitutional: Negative for chills, diaphoresis, fever, malaise/fatigue and weight loss.       Says she is felt bad since she went to see Dr. Lizbeth Bark on 09/04/17.  HENT:  Negative.   Eyes: Negative.   Respiratory: Negative.   Cardiovascular: Negative.   Gastrointestinal: Positive for abdominal pain (She feels distended but not really having any pain.), nausea and vomiting. Negative for blood in stool, constipation, diarrhea, heartburn and melena.  Genitourinary: Negative.   Musculoskeletal: Negative.   Skin: Negative.   Neurological: Negative.  Negative for weakness.  Endo/Heme/Allergies: Negative.   Psychiatric/Behavioral: Negative.    Blood pressure 129/61, pulse 81, temperature 98.4 F (36.9 C), temperature source Oral, resp. rate 14, height 5' 2" (1.575 m), weight 72.6 kg (160 lb), SpO2 95 %. Physical Exam  Constitutional: She is oriented to person, place, and time. She appears well-developed and well-nourished. No distress.  HENT:  Head: Normocephalic and atraumatic.  Mouth/Throat: No oropharyngeal exudate.  Eyes: Right eye exhibits no discharge. Left eye exhibits no discharge. No scleral icterus.  Pupils are equal  Neck: Normal range of motion. Neck supple. No JVD present. No tracheal deviation present. No thyromegaly present.  Cardiovascular: Normal rate, regular rhythm, normal heart sounds and intact distal pulses.  No murmur heard. Respiratory: No respiratory distress. She has no wheezes. She has no rales. She exhibits no tenderness.  GI: Soft. She exhibits distension. She exhibits no mass. There is tenderness (Minimal just from the distention.). There is no rebound and no guarding.  Some hyperactive  bowel sounds.  Musculoskeletal: She exhibits no edema or tenderness.  Lymphadenopathy:    She has no cervical adenopathy.  Neurological: She is alert and oriented to person, place, and time. No cranial nerve deficit.  Skin: Skin is warm and dry. No rash noted. She is not diaphoretic. No erythema. No pallor.  Psychiatric: She has a normal mood and affect. Her behavior is normal. Judgment and thought content normal.   . sodium chloride 125 mL/hr  at 09/17/17 1254  . dextrose 5 % and 0.45 % NaCl with KCl 20 mEq/L     Anti-infectives (From admission, onward)   None     Assessment/Plan: Recurrent small bowel obstruction Crohn's disease with inflammation of the distal terminal ileum/ foreign object distal terminal ileum. Abnormal omental pattern on CT/biopsy pending. History of GERD/duodenal ulcers Reported mild aortic stenosis. Hypertension Hypothyroid Remote history of rheumatic heart disease.   History of tobacco use History of TIA.  Fen: N.p.o./IV fluids ID: None DVT: SCD/Lovenox   Plan: We will review CT with Dr. Marlou Starks.  Patient is currently on steroids. GI is seeing also and getting her set up for biopsy previously scheduled.  We will follow with you.  NG working about 1/2 Management consultant in there now.      Terri [name] 09/17/2017, 12:36 PM

## 2017-09-17 NOTE — Progress Notes (Signed)
Asked Dr Alcario Drought If we'll iniitiate SBO protocol and give gastrografin tonight, he said that we will wait for GI and surgery consult first.

## 2017-09-17 NOTE — Progress Notes (Signed)
IR will plan for [facility] trip on Wednesday to [facility] for already scheduled omental biopsy.  Due to scheduling here at [facility], we will be unable to do this in a timely fashion so will arrange for her to keep her original appointment.  [facility] will need to have her at [place] by 0800am.  Fahad Cisse E 3:35 PM 09/17/2017

## 2017-09-17 NOTE — Consult Note (Signed)
Referring Provider:  Dr. Ree Kida Primary Care Physician:  Prince Solian, MD Primary Gastroenterologist:  Dr. Watt Climes  Reason for Consultation:  Crohn's ileitis with bowel obstruction  HPI: Terri [name] is a 83 y.o. female diagnosed with Crohn's ileitis about 5 years ago and who more recently has moved to this area and is being followed by Dr. Watt Climes in our office.  No history of intestinal resection.  Has been treated primarily with Pentasa as well as periodic steroid boluses. 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  Was hospitalized here several months ago with bowel obstruction which resolved with conservative therapy and steroid bolus. Underwent updated CT recently which showed significant ileitis and was maintained on prednisone as an outpatient in a gradually tapering dose.  Over the past several weeks, basically since the time of her recent CT scan, the patient has had low-grade obstructive symptoms with decreased appetite, food intolerance especially to solid food as opposed liquids, and then the onset of bilious vomiting yesterday, prompting admission through the emergency room, where CT showed frank small bowel obstruction.  There is a metallic cylindrical object in the terminal ileum which does not appear to be the cause of her obstruction; the patient denies any history of a video capsule endoscopy test.  Since coming into the hospital, the patient has been on NG suction with partial relief of symptoms. Her NG output, however, has been modest.  The patient's to most recent CT scans have shown some omental nodularity raising the question of intra-abdominal malignancy and omental "caking."  Outpatient tumor markers were significantly elevated, especially CA-19-9 which was 681 (normal is up to only 35), CEA of 22 (normal up to 5), Ca-125 of 39 (normal up to 38)       Past  Medical History:  Diagnosis Date  . Age-related macular degeneration, dry, both eyes   . Aortic insufficiency   . Aortic stenosis   . Arthritis    "hands" (05/31/2017)  . Asthma   . Basal cell carcinoma of nose   . Crohn's disease ([place])   . GERD (gastroesophageal reflux disease)   . History of duodenal ulcer   . History of stomach ulcers   . HOH (hard of hearing)   . Hx of rheumatic fever   . Hypertension   . Hypothyroidism   . Positive TB test   . Rheumatic heart disease   . Small bowel obstruction ([place]) 05/31/2017  . Squamous cell cancer of skin of nose   . TIA (transient ischemic attack)   . TIA (transient ischemic attack) 1999?    Past Surgical History:  Procedure Laterality Date  . BASAL CELL CARCINOMA EXCISION  X 2   "face"  . CARPAL TUNNEL RELEASE Right   . CATARACT EXTRACTION W/ INTRAOCULAR LENS IMPLANT Bilateral   . CHOLECYSTECTOMY    . SQUAMOUS CELL CARCINOMA EXCISION  X1   "face"  . TONSILLECTOMY      Prior to Admission medications   Medication Sig Start Date End Date Taking? Authorizing Provider  carboxymethylcellulose (REFRESH PLUS) 0.5 % SOLN Place 1 drop into both eyes daily as needed (dry eyes).    [provider]  clopidogrel (PLAVIX) 75 MG tablet Take 75 mg by mouth daily. 04/26/17   [provider]  folic acid (FOLVITE) 1 MG tablet Take 1 mg by mouth daily.    [provider]  levothyroxine (SYNTHROID, LEVOTHROID) 100 MCG tablet Take  100 mcg by mouth daily before breakfast.    [provider]  losartan (COZAAR) 50 MG tablet Take 50 mg by mouth daily. 04/26/17   [provider]  Multiple Vitamin (MULTIVITAMIN WITH MINERALS) TABS tablet Take 1 tablet by mouth daily.    [provider]  ondansetron (ZOFRAN) 4 MG tablet Take 1 tablet (4 mg total) by mouth every 6 (six) hours as needed for nausea. 06/03/17   Nita Sells, MD  oxybutynin (DITROPAN) 5 MG tablet Take 5 mg by mouth daily.    [provider]  pantoprazole (PROTONIX) 40 MG tablet Take 40 mg by mouth every morning. 05/15/17   [provider]  predniSONE (DELTASONE) 20 MG tablet Take 3 tablets (60 mg total) by mouth daily before breakfast. 06/03/17   Nita Sells, MD  predniSONE (DELTASONE) 50 MG tablet Take this course after 7/29 06/03/17   Nita Sells, MD    Current Facility-Administered Medications  Medication Dose Route Frequency Provider Last Rate Last Dose  . 0.9 %  sodium chloride infusion   Intravenous Continuous Etta Quill, DO 125 mL/hr at 09/16/17 2332    . acetaminophen (TYLENOL) tablet 650 mg  650 mg Oral Q6H PRN Etta Quill, DO       Or  . acetaminophen (TYLENOL) suppository 650 mg  650 mg Rectal Q6H PRN Etta Quill, DO      . dextrose 5 % and 0.45 % NaCl with KCl 20 mEq/L infusion   Intravenous Once Dorie Rank, MD      . enoxaparin (LOVENOX) injection 40 mg  40 mg Subcutaneous Q24H Jennette Kettle M, DO   40 mg at 09/16/17 2328  . folic acid (FOLVITE) tablet 1 mg  1 mg Oral Daily Jennette Kettle M, DO   1 mg at 09/17/17 6195  . hydroxypropyl methylcellulose / hypromellose (ISOPTO TEARS / GONIOVISC) 2.5 % ophthalmic solution 1 drop  1 drop Both Eyes Daily PRN Etta Quill, DO      . iopamidol (ISOVUE-M) 61 % intrathecal injection 15 mL  15 mL Intrathecal Once PRN Etta Quill, DO      . levothyroxine (SYNTHROID, LEVOTHROID) tablet 100 mcg  100 mcg Oral QAC breakfast Etta Quill, DO   100 mcg at 09/17/17 0932  . losartan (COZAAR) tablet 50 mg  50 mg Oral Daily Jennette Kettle M, DO   50 mg at 09/17/17 6712  . MEDLINE mouth rinse  15 mL Mouth Rinse BID Etta Quill, DO   15 mL at 09/17/17 4580  . methylPREDNISolone sodium succinate (SOLU-MEDROL) 125 mg/2 mL injection 60 mg  60 mg Intravenous Daily Jennette Kettle M, DO   60 mg at 09/17/17 9983  . morphine 4 MG/ML injection 2-4 mg  2-4 mg Intravenous Q4H PRN Etta Quill, DO      . ondansetron [facility])  tablet 4 mg  4 mg Oral Q6H PRN Etta Quill, DO       Or  . ondansetron [facility]) injection 4 mg  4 mg Intravenous Q6H PRN Etta Quill, DO      . oxybutynin (DITROPAN) tablet 5 mg  5 mg Oral Daily Jennette Kettle M, DO   5 mg at 09/17/17 3825  . pantoprazole (PROTONIX) EC tablet 40 mg  40 mg Oral Daily Etta Quill, DO   40 mg at 09/17/17 0539    Allergies as of 09/16/2017  . (No Known Allergies)    Family History  Problem  Relation Age of Onset  . Bladder Cancer Brother   . Breast cancer Daughter   . Crohn's disease Neg Hx     Social History   Socioeconomic History  . Marital status: Widowed    Spouse name: Not on file  . Number of children: Not on file  . Years of education: Not on file  . Highest education level: Not on file  Social Needs  . Financial resource strain: Not on file  . Food insecurity - worry: Not on file  . Food insecurity - inability: Not on file  . Transportation needs - medical: Not on file  . Transportation needs - non-medical: Not on file  Occupational History  . Not on file  Tobacco Use  . Smoking status: Former Smoker    Packs/day: 0.10    Years: 15.00    Pack years: 1.50    Types: Cigarettes    Last attempt to quit: 1969    Years since quitting: 49.8  . Smokeless tobacco: Never Used  Substance and Sexual Activity  . Alcohol use: No  . Drug use: No  . Sexual activity: Not Currently  Other Topics Concern  . Not on file  Social History Narrative   Widowed 12/2016. Living with son. Retired Therapist, sports     Review of Systems: The patient is physically quite active, doing water exercises on a frequent basis. No chest pain, no dyspnea (despite history of "rheumatic heart," with apparently an unremarkable ultrasound recently), no extraintestinal manifestations of Crohn's such as ocular discomfort, unusual skin rashes, mouth sores, or joint swelling. No urinary symptoms. No evident lymphadenopathy. No mental health issues such as anxiety or  depression   Physical Exam: Vital signs in last 24 hours: Temp:  [98.2 F (36.8 C)-98.8 F (37.1 C)] 98.4 F (36.9 C) (11/05 0449) Pulse Rate:  [81-122] 81 (11/05 0449) Resp:  [14-24] 14 (11/05 0449) BP: (109-129)/(61-92) 129/61 (11/05 0449) SpO2:  [90 %-97 %] 95 % (11/05 0449) Weight:  [72.6 kg (160 lb)] 72.6 kg (160 lb) (11/04 2236) Last BM Date: 09/15/17 General:   Alert,  Well-developed, well-nourished, pleasant and cooperative in NAD, NG tube in place  Head:  Normocephalic and atraumatic. Eyes:  Sclera clear, no icterus.   Mouth:   No ulcerations or lesions.  Oropharynx pink & moist. Neck:   No masses or thyromegaly. Lungs:  Clear throughout to auscultation.   No wheezes, crackles, or rhonchi. No evident respiratory distress. Heart:   Regular rate and rhythm with occasional premature beats; no clicks, rubs,  or gallops. possible soft systolic murmur.  Abdomen: Slightly firm in the epigastric area, without overt distention. No masses, hepatosplenomegaly or ventral hernias noted. Quiet bowel sounds, no obstructive "rushes" present  Msk:   Symmetrical without gross deformities. Extremities:   Without clubbing, cyanosis, or edema. Neurologic:  Alert and coherent;  grossly normal neurologically. Skin:  Intact without significant lesions or rashes. Cervical Nodes:  No significant cervical adenopathy. Psych:   Alert and cooperative. Normal mood and affect.  Intake/Output from previous day: 11/04 0701 - 11/05 0700 In: 1183.3 [I.V.:683.3; IV Piggyback:500] Out: 623 [Urine:325; Emesis/NG output:450] Intake/Output this shift: Total I/O In: 0  Out: 500 [Urine:500]  Lab Results: Recent Labs    09/16/17 1507 09/17/17 0711  WBC 12.3* 9.3  HGB 12.6 10.9*  HCT 39.0 33.9*  PLT 279 209   BMET Recent Labs    09/16/17 1507 09/17/17 0711  NA 131* 134*  K 4.4 4.3  CL 97* 101  CO2 23 24  GLUCOSE 139* 130*  BUN 11 12  CREATININE 1.24* 1.20*  CALCIUM 8.6* 7.8*   LFT Recent  Labs    09/16/17 1507  PROT 6.6  ALBUMIN 3.0*  AST 13*  ALT 8*  ALKPHOS 65  BILITOT 0.5   PT/INR No results for input(s): LABPROT, INR in the last 72 hours.  Studies/Results: Ct Abdomen Pelvis W Contrast  Result Date: 09/16/2017 CLINICAL DATA:  Bowel obstruction EXAM: CT ABDOMEN AND PELVIS WITH CONTRAST TECHNIQUE: Multidetector CT imaging of the abdomen and pelvis was performed using the standard protocol following bolus administration of intravenous contrast. CONTRAST:  13mL ISOVUE-300 IOPAMIDOL (ISOVUE-300) INJECTION 61% COMPARISON:  09/16/2017, 09/04/2017, 05/31/2017 FINDINGS: Lower chest: Lung bases demonstrate no acute consolidation or pleural effusion. Heart size upper normal. Atherosclerotic calcifications. Hepatobiliary: Surgical absence of gallbladder. Mild intra and extrahepatic biliary enlargement similar compared to prior. Pancreas: Atrophic.  No inflammation Spleen: Normal in size without focal abnormality. Adrenals/Urinary Tract: Adrenal glands are within normal limits. Multifocal cortical scarring in the kidneys. No hydronephrosis. The bladder is within normal limits Stomach/Bowel: Esophageal tube is present, the tip terminates along the wall of the proximal stomach. Multiple loops of markedly dilated, fluid-filled small bowel, measuring up to 6 cm in diameter. Transition point visualized in the right pelvis, series 3, image number 69 and is related to a disease segment of terminal ileum which appears very thickened. The colon is collapsed. Re- demonstrated rectangular 17 mm foreign object in the terminal ileum. Vascular/Lymphatic: Aortic atherosclerosis. No enlarged abdominal or pelvic lymph nodes. Reproductive: Uterus and bilateral adnexa are unremarkable. Other: Negative for free air. Tiny amount of free fluid in the pelvis. Re- demonstrated mild nodularity of the omentum most notable in the right upper quadrant. Musculoskeletal: Degenerative changes of the spine. No acute or  suspicious bone lesion. IMPRESSION: 1. Findings consistent with high-grade mechanical small bowel obstruction. Transition point visualized in the right lower quadrant/pelvis and is related to a diseased segment of terminal ileum which demonstrates significant wall thickening and possible stricturing. Negative for free air. 2. Re- demonstrated 17 mm rectangular foreign object in the terminal ileum. 3. Mild nodularity of the omentum re- demonstrated, correlation with tumor markers previously recommended. Electronically Signed   By: Donavan Foil M.D.   On: 09/16/2017 22:14   Dg Abdomen Acute W/chest  Result Date: 09/16/2017 CLINICAL DATA:  Initial evaluation for acute vomiting, abdominal pain. EXAM: DG ABDOMEN ACUTE W/ 1V CHEST COMPARISON:  Prior CT from 09/04/2017. FINDINGS: Cardiac and mediastinal silhouettes within normal limits. Lungs hypoinflated. Mild bibasilar atelectasis. No focal infiltrates. No pulmonary edema or pleural effusion. No pneumothorax. Multiple dilated gas-filled loops of small bowel seen within the upper mid and left abdomen. These measure up to 5.2 cm. Associated air-fluid levels. Findings consistent with small bowel obstruction. No soft tissue mass or abnormal calcification. No free air. IMPRESSION: 1. Multiple dilated gas-filled loops of small bowel with associated air-fluid levels, consistent with small bowel obstruction. 2. Shallow lung inflation with mild bibasilar atelectasis. Electronically Signed   By: Jeannine Boga M.D.   On: 09/16/2017 19:16    Impression: 1. Late onset Crohn's ileitis, with bowel wall thickening 2. Recent onset small bowel obstruction, recurrent from last July 3. CT scan raising question of omental nodularity raising the question of possible intra-abdominal malignancy with minimal elevation of tumor markers as outpatient   Plan: 1. IR consult for percutaneous biops of omental nodularity y , as had already been planned as an outpatient for  Wednesday of this week  2. ID consult to see if the patient is a candidate for biologic therapy such as Remicade in view of her apparent prior positive TB test 3. Agree with general surgical consult 4. A further course of action in this somewhat complicated patient, particularly whether she should have surgical as opposed to medical management of her small bowel disease with narrowing, will depend on the above results   LOS: 1 day   Terri [name] V  09/17/2017, 12:43 PM   Pager [phone] If no answer or after 5 PM call [phone]

## 2017-09-18 ENCOUNTER — Inpatient Hospital Stay (HOSPITAL_COMMUNITY): Payer: Medicare Other

## 2017-09-18 LAB — CBC
HCT: 35.4 % — ABNORMAL LOW (ref 36.0–46.0)
Hemoglobin: 11.2 g/dL — ABNORMAL LOW (ref 12.0–15.0)
MCH: 28.1 pg (ref 26.0–34.0)
MCHC: 31.6 g/dL (ref 30.0–36.0)
MCV: 88.9 fL (ref 78.0–100.0)
Platelets: 254 10*3/uL (ref 150–400)
RBC: 3.98 MIL/uL (ref 3.87–5.11)
RDW: 14.2 % (ref 11.5–15.5)
WBC: 12 10*3/uL — ABNORMAL HIGH (ref 4.0–10.5)

## 2017-09-18 LAB — BASIC METABOLIC PANEL
Anion gap: 9 (ref 5–15)
BUN: 16 mg/dL (ref 6–20)
CO2: 22 mmol/L (ref 22–32)
Calcium: 7.7 mg/dL — ABNORMAL LOW (ref 8.9–10.3)
Chloride: 106 mmol/L (ref 101–111)
Creatinine, Ser: 1.2 mg/dL — ABNORMAL HIGH (ref 0.44–1.00)
GFR calc Af Amer: 47 mL/min — ABNORMAL LOW (ref >60)
GFR calc non Af Amer: 40 mL/min — ABNORMAL LOW (ref >60)
Glucose, Bld: 90 mg/dL (ref 65–99)
Potassium: 4.2 mmol/L (ref 3.5–5.1)
Sodium: 137 mmol/L (ref 135–145)

## 2017-09-18 LAB — PROTIME-INR
INR: 1.19
Prothrombin Time: 15 seconds (ref 11.4–15.2)

## 2017-09-18 MED ORDER — PANTOPRAZOLE SODIUM 40 MG IV SOLR
40.0000 mg | INTRAVENOUS | Status: DC
Start: 2017-09-18 — End: 2017-09-28
  Administered 2017-09-18 – 2017-09-27 (×9): 40 mg via INTRAVENOUS
  Filled 2017-09-18 (×10): qty 40

## 2017-09-18 MED ORDER — LEVOTHYROXINE SODIUM 100 MCG IV SOLR
50.0000 ug | Freq: Every day | INTRAVENOUS | Status: DC
  Administered 2017-09-18 – 2017-09-27 (×9): 50 ug via INTRAVENOUS
  Filled 2017-09-18 (×9): qty 5

## 2017-09-18 MED ORDER — METOPROLOL TARTRATE 5 MG/5ML IV SOLN
2.5000 mg | Freq: Four times a day (QID) | INTRAVENOUS | Status: DC
  Administered 2017-09-18 – 2017-09-28 (×34): 2.5 mg via INTRAVENOUS
  Filled 2017-09-18 (×39): qty 5

## 2017-09-18 NOTE — Progress Notes (Signed)
    CC:  SBO  Subjective: No real change This AM.  NG suction was turned so she is not draining  through her NG tube.  Recommend medication be given IV instead p.o.  Objective: Vital signs in last 24 hours: Temp:  [98.4 F (36.9 C)-98.5 F (36.9 C)] 98.5 F (36.9 C) (11/06 0455) Pulse Rate:  [75-86] 86 (11/06 0455) Resp:  [16-18] 18 (11/06 0455) BP: (109-119)/(43-85) 112/43 (11/06 0455) SpO2:  [92 %-93 %] 92 % (11/06 0455) Last BM Date: 09/15/17 NPO  3041 IV 900 urine 100 per NG Afebrile, VSS INR 1.19 NO other labs this AM    Intake/Output from previous day: 11/05 0701 - 11/06 0700 In: 3041.7 [I.V.:3041.7] Out: 1000 [Urine:900; Emesis/NG output:100] Intake/Output this shift: No intake/output data recorded.  General appearance: alert, cooperative and no distress GI: Still distended and uncomfortable. BS hypoactive.    Lab Results:  Recent Labs    09/16/17 1507 09/17/17 0711  WBC 12.3* 9.3  HGB 12.6 10.9*  HCT 39.0 33.9*  PLT 279 209    BMET Recent Labs    09/16/17 1507 09/17/17 0711  NA 131* 134*  K 4.4 4.3  CL 97* 101  CO2 23 24  GLUCOSE 139* 130*  BUN 11 12  CREATININE 1.24* 1.20*  CALCIUM 8.6* 7.8*   PT/INR Recent Labs    09/18/17 0558  LABPROT 15.0  INR 1.19    Recent Labs  Lab 09/16/17 1507  AST 13*  ALT 8*  ALKPHOS 65  BILITOT 0.5  PROT 6.6  ALBUMIN 3.0*     Lipase     Component Value Date/Time   LIPASE 17 09/16/2017 1507     Medications: . [START ON 09/19/2017] enoxaparin (LOVENOX) injection  40 mg Subcutaneous Q24H  . folic acid  1 mg Oral Daily  . levothyroxine  100 mcg Oral QAC breakfast  . losartan  50 mg Oral Daily  . mouth rinse  15 mL Mouth Rinse BID  . methylPREDNISolone (SOLU-MEDROL) injection  60 mg Intravenous Daily  . oxybutynin  5 mg Oral Daily  . pantoprazole  40 mg Oral Daily   . sodium chloride 125 mL/hr at 09/18/17 0455  . dextrose 5 % and 0.45 % NaCl with KCl 20 mEq/L       Assessment/Plan Recurrent small bowel obstruction Crohn's disease with inflammation of the distal terminal ileum/ foreign object distal terminal ileum. Medical management currently - Methylprednisolone Abnormal omental pattern on CT/biopsy pending. BX @[facility]  09/19/17 History of GERD/duodenal ulcers - Protonix Mild Renal insuffiencey - creatinine 1.24  Reported mild aortic stenosis. Hypertension Hypothyroid - synthroid Remote history of rheumatic heart disease.   History of tobacco use History of TIA.  Fen: N.p.o./IV fluids ID: None DVT: SCD/Lovenox  Plan: Currently going with medical management.  She is on IV Solu-Medrol.  Biopsy is scheduled for tomorrow of her omentum. Will recheck plain film today.         LOS: 2 days    Crissy Mccreadie 09/18/2017 [phone]

## 2017-09-18 NOTE — Progress Notes (Signed)
PROGRESS NOTE    Terri [name]  DXA:[id number] DOB: 11-27-1930 DOA: 09/16/2017 PCP: Prince Solian, MD   Chief Complaint  Patient presents with  . Abdominal Pain  . Nausea  . Emesis    Brief Narrative:  HPI on 09/16/2017 by Dr. Jolene Schimke Vensel is a 83 y.o. female with medical history significant of Crohn's disease diagnosed x5 years ago.  SBO in July this year felt to be secondary to Crohn's disease.  SBO resolved / improved after NGT and steroids though she has been having some abdominal symptoms in the intervening time.  Patient presents to the ED with c/o nausea, abd distention, concern for another SBO.  Of note patient just had CT scan on 10/23 which showed findings suggestive of Crohns, omental nodularity worrisome for malignancy (patient had biopsy scheduled at [facility] on Wed this week), and foreign body in terminal ilium, possibly a pill or tablet that has been present since CT scan in July of this year.  Assessment & Plan   Abdominal pain secondary to SBO -CT abdomen: high-grade mechanical SBO. Transition point in RLQ/pelvis and is related to a diseased segment of terminal ileum which demonstrates wall thickening and possible stricturing -Currently NPO with NG tube in place -General surgery and gastroenterology consulted and appreciated -patient was scheduled for liver biopsy on 09/18/2017 -continue conservative management with IVF, antiemetics, pain control -abdominal Xray today: persistent mid to distal SBO with gaseous distention of small bowel loops, no evidence of perforation  Crohns Disease -Gastroenterology consulted and appreciated -ID consulted by GI for possible use of Remicade as patient had prior +TB test- ID felt no other therapy indicated prior to consideration of biologic agents. Patient completed 6 months of INH -Continue steroids   Foreign body in the small intestine -noted on CT abd/pelvis: 57mm foreign object in the terminal  ileum -general surgery consulted and appreciated  Omental nodularity  -patient was scheduled for liver or some type of biopsy as an outpatient -Interventional radiology consulted and appreciated- plan for biopsy biopsy on 09/19/2017 at [facility]  Hypothyroidism -Will change synthroid to IV form  DVT Prophylaxis  lovenox  Code Status: Full  Family Communication: None at bedside  Disposition Plan: Admitted, pending further recommendations   Consultants General surgery Gastroenterology Interventional radiology  Procedures  None  Antibiotics   Anti-infectives (From admission, onward)   None      Subjective:   Terri [name] seen and examined today.  Feels worried and does not want surgery. Denies nausea or vomiting. Denies chest pain, shortness of breath, dizziness, headache.    Objective:   Vitals:   09/17/17 1603 09/17/17 2126 09/18/17 0455 09/18/17 0830  BP: 109/85 119/60 (!) 112/43 129/77  Pulse: 84 75 86 72  Resp: 16 16 18    Temp: 98.4 F (36.9 C) 98.4 F (36.9 C) 98.5 F (36.9 C)   TempSrc: Oral Oral Oral   SpO2: 93% 93% 92%   Weight:      Height:        Intake/Output Summary (Last 24 hours) at 09/18/2017 1225 Last data filed at 09/18/2017 1023 Gross per 24 hour  Intake 3041.67 ml  Output 501 ml  Net 2540.67 ml   Filed Weights   09/16/17 1500 09/16/17 2236  Weight: 72.6 kg (160 lb) 72.6 kg (160 lb)   Exam  General: Well developed, well nourished, NAD, appears stated age  HEENT: NCAT, mucous membranes moist. NG tube in place  Cardiovascular: S1 S2 auscultated, Soft SEM, RRR  Respiratory: Clear to auscultation bilaterally with equal chest rise  Abdomen: Mildly distended and TTP, hypoactive bowel sounds  Extremities: warm dry without cyanosis clubbing or edema  Neuro: AAOx3, nonfocal  Psych: Anxious however appropriate  Data Reviewed: I have personally reviewed following labs and imaging studies  CBC: Recent Labs  Lab  09/16/17 1507 09/17/17 0711 09/18/17 0849  WBC 12.3* 9.3 12.0*  HGB 12.6 10.9* 11.2*  HCT 39.0 33.9* 35.4*  MCV 87.1 87.6 88.9  PLT 279 209 035   Basic Metabolic Panel: Recent Labs  Lab 09/16/17 1507 09/17/17 0711 09/18/17 0849  NA 131* 134* 137  K 4.4 4.3 4.2  CL 97* 101 106  CO2 23 24 22   GLUCOSE 139* 130* 90  BUN 11 12 16   CREATININE 1.24* 1.20* 1.20*  CALCIUM 8.6* 7.8* 7.7*   GFR: Estimated Creatinine Clearance: 32.6 mL/min (A) (by C-G formula based on SCr of 1.2 mg/dL (H)). Liver Function Tests: Recent Labs  Lab 09/16/17 1507  AST 13*  ALT 8*  ALKPHOS 65  BILITOT 0.5  PROT 6.6  ALBUMIN 3.0*   Recent Labs  Lab 09/16/17 1507  LIPASE 17   No results for input(s): AMMONIA in the last 168 hours. Coagulation Profile: Recent Labs  Lab 09/18/17 0558  INR 1.19   Cardiac Enzymes: No results for input(s): CKTOTAL, CKMB, CKMBINDEX, TROPONINI in the last 168 hours. BNP (last 3 results) No results for input(s): PROBNP in the last 8760 hours. HbA1C: No results for input(s): HGBA1C in the last 72 hours. CBG: No results for input(s): GLUCAP in the last 168 hours. Lipid Profile: No results for input(s): CHOL, HDL, LDLCALC, TRIG, CHOLHDL, LDLDIRECT in the last 72 hours. Thyroid Function Tests: No results for input(s): TSH, T4TOTAL, FREET4, T3FREE, THYROIDAB in the last 72 hours. Anemia Panel: No results for input(s): VITAMINB12, FOLATE, FERRITIN, TIBC, IRON, RETICCTPCT in the last 72 hours. Urine analysis:    Component Value Date/Time   COLORURINE YELLOW 09/17/2017 0425   APPEARANCEUR CLEAR 09/17/2017 0425   LABSPEC 1.044 (H) 09/17/2017 0425   PHURINE 5.0 09/17/2017 0425   GLUCOSEU NEGATIVE 09/17/2017 0425   HGBUR NEGATIVE 09/17/2017 0425   BILIRUBINUR NEGATIVE 09/17/2017 0425   KETONESUR NEGATIVE 09/17/2017 0425   PROTEINUR NEGATIVE 09/17/2017 0425   NITRITE NEGATIVE 09/17/2017 0425   LEUKOCYTESUR NEGATIVE 09/17/2017 0425   Sepsis  Labs: @LABRCNTIP (procalcitonin:4,lacticidven:4)  )No results found for this or any previous visit (from the past 240 hour(s)).    Radiology Studies: Ct Abdomen Pelvis W Contrast  Result Date: 09/16/2017 CLINICAL DATA:  Bowel obstruction EXAM: CT ABDOMEN AND PELVIS WITH CONTRAST TECHNIQUE: Multidetector CT imaging of the abdomen and pelvis was performed using the standard protocol following bolus administration of intravenous contrast. CONTRAST:  18mL ISOVUE-300 IOPAMIDOL (ISOVUE-300) INJECTION 61% COMPARISON:  09/16/2017, 09/04/2017, 05/31/2017 FINDINGS: Lower chest: Lung bases demonstrate no acute consolidation or pleural effusion. Heart size upper normal. Atherosclerotic calcifications. Hepatobiliary: Surgical absence of gallbladder. Mild intra and extrahepatic biliary enlargement similar compared to prior. Pancreas: Atrophic.  No inflammation Spleen: Normal in size without focal abnormality. Adrenals/Urinary Tract: Adrenal glands are within normal limits. Multifocal cortical scarring in the kidneys. No hydronephrosis. The bladder is within normal limits Stomach/Bowel: Esophageal tube is present, the tip terminates along the wall of the proximal stomach. Multiple loops of markedly dilated, fluid-filled small bowel, measuring up to 6 cm in diameter. Transition point visualized in the right pelvis, series 3, image number 69 and is related to a disease segment of terminal ileum  which appears very thickened. The colon is collapsed. Re- demonstrated rectangular 17 mm foreign object in the terminal ileum. Vascular/Lymphatic: Aortic atherosclerosis. No enlarged abdominal or pelvic lymph nodes. Reproductive: Uterus and bilateral adnexa are unremarkable. Other: Negative for free air. Tiny amount of free fluid in the pelvis. Re- demonstrated mild nodularity of the omentum most notable in the right upper quadrant. Musculoskeletal: Degenerative changes of the spine. No acute or suspicious bone lesion. IMPRESSION: 1.  Findings consistent with high-grade mechanical small bowel obstruction. Transition point visualized in the right lower quadrant/pelvis and is related to a diseased segment of terminal ileum which demonstrates significant wall thickening and possible stricturing. Negative for free air. 2. Re- demonstrated 17 mm rectangular foreign object in the terminal ileum. 3. Mild nodularity of the omentum re- demonstrated, correlation with tumor markers previously recommended. Electronically Signed   By: Donavan Foil M.D.   On: 09/16/2017 22:14   Dg Abdomen Acute W/chest  Result Date: 09/16/2017 CLINICAL DATA:  Initial evaluation for acute vomiting, abdominal pain. EXAM: DG ABDOMEN ACUTE W/ 1V CHEST COMPARISON:  Prior CT from 09/04/2017. FINDINGS: Cardiac and mediastinal silhouettes within normal limits. Lungs hypoinflated. Mild bibasilar atelectasis. No focal infiltrates. No pulmonary edema or pleural effusion. No pneumothorax. Multiple dilated gas-filled loops of small bowel seen within the upper mid and left abdomen. These measure up to 5.2 cm. Associated air-fluid levels. Findings consistent with small bowel obstruction. No soft tissue mass or abnormal calcification. No free air. IMPRESSION: 1. Multiple dilated gas-filled loops of small bowel with associated air-fluid levels, consistent with small bowel obstruction. 2. Shallow lung inflation with mild bibasilar atelectasis. Electronically Signed   By: Jeannine Boga M.D.   On: 09/16/2017 19:16   Dg Abd Portable 1v  Result Date: 09/18/2017 CLINICAL DATA:  Abdominal pain, small bowel obstruction. EXAM: PORTABLE ABDOMEN - 1 VIEW COMPARISON:  Abdominal CT scan of September 16, 2017 and abdominal series of the same day. FINDINGS: There remain loops of moderately distended gas-filled small bowel in the midline. The uric free of gaseous distention of portions of the jejunum reaches 8.5 cm. No significant gas or stool is observed in the colon. The stomach is  nondistended. The esophagogastric tube tip in proximal port project in the gastric cardia. There is contrast material within the urinary bladder. IMPRESSION: Persistent mid to distal small bowel obstruction with significant gaseous distention of small-bowel loops. No evidence of perforation currently. Electronically Signed   By: David  Martinique M.D.   On: 09/18/2017 10:44     Scheduled Meds: . [START ON 09/19/2017] enoxaparin (LOVENOX) injection  40 mg Subcutaneous Q24H  . folic acid  1 mg Oral Daily  . levothyroxine  50 mcg Intravenous Daily  . mouth rinse  15 mL Mouth Rinse BID  . methylPREDNISolone (SOLU-MEDROL) injection  60 mg Intravenous Daily  . metoprolol tartrate  2.5 mg Intravenous Q6H  . pantoprazole (PROTONIX) IV  40 mg Intravenous Q24H   Continuous Infusions: . sodium chloride 125 mL/hr at 09/18/17 0455  . dextrose 5 % and 0.45 % NaCl with KCl 20 mEq/L       LOS: 2 days   Time Spent in minutes   30 minutes  Laryssa Hassing D.O. on 09/18/2017 at 12:25 PM  Between 7am to 7pm - Pager - [phone]  After 7pm go to [email or web address] - password [email or web address]  And look for the night coverage person covering for me after hours  [facility] Office  [phone]

## 2017-09-18 NOTE — Progress Notes (Signed)
Patient feels a little bit better, a little bit less "tight" in the upper abdomen. She had a bowel movement today, and has been up and walking. NG output remains low.   Plans noted for CT directed biopsy of omental nodularity, to be done over at [facility] tomorrow morning (as originally scheduled as an outpatient), due to lack of availability on the IR schedule here.  Infectious disease opinion noted; it is nice to have the option of using immunosuppressive medical therapy for this patient's ileitis, if that proves to be the most appropriate course of action.  On exam, the patient appears comfortable, and alert. The abdomen is without overt distention or firmness. Bowel sounds are quiet.  Plan:  1. Await results of CT directed biopsy 2. We will not plan to round on the patient tomorrow, so please call us if input from [place] is needed prior to Thursday.  Cleotis Nipper, M.D. Pager [phone] If no answer or after 5 PM call [phone]

## 2017-09-18 NOTE — Consult Note (Signed)
Chief Complaint: Patient was seen in consultation today for omental mass biopsy Chief Complaint  Patient presents with  . Abdominal Pain  . Nausea  . Emesis   at the request of Dr Curly Shores  Referring Physician(s): Dr Marilynn Latino  Supervising Physician: Arne Cleveland  Patient Status: [facility] - In-pt  History of Present Illness: Terri [name] is a 83 y.o. female   Chron's disease Hx SBO July 2018 Resolved with Tx  CT 10/23./18: IMPRESSION: 1. Extensive mural thickening and mucosal hyperenhancement throughout the distal and terminal ileum, compatible with the reported clinical history of Crohn's disease. At this time, there are no overt surrounding inflammatory changes to suggest Crohn's exacerbation. 2. Unusual soft tissue nodularity noted throughout the omentum. This is of uncertain etiology and significance, however, this is most commonly seen in the setting of intraperitoneal malignancy. Further clinical evaluation and correlation with tumor markers is suggested. 3. Previously noted intraluminal form body remains in position in the terminal ileum. This is 8 dense cylindrical object measuring 16 x 13 x 13 mm.  Drr Buccini note 11/5: The patient's to most recent CT scans have shown some omental nodularity raising the question of intra-abdominal malignancy and omental "caking."  Outpatient tumor markers were significantly elevated,   Was scheduled for OP biopsy of omental mass at [facility] 09/19/17 Was admitted in mean time to [facility] for new abd pain symptoms [facility] unable to perform biopsy in more timely frame---- So plan is to Tx to [facility] by ambulance in am in originally scheduled bx slot. Pt will return to [facility] following biopsy She is aware and agreeable  Past Medical History:  Diagnosis Date  . Age-related macular degeneration, dry, both eyes   . Aortic insufficiency   . Aortic stenosis   . Arthritis    "hands" (05/31/2017)  . Asthma   . Basal cell  carcinoma of nose   . Crohn's disease ([place])   . GERD (gastroesophageal reflux disease)   . History of duodenal ulcer   . History of stomach ulcers   . HOH (hard of hearing)   . Hx of rheumatic fever   . Hypertension   . Hypothyroidism   . Positive TB test   . Rheumatic heart disease   . Small bowel obstruction ([place]) 05/31/2017  . Squamous cell cancer of skin of nose   . TIA (transient ischemic attack)   . TIA (transient ischemic attack) 1999?    Past Surgical History:  Procedure Laterality Date  . BASAL CELL CARCINOMA EXCISION  X 2   "face"  . CARPAL TUNNEL RELEASE Right   . CATARACT EXTRACTION W/ INTRAOCULAR LENS IMPLANT Bilateral   . CHOLECYSTECTOMY    . SQUAMOUS CELL CARCINOMA EXCISION  X1   "face"  . TONSILLECTOMY      Allergies: Patient has no known allergies.  Medications: Prior to Admission medications   Medication Sig Start Date End Date Taking? Authorizing Provider  amoxicillin-clavulanate (AUGMENTIN) 875-125 MG tablet Take 1 tablet 2 (two) times daily by mouth. 09/08/17  Yes [provider]  carboxymethylcellulose (REFRESH PLUS) 0.5 % SOLN Place 1 drop into both eyes daily as needed (dry eyes).   Yes [provider]  clidinium-chlordiazePOXIDE (LIBRAX) 5-2.5 MG capsule Take 1 capsule daily by mouth.    Yes [provider]  clopidogrel (PLAVIX) 75 MG tablet Take 75 mg by mouth daily. 04/26/17  Yes [provider]  folic acid (FOLVITE) 1 MG tablet Take 1 mg by mouth daily.  Yes [provider]  levothyroxine (SYNTHROID, LEVOTHROID) 100 MCG tablet Take 100 mcg by mouth daily before breakfast.   Yes [provider]  losartan (COZAAR) 50 MG tablet Take 50 mg by mouth daily. 04/26/17  Yes [provider]  Multiple Vitamin (MULTIVITAMIN WITH MINERALS) TABS tablet Take 1 tablet by mouth daily.   Yes [provider]  ondansetron (ZOFRAN) 4 MG tablet Take 1 tablet (4 mg total) by mouth every 6 (six)  hours as needed for nausea. 06/03/17  Yes Nita Sells, MD  oxybutynin (DITROPAN) 5 MG tablet Take 5 mg by mouth daily.   Yes [provider]  pantoprazole (PROTONIX) 40 MG tablet Take 40 mg by mouth every morning. 05/15/17  Yes [provider]  predniSONE (DELTASONE) 10 MG tablet Take 10 mg daily with breakfast by mouth.   Yes [provider]  sodium chloride (OCEAN) 0.65 % SOLN nasal spray Place 2 sprays as needed into both nostrils for congestion.   Yes [provider]  predniSONE (DELTASONE) 20 MG tablet Take 3 tablets (60 mg total) by mouth daily before breakfast. Patient not taking: Reported on 09/17/2017 06/03/17   Nita Sells, MD  predniSONE (DELTASONE) 50 MG tablet Take this course after 7/29 Patient not taking: Reported on 09/17/2017 06/03/17   Nita Sells, MD     Family History  Problem Relation Age of Onset  . Bladder Cancer Brother   . Breast cancer Daughter   . Crohn's disease Neg Hx     Social History   Socioeconomic History  . Marital status: Widowed    Spouse name: None  . Number of children: None  . Years of education: None  . Highest education level: None  Social Needs  . Financial resource strain: None  . Food insecurity - worry: None  . Food insecurity - inability: None  . Transportation needs - medical: None  . Transportation needs - non-medical: None  Occupational History  . None  Tobacco Use  . Smoking status: Former Smoker    Packs/day: 0.10    Years: 15.00    Pack years: 1.50    Types: Cigarettes    Last attempt to quit: 1969    Years since quitting: 49.8  . Smokeless tobacco: Never Used  Substance and Sexual Activity  . Alcohol use: No  . Drug use: No  . Sexual activity: Not Currently  Other Topics Concern  . None  Social History Narrative   Widowed 12/2016. Living with son. Retired Therapist, sports     Review of Systems: A 12 point ROS discussed and pertinent positives are indicated in the HPI  above.  All other systems are negative.  Review of Systems  Constitutional: Positive for activity change, appetite change and fatigue. Negative for fever.  Respiratory: Negative for shortness of breath.   Cardiovascular: Negative for chest pain.  Gastrointestinal: Positive for abdominal distention and abdominal pain.  Neurological: Positive for weakness.  Psychiatric/Behavioral: Negative for behavioral problems and confusion.    Vital Signs: BP 129/77   Pulse 72   Temp 98.5 F (36.9 C) (Oral)   Resp 18   Ht 5\' 2"  (1.575 m)   Wt 160 lb (72.6 kg)   SpO2 92%   BMI 29.26 kg/m   Physical Exam  Constitutional: She is oriented to person, place, and time.  Cardiovascular: Regular rhythm and normal heart sounds.  Pulmonary/Chest: Breath sounds normal.  Abdominal: Bowel sounds are normal. There is generalized tenderness.  Neurological: She is oriented to  person, place, and time.  Skin: Skin is warm and dry.  Psychiatric: She has a normal mood and affect. Her behavior is normal.  Nursing note and vitals reviewed.   Imaging: Ct Abdomen Pelvis W Contrast  Result Date: 09/16/2017 CLINICAL DATA:  Bowel obstruction EXAM: CT ABDOMEN AND PELVIS WITH CONTRAST TECHNIQUE: Multidetector CT imaging of the abdomen and pelvis was performed using the standard protocol following bolus administration of intravenous contrast. CONTRAST:  12mL ISOVUE-300 IOPAMIDOL (ISOVUE-300) INJECTION 61% COMPARISON:  09/16/2017, 09/04/2017, 05/31/2017 FINDINGS: Lower chest: Lung bases demonstrate no acute consolidation or pleural effusion. Heart size upper normal. Atherosclerotic calcifications. Hepatobiliary: Surgical absence of gallbladder. Mild intra and extrahepatic biliary enlargement similar compared to prior. Pancreas: Atrophic.  No inflammation Spleen: Normal in size without focal abnormality. Adrenals/Urinary Tract: Adrenal glands are within normal limits. Multifocal cortical scarring in the kidneys. No  hydronephrosis. The bladder is within normal limits Stomach/Bowel: Esophageal tube is present, the tip terminates along the wall of the proximal stomach. Multiple loops of markedly dilated, fluid-filled small bowel, measuring up to 6 cm in diameter. Transition point visualized in the right pelvis, series 3, image number 69 and is related to a disease segment of terminal ileum which appears very thickened. The colon is collapsed. Re- demonstrated rectangular 17 mm foreign object in the terminal ileum. Vascular/Lymphatic: Aortic atherosclerosis. No enlarged abdominal or pelvic lymph nodes. Reproductive: Uterus and bilateral adnexa are unremarkable. Other: Negative for free air. Tiny amount of free fluid in the pelvis. Re- demonstrated mild nodularity of the omentum most notable in the right upper quadrant. Musculoskeletal: Degenerative changes of the spine. No acute or suspicious bone lesion. IMPRESSION: 1. Findings consistent with high-grade mechanical small bowel obstruction. Transition point visualized in the right lower quadrant/pelvis and is related to a diseased segment of terminal ileum which demonstrates significant wall thickening and possible stricturing. Negative for free air. 2. Re- demonstrated 17 mm rectangular foreign object in the terminal ileum. 3. Mild nodularity of the omentum re- demonstrated, correlation with tumor markers previously recommended. Electronically Signed   By: Donavan Foil M.D.   On: 09/16/2017 22:14   Ct Entero Abd/pelvis W Contast  Result Date: 09/04/2017 CLINICAL DATA:  83 year old female with history of mid abdominal pain for the past 4 months. History of Crohn's disease. EXAM: CT ABDOMEN AND PELVIS WITH CONTRAST (ENTEROGRAPHY) TECHNIQUE: Multidetector CT of the abdomen and pelvis during bolus administration of intravenous contrast. Negative oral contrast was given. CONTRAST:  140mL ISOVUE-300 IOPAMIDOL (ISOVUE-300) INJECTION 61% COMPARISON:  CT the abdomen and pelvis  05/31/2017. FINDINGS: Lower chest: Calcifications of the aortic valve. Mild cardiomegaly. Hepatobiliary: No cystic or solid hepatic lesions. No intra or extrahepatic biliary ductal dilatation. Gallbladder is not visualized and presumably surgically absent (no surgical clips are noted in the gallbladder fossa). Pancreas: No pancreatic mass. No pancreatic ductal dilatation. No pancreatic or peripancreatic fluid or inflammatory changes. Spleen: Unremarkable. Adrenals/Urinary Tract: Multifocal cortical thinning in the kidneys bilaterally. No suspicious renal lesions. No hydroureteronephrosis. Urinary bladder is normal in appearance. Bilateral adrenal glands nor appearance. Stomach/Bowel: The appearance of the stomach is normal. There is no pathologic dilatation of small bowel or colon. Enterography images demonstrate profound mural thickening of the distal and terminal ileum, with hyperenhancement of the associated mucosa. No overt surrounding inflammatory changes are noted at this time. No other focal areas of mural thickening are noted in the small bowel or colon. In the lumen of the terminal ileum shortly before the ileocecal valve there is again a  dense cylindrical shaped foreign body measuring 16 x 13 x 13 mm. Normal appendix. Vascular/Lymphatic: Aortic atherosclerosis, without evidence of aneurysm or dissection in the abdominal or pelvic vasculature. No lymphadenopathy noted in the abdomen or pelvis. Reproductive: Uterus and ovaries are unremarkable in appearance. Other: Multiple areas of apparent soft tissue nodularity throughout the omentum, measuring up to 9 mm in diameter (axial image 77 of series 3). No significant volume of ascites. No pneumoperitoneum. Mild diffuse body wall edema. Musculoskeletal: There are no aggressive appearing lytic or blastic lesions noted in the visualized portions of the skeleton. IMPRESSION: 1. Extensive mural thickening and mucosal hyperenhancement throughout the distal and  terminal ileum, compatible with the reported clinical history of Crohn's disease. At this time, there are no overt surrounding inflammatory changes to suggest Crohn's exacerbation. 2. Unusual soft tissue nodularity noted throughout the omentum. This is of uncertain etiology and significance, however, this is most commonly seen in the setting of intraperitoneal malignancy. Further clinical evaluation and correlation with tumor markers is suggested. 3. Previously noted intraluminal form body remains in position in the terminal ileum. This is 8 dense cylindrical object measuring 16 x 13 x 13 mm. 4. Aortic atherosclerosis. 5. There are calcifications of the aortic valve. Echocardiographic correlation for evaluation of potential valvular dysfunction may be warranted if clinically indicated. 6. Additional incidental findings, as above. Aortic Atherosclerosis (ICD10-I70.0). Electronically Signed   By: Vinnie Langton M.D.   On: 09/04/2017 10:43   Dg Abdomen Acute W/chest  Result Date: 09/16/2017 CLINICAL DATA:  Initial evaluation for acute vomiting, abdominal pain. EXAM: DG ABDOMEN ACUTE W/ 1V CHEST COMPARISON:  Prior CT from 09/04/2017. FINDINGS: Cardiac and mediastinal silhouettes within normal limits. Lungs hypoinflated. Mild bibasilar atelectasis. No focal infiltrates. No pulmonary edema or pleural effusion. No pneumothorax. Multiple dilated gas-filled loops of small bowel seen within the upper mid and left abdomen. These measure up to 5.2 cm. Associated air-fluid levels. Findings consistent with small bowel obstruction. No soft tissue mass or abnormal calcification. No free air. IMPRESSION: 1. Multiple dilated gas-filled loops of small bowel with associated air-fluid levels, consistent with small bowel obstruction. 2. Shallow lung inflation with mild bibasilar atelectasis. Electronically Signed   By: Jeannine Boga M.D.   On: 09/16/2017 19:16   Dg Abd Portable 1v  Result Date: 09/18/2017 CLINICAL DATA:   Abdominal pain, small bowel obstruction. EXAM: PORTABLE ABDOMEN - 1 VIEW COMPARISON:  Abdominal CT scan of September 16, 2017 and abdominal series of the same day. FINDINGS: There remain loops of moderately distended gas-filled small bowel in the midline. The uric free of gaseous distention of portions of the jejunum reaches 8.5 cm. No significant gas or stool is observed in the colon. The stomach is nondistended. The esophagogastric tube tip in proximal port project in the gastric cardia. There is contrast material within the urinary bladder. IMPRESSION: Persistent mid to distal small bowel obstruction with significant gaseous distention of small-bowel loops. No evidence of perforation currently. Electronically Signed   By: David  Martinique M.D.   On: 09/18/2017 10:44    Labs:  CBC: Recent Labs    06/02/17 0313 09/16/17 1507 09/17/17 0711 09/18/17 0849  WBC 7.6 12.3* 9.3 12.0*  HGB 10.5* 12.6 10.9* 11.2*  HCT 33.8* 39.0 33.9* 35.4*  PLT 176 279 209 254    COAGS: Recent Labs    09/18/17 0558  INR 1.19    BMP: Recent Labs    06/03/17 0548 09/16/17 1507 09/17/17 0711 09/18/17 0849  NA 140 131*  134* 137  K 3.9 4.4 4.3 4.2  CL 112* 97* 101 106  CO2 22 23 24 22   GLUCOSE 119* 139* 130* 90  BUN 14 11 12 16   CALCIUM 8.0* 8.6* 7.8* 7.7*  CREATININE 0.97 1.24* 1.20* 1.20*  GFRNONAA 53* 39* 40* 40*  GFRAA >60 45* 47* 47*    LIVER FUNCTION TESTS: Recent Labs    05/31/17 1146 09/16/17 1507  BILITOT 1.0 0.5  AST 20 13*  ALT 10* 8*  ALKPHOS 83 65  PROT 6.7 6.6  ALBUMIN 3.4* 3.0*    TUMOR MARKERS: No results for input(s): AFPTM, CEA, CA199, CHROMGRNA in the last 8760 hours.  Assessment and Plan:  Hx SBO; Hx Chron's New findings on CT: omental nodularity/mass Scheduled for omental mass biopsy Will Tx to [facility] in am for biopsy  (this was originally scheduled as OP at [facility]---this is original time slot) Will return too [facility] after bx Risks and benefits discussed with the patient  including, but not limited to bleeding, infection, damage to adjacent structures or low yield requiring additional tests. All of the patient's questions were answered, patient is agreeable to proceed. Consent signed and in chart.   Thank you for this interesting consult.  I greatly enjoyed meeting Terri [name] and look forward to participating in their care.  A copy of this report was sent to the requesting provider on this date.  Electronically Signed: Lavonia Drafts, PA-C 09/18/2017, 12:54 PM   I spent a total of 40 Minutes    in face to face in clinical consultation, greater than 50% of which was counseling/coordinating care for omental mass bx

## 2017-09-19 ENCOUNTER — Encounter (HOSPITAL_COMMUNITY): Payer: Self-pay

## 2017-09-19 ENCOUNTER — Ambulatory Visit (HOSPITAL_COMMUNITY)
Admit: 2017-09-19 | Discharge: 2017-09-19 | Disposition: A | Discharge status: Home / Self Care | Payer: Medicare Other | Attending: Gastroenterology | Admitting: Gastroenterology

## 2017-09-19 ENCOUNTER — Ambulatory Visit (HOSPITAL_COMMUNITY): Payer: Medicare Other

## 2017-09-19 ENCOUNTER — Ambulatory Visit (HOSPITAL_COMMUNITY): Admission: RE | Admit: 2017-09-19 | Payer: Medicare Other | Source: Ambulatory Visit

## 2017-09-19 ENCOUNTER — Ambulatory Visit (HOSPITAL_COMMUNITY)
Admit: 2017-09-19 | Discharge: 2017-09-19 | Disposition: A | Discharge status: Home / Self Care | Payer: Medicare Other | Attending: Interventional Radiology | Admitting: Interventional Radiology

## 2017-09-19 LAB — BASIC METABOLIC PANEL
ANION GAP: 7 (ref 5–15)
BUN: 16 mg/dL (ref 6–20)
CALCIUM: 7.6 mg/dL — AB (ref 8.9–10.3)
CO2: 23 mmol/L (ref 22–32)
Chloride: 108 mmol/L (ref 101–111)
Creatinine, Ser: 1.14 mg/dL — ABNORMAL HIGH (ref 0.44–1.00)
GFR calc Af Amer: 50 mL/min — ABNORMAL LOW (ref >60)
GFR, EST NON AFRICAN AMERICAN: 43 mL/min — AB (ref >60)
Glucose, Bld: 86 mg/dL (ref 65–99)
POTASSIUM: 4.5 mmol/L (ref 3.5–5.1)
SODIUM: 138 mmol/L (ref 135–145)

## 2017-09-19 LAB — CBC
HCT: 34.1 % — ABNORMAL LOW (ref 36.0–46.0)
Hemoglobin: 10.7 g/dL — ABNORMAL LOW (ref 12.0–15.0)
MCH: 27.8 pg (ref 26.0–34.0)
MCHC: 31.4 g/dL (ref 30.0–36.0)
MCV: 88.6 fL (ref 78.0–100.0)
PLATELETS: 246 10*3/uL (ref 150–400)
RBC: 3.85 MIL/uL — AB (ref 3.87–5.11)
RDW: 14.1 % (ref 11.5–15.5)
WBC: 11.1 10*3/uL — AB (ref 4.0–10.5)

## 2017-09-19 MED ORDER — MIDAZOLAM HCL 2 MG/2ML IJ SOLN
INTRAMUSCULAR | Status: AC
  Filled 2017-09-19: qty 4

## 2017-09-19 MED ORDER — FENTANYL CITRATE (PF) 100 MCG/2ML IJ SOLN
INTRAMUSCULAR | Status: AC
  Filled 2017-09-19: qty 4

## 2017-09-19 NOTE — Progress Notes (Signed)
Report called to Leanord Hawking RN at [facility], notified of patient not receiving any medications, unable to do procedure, patient had too much inflammation, that they would have to wait till the inflammation went down, to see if mass was still present. [facility] called for return transport.

## 2017-09-19 NOTE — Progress Notes (Signed)
Interventional Radiology Progress Note   Attempt at CT guided bx of omental nodule.   CT shows on-going SBO, with reactive changes, ascites, edema.  There is no safe spot for biopsy today.   Discussed with Dr. Tana Coast.    Would suggest another attempt vs diagnostic CT in a few weeks after resolution of SBO and the ascites/inflammation.    Call with questions/concerns.   Signed,  Dulcy Fanny. Earleen Newport, DO

## 2017-09-19 NOTE — Sedation Documentation (Signed)
No target is visualized in the abd to proceed with bx at this time per [facility] DO.

## 2017-09-19 NOTE — Progress Notes (Signed)
    CC:  SBO  Subjective: Still distended, no BS, she says she is not quite as distended. Walked 4 times yesterday, small BM.  Rare flatus. She is disappointed they could not do BX.    Objective: Vital signs in last 24 hours: Temp:  [97.7 F (36.5 C)-98.6 F (37 C)] 97.7 F (36.5 C) (11/07 1010) Pulse Rate:  [74-107] 80 (11/07 1010) Resp:  [16-21] 20 (11/07 1010) BP: (94-155)/(47-85) 155/62 (11/07 1010) SpO2:  [93 %-99 %] 96 % (11/07 1010) Weight:  [72.6 kg (160 lb)] 72.6 kg (160 lb) (11/07 0842) Last BM Date: 09/18/17 NPO 3029 IV 900 urine 50 NG BM x 1 Afebrile, VSS Labs OK CT today:  Limited noncontrast CT during planning phase for attempted omental nodule CT-guided biopsy demonstrates there is no safe target on today's study, with the most reasonable site for biopsy masked by edema and ascitic fluid. Biopsy was deferred at this time.  Images also demonstrate persistent small bowel obstruction, with unchanged position of the dense object at the ileocecal valve.   Intake/Output from previous day: 11/06 0701 - 11/07 0700 In: 3029.2 [I.V.:3029.2] Out: 951 [Urine:900; Emesis/NG output:50; Stool:1] Intake/Output this shift: No intake/output data recorded.  General appearance: alert, cooperative and no distress Resp: clear to auscultation bilaterally GI: soft, but still distended, Small BM yesterday, flatus x 1 she is aware of. No BS this AM  Lab Results:  Recent Labs    09/18/17 0849 09/19/17 0324  WBC 12.0* 11.1*  HGB 11.2* 10.7*  HCT 35.4* 34.1*  PLT 254 246    BMET Recent Labs    09/18/17 0849 09/19/17 0324  NA 137 138  K 4.2 4.5  CL 106 108  CO2 22 23  GLUCOSE 90 86  BUN 16 16  CREATININE 1.20* 1.14*  CALCIUM 7.7* 7.6*   PT/INR Recent Labs    09/18/17 0558  LABPROT 15.0  INR 1.19    Recent Labs  Lab 09/16/17 1507  AST 13*  ALT 8*  ALKPHOS 65  BILITOT 0.5  PROT 6.6  ALBUMIN 3.0*     Lipase     Component Value Date/Time   LIPASE  17 09/16/2017 1507     Medications: . enoxaparin (LOVENOX) injection  40 mg Subcutaneous Q24H  . folic acid  1 mg Oral Daily  . levothyroxine  50 mcg Intravenous Daily  . mouth rinse  15 mL Mouth Rinse BID  . methylPREDNISolone (SOLU-MEDROL) injection  60 mg Intravenous Daily  . metoprolol tartrate  2.5 mg Intravenous Q6H  . pantoprazole (PROTONIX) IV  40 mg Intravenous Q24H    Assessment/Plan Recurrent small bowel obstruction Crohn's disease with inflammation of the distal terminal ileum/ foreign object distal terminal ileum. Medical management currently - Methylprednisolone Abnormal omental pattern on CT/biopsy could not be done secondary to SBO and edema BX @[facility]  09/19/17 History of GERD/duodenal ulcers - Protonix Mild Renal insuffiencey - creatinine 1.24  Reported mild aortic stenosis. Hypertension Hypothyroid - synthroid Remote history of rheumatic heart disease.   History of tobacco use History of TIA.   Fen: N.p.o./IV fluids ID: None DVT: SCD/Lovenox    Plan:  Resume suction/medical management.     LOS: 3 days    Terri [name] 09/19/2017 [phone]

## 2017-09-19 NOTE — Progress Notes (Signed)
Called [facility] this morning to ensure patient is scheduled for transport and emphasized that patient needs to be at [facility] Stay by 0800. [facility] Coordinator stated she cannot guarantee but assured that patient will be transported via [place].

## 2017-09-19 NOTE — Progress Notes (Signed)
[facility] here, patient transported back to [facility].

## 2017-09-19 NOTE — Progress Notes (Signed)
Set up transportation with [facility] for patient to be transported to [facility] for biopsy. Emphasized that patient needed to be at [facility] no later than 0800. [facility] coordinator acknowledged; stated patient has been placed on schedule for transport.

## 2017-09-19 NOTE — Progress Notes (Signed)
[facility]                                                                              Patient Demographics  Terri [name], is a 83 y.o. female, DOB - [birth year], IPJ:[id number]  Admit date - 09/16/2017   Admitting Physician Etta Quill, DO  Outpatient Primary MD for the patient is [name], [name], MD  Outpatient specialists:   LOS - 3  days   Medical records reviewed and are as summarized below:    Chief Complaint  Patient presents with  . Abdominal Pain  . Nausea  . Emesis       Brief summary   HPI on 09/16/2017 by Dr. Jolene Schimke [name] a 83 y.o.femalewith medical history significant ofCrohn's disease diagnosed x5 years ago. SBO in July this year felt to be secondary to Crohn's disease. SBO resolved / improved after NGT and steroids though she has been having some abdominal symptoms in the intervening time. Patient presents to the ED with c/o nausea, abd distention, concern for another SBO. Of note patient just had CT scan on 10/23 which showed findings suggestive of Crohns, omental nodularity worrisome for malignancy (patient had biopsy scheduled at [facility] on Wed this week), and foreign body in terminal ilium, possibly a pill or tablet that has been present since CT scan in July of this year     Assessment & Plan    Principal Problem:   SBO (small bowel obstruction) ([place]) -CT abdomen showed high-grade mechanical SBO with transition point in the right lower quadrant/pelvis and related to disease segment of terminal ileum demonstrating wall thickening and possible stricturing -General surgery, GI consulted.  Continue conservative management with IV fluids, antiemetics, pain control - NPO with NG in place, encouraged ambulation   Active Problems: Omental nodularity - IR was consulted, discussed with Dr. Tresea Mall, unable to do-the omental biopsy due to significant reactive inflammation and SBO.  Recommended outpatient biopsy  after the acute hospitalization is resolved    Crohn's disease ([place]) -GI consulted, ID was consulted by GI for possible Remicade use.  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  Patient had completed 6 months of INH -  ID felt no other therapy indicated prior to considering of biological agents.    Foreign body of small intestine, subsequent encounter -Noted on CT abdomen pelvis 17 mm firm object in the terminal ileum General surgery following  Hypothyroidism Continue Synthroid  Code Status: Full CODE STATUS DVT Prophylaxis:  Lovenox  Family Communication: Discussed in detail with the patient, all imaging results, lab results explained to the patient    Disposition Plan:  Time Spent in minutes  25 minutes  Procedures:    Consultants:   General surgery GI IR  Antimicrobials:      Medications  Scheduled Meds: . enoxaparin (LOVENOX) injection  40 mg Subcutaneous Q24H  . folic acid  1 mg Oral Daily  . levothyroxine  50 mcg Intravenous Daily  . mouth rinse  15 mL Mouth Rinse BID  . methylPREDNISolone (SOLU-MEDROL) injection  60 mg Intravenous Daily  . metoprolol tartrate  2.5 mg Intravenous Q6H  . pantoprazole (PROTONIX) IV  40 mg Intravenous Q24H   Continuous Infusions: . sodium chloride 125 mL/hr at 09/19/17 1122  . dextrose 5 % and 0.45 % NaCl with KCl 20 mEq/L     PRN Meds:.acetaminophen **OR** acetaminophen, hydroxypropyl methylcellulose / hypromellose, iopamidol, morphine injection, ondansetron **OR** ondansetron (ZOFRAN) IV   Antibiotics   Anti-infectives (From admission, onward)   None        Subjective:   Terri [name] was seen and examined today. Feeling dry mouth, no BM. Patient denies dizziness, chest pain, shortness of breath, new weakness, numbess, tingling. No acute events overnight.  Disappointed about no biopsy   Objective:   Vitals:   09/19/17 0017 09/19/17 0608 09/19/17 1104 09/19/17 1346  BP: 94/65 123/67 140/85 (!) 147/61  Pulse: 93 89 96 99    Resp:  18 20 20   Temp:  98.4 F (36.9 C) 98.4 F (36.9 C) 97.6 F (36.4 C)  TempSrc:   Oral Oral  SpO2:  94% 96% 96%  Weight:      Height:        Intake/Output Summary (Last 24 hours) at 09/19/2017 1454 Last data filed at 09/19/2017 0651 Gross per 24 hour  Intake 3029.16 ml  Output 950 ml  Net 2079.16 ml     Wt Readings from Last 3 Encounters:  09/16/17 72.6 kg (160 lb)  09/19/17 72.6 kg (160 lb)  05/31/17 77.1 kg (170 lb)     Exam  General: Alert and oriented x 3, NAD  Eyes: ,  HEENT:   Cardiovascular: S1 S2 auscultated, no rubs, murmurs or gallops. Regular rate and rhythm.  Respiratory: Clear to auscultation bilaterally, no wheezing, rales or rhonchi  Gastrointestinal: Soft, NT, hypoactive BS, distended, + bowel sounds  Ext: no pedal edema bilaterally  Neuro: AAOx3, Cr N's II- XII. Strength 5/5 upper and lower extremities bilaterally, speech clear,   Musculoskeletal: No digital cyanosis, clubbing  Skin: No rashes  Psych: Normal affect and demeanor, alert and oriented x3    Data Reviewed:  I have personally reviewed following labs and imaging studies  Micro Results No results found for this or any previous visit (from the past 240 hour(s)).  Radiology Reports Ct Abdomen Pelvis W Contrast  Result Date: 09/16/2017 CLINICAL DATA:  Bowel obstruction EXAM: CT ABDOMEN AND PELVIS WITH CONTRAST TECHNIQUE: Multidetector CT imaging of the abdomen and pelvis was performed using the standard protocol following bolus administration of intravenous contrast. CONTRAST:  125mL ISOVUE-300 IOPAMIDOL (ISOVUE-300) INJECTION 61% COMPARISON:  09/16/2017, 09/04/2017, 05/31/2017 FINDINGS: Lower chest: Lung bases demonstrate no acute consolidation or pleural effusion. Heart size upper normal. Atherosclerotic calcifications. Hepatobiliary: Surgical absence of gallbladder. Mild intra and extrahepatic biliary enlargement similar compared to prior. Pancreas: Atrophic.  No inflammation  Spleen: Normal in size without focal abnormality. Adrenals/Urinary Tract: Adrenal glands are within normal limits. Multifocal cortical scarring in the kidneys. No hydronephrosis. The bladder is within normal limits Stomach/Bowel: Esophageal tube is present, the tip terminates along the wall of the proximal stomach. Multiple loops of markedly dilated, fluid-filled small bowel, measuring up to 6 cm in diameter. Transition point visualized in the right pelvis, series 3, image number 69 and is related to a disease segment of terminal ileum which appears very thickened. The colon is collapsed. Re- demonstrated rectangular 17 mm foreign object in the terminal ileum. Vascular/Lymphatic: Aortic atherosclerosis. No enlarged abdominal or pelvic lymph nodes. Reproductive: Uterus and bilateral adnexa are unremarkable. Other: Negative for free air. Tiny amount of free fluid in the pelvis. Re- demonstrated mild  nodularity of the omentum most notable in the right upper quadrant. Musculoskeletal: Degenerative changes of the spine. No acute or suspicious bone lesion. IMPRESSION: 1. Findings consistent with high-grade mechanical small bowel obstruction. Transition point visualized in the right lower quadrant/pelvis and is related to a diseased segment of terminal ileum which demonstrates significant wall thickening and possible stricturing. Negative for free air. 2. Re- demonstrated 17 mm rectangular foreign object in the terminal ileum. 3. Mild nodularity of the omentum re- demonstrated, correlation with tumor markers previously recommended. Electronically Signed   By: Donavan Foil M.D.   On: 09/16/2017 22:14   Ct Abd Limited W/o Cm  Result Date: 09/19/2017 CLINICAL DATA:  83 year old female with a history of Crohn's disease, with CT dated 05/31/2017 showing bowel obstruction. Follow-up CT imaging September 04, 2017 again shows evidence of Crohn disease, with new omental nodularity. Patient was referred as outpatient for  CT-guided omental biopsy. In the interval the patient was admitted with recurrent bowel obstruction, 09/16/2017. Patient presents as inpatient for attempted omental biopsy. EXAM: CT ABDOMEN WITHOUT CONTRAST LIMITED TECHNIQUE: Multidetector CT imaging of the abdomen was performed following the standard protocol without IV contrast. COMPARISON:  Multiple prior, most recent 09/16/2017, 09/04/2017 FINDINGS: Limited CT of the abdomen was performed for planning purposes for a scheduled CT-guided omental biopsy. Initial image demonstrates calcific density in the distal small bowel, unchanged in position, uncertain etiology. Again, this does not appear to represent the site of the obstruction, in this patient with changes of Crohn's disease and likely strictures. The omental nodularity is not as well-defined on the current CT study. The lower abdominal nodularity is mass thigh reactive ascitic fluid, with no safe target identified during the targeting phase of the procedure. There are small nodules in the upper abdomen adjacent to the distal stomach that were are also targeted, without successful identification given respiratory motion and the size of the nodules, which are smaller than the slice thicknesses of the planning CT. Given that there was no safe site for biopsy, biopsy deferred at this time. Body wall and omental anasarca/edema. Reactive ascitic fluid within small bowel loops, particularly at the right lower quadrant at the site of the previously suggested biopsy target. Evidence of continued bowel obstruction. IMPRESSION: Limited noncontrast CT during planning phase for attempted omental nodule CT-guided biopsy demonstrates there is no safe target on today's study, with the most reasonable site for biopsy masked by edema and ascitic fluid. Biopsy was deferred at this time. Images also demonstrate persistent small bowel obstruction, with unchanged position of the dense object at the ileocecal valve. Signed, Dulcy Fanny. Earleen Newport, DO [facility] Radiology PLAN: Would suggest another attempt at omental guided biopsy once the patient's condition improves, of small bowel obstruction and reactive changes/ascites resolves. Either CT-guided biopsy or repeat diagnostic CT scan may be considered. Electronically Signed   By: Corrie Mckusick D.O.   On: 09/19/2017 10:28   Ct Entero Abd/pelvis W Contast  Result Date: 09/04/2017 CLINICAL DATA:  83 year old female with history of mid abdominal pain for the past 4 months. History of Crohn's disease. EXAM: CT ABDOMEN AND PELVIS WITH CONTRAST (ENTEROGRAPHY) TECHNIQUE: Multidetector CT of the abdomen and pelvis during bolus administration of intravenous contrast. Negative oral contrast was given. CONTRAST:  142mL ISOVUE-300 IOPAMIDOL (ISOVUE-300) INJECTION 61% COMPARISON:  CT the abdomen and pelvis 05/31/2017. FINDINGS: Lower chest: Calcifications of the aortic valve. Mild cardiomegaly. Hepatobiliary: No cystic or solid hepatic lesions. No intra or extrahepatic biliary ductal  dilatation. Gallbladder is not visualized and presumably surgically absent (no surgical clips are noted in the gallbladder fossa). Pancreas: No pancreatic mass. No pancreatic ductal dilatation. No pancreatic or peripancreatic fluid or inflammatory changes. Spleen: Unremarkable. Adrenals/Urinary Tract: Multifocal cortical thinning in the kidneys bilaterally. No suspicious renal lesions. No hydroureteronephrosis. Urinary bladder is normal in appearance. Bilateral adrenal glands nor appearance. Stomach/Bowel: The appearance of the stomach is normal. There is no pathologic dilatation of small bowel or colon. Enterography images demonstrate profound mural thickening of the distal and terminal ileum, with hyperenhancement of the associated mucosa. No overt surrounding inflammatory changes are noted at this time. No other focal areas of mural thickening are noted in the small bowel  or colon. In the lumen of the terminal ileum shortly before the ileocecal valve there is again a dense cylindrical shaped foreign body measuring 16 x 13 x 13 mm. Normal appendix. Vascular/Lymphatic: Aortic atherosclerosis, without evidence of aneurysm or dissection in the abdominal or pelvic vasculature. No lymphadenopathy noted in the abdomen or pelvis. Reproductive: Uterus and ovaries are unremarkable in appearance. Other: Multiple areas of apparent soft tissue nodularity throughout the omentum, measuring up to 9 mm in diameter (axial image 77 of series 3). No significant volume of ascites. No pneumoperitoneum. Mild diffuse body wall edema. Musculoskeletal: There are no aggressive appearing lytic or blastic lesions noted in the visualized portions of the skeleton. IMPRESSION: 1. Extensive mural thickening and mucosal hyperenhancement throughout the distal and terminal ileum, compatible with the reported clinical history of Crohn's disease. At this time, there are no overt surrounding inflammatory changes to suggest Crohn's exacerbation. 2. Unusual soft tissue nodularity noted throughout the omentum. This is of uncertain etiology and significance, however, this is most commonly seen in the setting of intraperitoneal malignancy. Further clinical evaluation and correlation with tumor markers is suggested. 3. Previously noted intraluminal form body remains in position in the terminal ileum. This is 8 dense cylindrical object measuring 16 x 13 x 13 mm. 4. Aortic atherosclerosis. 5. There are calcifications of the aortic valve. Echocardiographic correlation for evaluation of potential valvular dysfunction may be warranted if clinically indicated. 6. Additional incidental findings, as above. Aortic Atherosclerosis (ICD10-I70.0). Electronically Signed   By: Vinnie Langton M.D.   On: 09/04/2017 10:43   Dg Abdomen Acute W/chest  Result Date: 09/16/2017 CLINICAL DATA:  Initial evaluation for acute vomiting, abdominal  pain. EXAM: DG ABDOMEN ACUTE W/ 1V CHEST COMPARISON:  Prior CT from 09/04/2017. FINDINGS: Cardiac and mediastinal silhouettes within normal limits. Lungs hypoinflated. Mild bibasilar atelectasis. No focal infiltrates. No pulmonary edema or pleural effusion. No pneumothorax. Multiple dilated gas-filled loops of small bowel seen within the upper mid and left abdomen. These measure up to 5.2 cm. Associated air-fluid levels. Findings consistent with small bowel obstruction. No soft tissue mass or abnormal calcification. No free air. IMPRESSION: 1. Multiple dilated gas-filled loops of small bowel with associated air-fluid levels, consistent with small bowel obstruction. 2. Shallow lung inflation with mild bibasilar atelectasis. Electronically Signed   By: Jeannine Boga M.D.   On: 09/16/2017 19:16   Dg Abd Portable 1v  Result Date: 09/18/2017 CLINICAL DATA:  Abdominal pain, small bowel obstruction. EXAM: PORTABLE ABDOMEN - 1 VIEW COMPARISON:  Abdominal CT scan of September 16, 2017 and abdominal series of the same day. FINDINGS: There remain loops of moderately distended gas-filled small bowel in the midline. The uric free of gaseous distention of portions of the jejunum reaches 8.5 cm. No significant gas or stool is observed  in the colon. The stomach is nondistended. The esophagogastric tube tip in proximal port project in the gastric cardia. There is contrast material within the urinary bladder. IMPRESSION: Persistent mid to distal small bowel obstruction with significant gaseous distention of small-bowel loops. No evidence of perforation currently. Electronically Signed   By: David  Martinique M.D.   On: 09/18/2017 10:44    Lab Data:  CBC: Recent Labs  Lab 09/16/17 1507 09/17/17 0711 09/18/17 0849 09/19/17 0324  WBC 12.3* 9.3 12.0* 11.1*  HGB 12.6 10.9* 11.2* 10.7*  HCT 39.0 33.9* 35.4* 34.1*  MCV 87.1 87.6 88.9 88.6  PLT 279 209 254 462   Basic Metabolic Panel: Recent Labs  Lab 09/16/17 1507  09/17/17 0711 09/18/17 0849 09/19/17 0324  NA 131* 134* 137 138  K 4.4 4.3 4.2 4.5  CL 97* 101 106 108  CO2 23 24 22 23   GLUCOSE 139* 130* 90 86  BUN 11 12 16 16   CREATININE 1.24* 1.20* 1.20* 1.14*  CALCIUM 8.6* 7.8* 7.7* 7.6*   GFR: Estimated Creatinine Clearance: 34.3 mL/min (A) (by C-G formula based on SCr of 1.14 mg/dL (H)). Liver Function Tests: Recent Labs  Lab 09/16/17 1507  AST 13*  ALT 8*  ALKPHOS 65  BILITOT 0.5  PROT 6.6  ALBUMIN 3.0*   Recent Labs  Lab 09/16/17 1507  LIPASE 17   No results for input(s): AMMONIA in the last 168 hours. Coagulation Profile: Recent Labs  Lab 09/18/17 0558  INR 1.19   Cardiac Enzymes: No results for input(s): CKTOTAL, CKMB, CKMBINDEX, TROPONINI in the last 168 hours. BNP (last 3 results) No results for input(s): PROBNP in the last 8760 hours. HbA1C: No results for input(s): HGBA1C in the last 72 hours. CBG: No results for input(s): GLUCAP in the last 168 hours. Lipid Profile: No results for input(s): CHOL, HDL, LDLCALC, TRIG, CHOLHDL, LDLDIRECT in the last 72 hours. Thyroid Function Tests: No results for input(s): TSH, T4TOTAL, FREET4, T3FREE, THYROIDAB in the last 72 hours. Anemia Panel: No results for input(s): VITAMINB12, FOLATE, FERRITIN, TIBC, IRON, RETICCTPCT in the last 72 hours. Urine analysis:    Component Value Date/Time   COLORURINE YELLOW 09/17/2017 0425   APPEARANCEUR CLEAR 09/17/2017 0425   LABSPEC 1.044 (H) 09/17/2017 0425   PHURINE 5.0 09/17/2017 0425   GLUCOSEU NEGATIVE 09/17/2017 0425   HGBUR NEGATIVE 09/17/2017 0425   BILIRUBINUR NEGATIVE 09/17/2017 0425   KETONESUR NEGATIVE 09/17/2017 0425   PROTEINUR NEGATIVE 09/17/2017 0425   NITRITE NEGATIVE 09/17/2017 0425   LEUKOCYTESUR NEGATIVE 09/17/2017 0425     Shaymus Eveleth M.D. [facility] 09/19/2017, 2:54 PM  Pager: [phone] Between 7am to 7pm - call Pager - [phone]  After 7pm go to [email or web address] - password [email or web address]  Call night  coverage person covering after 7pm

## 2017-09-20 ENCOUNTER — Inpatient Hospital Stay (HOSPITAL_COMMUNITY): Payer: Medicare Other

## 2017-09-20 MED ORDER — BISACODYL 10 MG RE SUPP
10.0000 mg | Freq: Once | RECTAL | Status: AC
  Administered 2017-09-20: 10 mg via RECTAL
  Filled 2017-09-20: qty 1

## 2017-09-20 NOTE — Care Management Important Message (Signed)
Important Message  Patient Details  Name: Terri [name] MRN: [id number] Date of Birth: 05-04-1931   Medicare Important Message Given:  Yes    [name] 09/20/2017, 1:17 PM

## 2017-09-20 NOTE — Progress Notes (Signed)
Attempted CT bx not able to be performed b/o dilated bowel loops.  Pt w/out abd pn.  Small vol NG output continues.  KUB shows persistent dil of SB loops but now some gas in colon--??things opening up slightly.  Abd slightly distended, moderately tympanitic.  Pt leaning toward trial of Remicade rather than surgery.  Options:  1. Trial of Remicade, although it could take weeks to work, if it is going to work 2. Surgical resection, which would be definitive treatment as well as address diagnostic concerns of possible intra-abd malig 3. D/w Modena Jansky, PA-C--consideration of TNA, which I would support. 4. Perhaps one option is to see if pt "opens up" in the next few days and is able to tolerate po's--if so, that would "buy time" for a trial of Remicade.  Cleotis Nipper, M.D. Pager [phone] If no answer or after 5 PM call [phone]

## 2017-09-20 NOTE — Progress Notes (Signed)
[facility]                                                                              Patient Demographics  Terri [name], is a 83 y.o. female, DOB - 11/15/1930, MHD:[id number]  Admit date - 09/16/2017   Admitting Physician Etta Quill, DO  Outpatient Primary MD for the patient is [name], [name], MD  Outpatient specialists:   LOS - 4  days   Medical records reviewed and are as summarized below:    Chief Complaint  Patient presents with  . Abdominal Pain  . Nausea  . Emesis       Brief summary   HPI on 09/16/2017 by Dr. Jolene Schimke Braughtonis a 83 y.o.femalewith medical history significant ofCrohn's disease diagnosed x5 years ago. SBO in July this year felt to be secondary to Crohn's disease. SBO resolved / improved after NGT and steroids though she has been having some abdominal symptoms in the intervening time. Patient presents to the ED with c/o nausea, abd distention, concern for another SBO. Of note patient just had CT scan on 10/23 which showed findings suggestive of Crohns, omental nodularity worrisome for malignancy (patient had biopsy scheduled at [facility] on Wed this week), and foreign body in terminal ilium, possibly a pill or tablet that has been present since CT scan in July of this year     Assessment & Plan    Principal Problem:   SBO (small bowel obstruction) ([place]) -CT abdomen showed high-grade mechanical SBO with transition point in the right lower quadrant/pelvis and related to disease segment of terminal ileum demonstrating wall thickening and possible stricturing -General surgery, GI consulted.  Continue conservative management with IV fluids, antiemetics, pain control -Patient reporting passing latus, had small BM today after Dulcolax suppository.   - Will follow surgery recommendations.  Active Problems: Omental nodularity - IR was consulted, discussed with Dr. Tresea Mall, unable to do-the omental biopsy due to  significant reactive inflammation and SBO.  Recommended outpatient biopsy after the acute hospitalization is resolved    Crohn's disease ([place]) -GI consulted, ID was consulted by GI for possible Remicade use.  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  Patient had completed 6 months of INH -  ID felt no other therapy indicated prior to considering of biological agents.    Foreign body of small intestine, subsequent encounter -Noted on CT abdomen pelvis 17 mm firm object in the terminal ileum General surgery following  Hypothyroidism Continue Synthroid  Code Status: Full CODE STATUS DVT Prophylaxis:  Lovenox  Family Communication: Discussed in detail with the patient, all imaging results, lab results explained to the patient    Disposition Plan:  Time Spent in minutes  25 minutes  Procedures:    Consultants:   General surgery GI IR  Antimicrobials:      Medications  Scheduled Meds: . enoxaparin (LOVENOX) injection  40 mg Subcutaneous Q24H  . folic acid  1 mg Oral Daily  . levothyroxine  50 mcg Intravenous Daily  . mouth rinse  15 mL Mouth Rinse BID  . methylPREDNISolone (SOLU-MEDROL) injection  60 mg Intravenous Daily  . metoprolol tartrate  2.5  mg Intravenous Q6H  . pantoprazole (PROTONIX) IV  40 mg Intravenous Q24H   Continuous Infusions: . sodium chloride 125 mL/hr at 09/20/17 1005  . dextrose 5 % and 0.45 % NaCl with KCl 20 mEq/L     PRN Meds:.acetaminophen **OR** acetaminophen, hydroxypropyl methylcellulose / hypromellose, iopamidol, morphine injection, ondansetron **OR** ondansetron (ZOFRAN) IV   Antibiotics   Anti-infectives (From admission, onward)   None        Subjective:   Terri [name] was seen and examined today. Passing gas, small BM this am after suppository.  No abdominal pain, fevers or chills. Patient denies dizziness, chest pain, shortness of breath, new weakness, numbess, tingling. No acute events overnight.  Objective:   Vitals:   09/19/17  1346 09/20/17 0051 09/20/17 0426 09/20/17 0645  BP: (!) 147/61 114/70 120/79 134/75  Pulse: 99 89 (!) 121 (!) 121  Resp: 20 18 18    Temp: 97.6 F (36.4 C) 98.2 F (36.8 C) 98.4 F (36.9 C) 98.4 F (36.9 C)  TempSrc: Oral Oral Oral Oral  SpO2: 96% 96% 93% 93%  Weight:      Height:        Intake/Output Summary (Last 24 hours) at 09/20/2017 1248 Last data filed at 09/20/2017 0800 Gross per 24 hour  Intake 1250 ml  Output 351 ml  Net 899 ml     Wt Readings from Last 3 Encounters:  09/16/17 72.6 kg (160 lb)  09/19/17 72.6 kg (160 lb)  05/31/17 77.1 kg (170 lb)     Exam  General: Alert and oriented x 3, NAD, NGT in place   Eyes:   HEENT:  Atraumatic, normocephalic  Cardiovascular: S1 S2 auscultated, RRR No pedal edema b/l  Respiratory: Clear to auscultation bilaterally, no wheezing, rales or rhonchi  Gastrointestinal: Soft, nontender, hypoactive bowel sounds, distended  Ext: no pedal edema bilaterally  Neuro: no new deficits  Musculoskeletal: No digital cyanosis, clubbing  Skin: No rashes  Psych: Normal affect and demeanor, alert and oriented x3    Data Reviewed:  I have personally reviewed following labs and imaging studies  Micro Results No results found for this or any previous visit (from the past 240 hour(s)).  Radiology Reports Ct Abdomen Pelvis W Contrast  Result Date: 09/16/2017 CLINICAL DATA:  Bowel obstruction EXAM: CT ABDOMEN AND PELVIS WITH CONTRAST TECHNIQUE: Multidetector CT imaging of the abdomen and pelvis was performed using the standard protocol following bolus administration of intravenous contrast. CONTRAST:  168mL ISOVUE-300 IOPAMIDOL (ISOVUE-300) INJECTION 61% COMPARISON:  09/16/2017, 09/04/2017, 05/31/2017 FINDINGS: Lower chest: Lung bases demonstrate no acute consolidation or pleural effusion. Heart size upper normal. Atherosclerotic calcifications. Hepatobiliary: Surgical absence of gallbladder. Mild intra and extrahepatic biliary  enlargement similar compared to prior. Pancreas: Atrophic.  No inflammation Spleen: Normal in size without focal abnormality. Adrenals/Urinary Tract: Adrenal glands are within normal limits. Multifocal cortical scarring in the kidneys. No hydronephrosis. The bladder is within normal limits Stomach/Bowel: Esophageal tube is present, the tip terminates along the wall of the proximal stomach. Multiple loops of markedly dilated, fluid-filled small bowel, measuring up to 6 cm in diameter. Transition point visualized in the right pelvis, series 3, image number 69 and is related to a disease segment of terminal ileum which appears very thickened. The colon is collapsed. Re- demonstrated rectangular 17 mm foreign object in the terminal ileum. Vascular/Lymphatic: Aortic atherosclerosis. No enlarged abdominal or pelvic lymph nodes. Reproductive: Uterus and bilateral adnexa are unremarkable. Other: Negative for free air. Tiny amount of free fluid in  the pelvis. Re- demonstrated mild nodularity of the omentum most notable in the right upper quadrant. Musculoskeletal: Degenerative changes of the spine. No acute or suspicious bone lesion. IMPRESSION: 1. Findings consistent with high-grade mechanical small bowel obstruction. Transition point visualized in the right lower quadrant/pelvis and is related to a diseased segment of terminal ileum which demonstrates significant wall thickening and possible stricturing. Negative for free air. 2. Re- demonstrated 17 mm rectangular foreign object in the terminal ileum. 3. Mild nodularity of the omentum re- demonstrated, correlation with tumor markers previously recommended. Electronically Signed   By: Donavan Foil M.D.   On: 09/16/2017 22:14   Ct Abd Limited W/o Cm  Result Date: 09/19/2017 CLINICAL DATA:  83 year old female with a history of Crohn's disease, with CT dated 05/31/2017 showing bowel obstruction. Follow-up CT imaging September 04, 2017 again shows evidence of Crohn disease,  with new omental nodularity. Patient was referred as outpatient for CT-guided omental biopsy. In the interval the patient was admitted with recurrent bowel obstruction, 09/16/2017. Patient presents as inpatient for attempted omental biopsy. EXAM: CT ABDOMEN WITHOUT CONTRAST LIMITED TECHNIQUE: Multidetector CT imaging of the abdomen was performed following the standard protocol without IV contrast. COMPARISON:  Multiple prior, most recent 09/16/2017, 09/04/2017 FINDINGS: Limited CT of the abdomen was performed for planning purposes for a scheduled CT-guided omental biopsy. Initial image demonstrates calcific density in the distal small bowel, unchanged in position, uncertain etiology. Again, this does not appear to represent the site of the obstruction, in this patient with changes of Crohn's disease and likely strictures. The omental nodularity is not as well-defined on the current CT study. The lower abdominal nodularity is mass thigh reactive ascitic fluid, with no safe target identified during the targeting phase of the procedure. There are small nodules in the upper abdomen adjacent to the distal stomach that were are also targeted, without successful identification given respiratory motion and the size of the nodules, which are smaller than the slice thicknesses of the planning CT. Given that there was no safe site for biopsy, biopsy deferred at this time. Body wall and omental anasarca/edema. Reactive ascitic fluid within small bowel loops, particularly at the right lower quadrant at the site of the previously suggested biopsy target. Evidence of continued bowel obstruction. IMPRESSION: Limited noncontrast CT during planning phase for attempted omental nodule CT-guided biopsy demonstrates there is no safe target on today's study, with the most reasonable site for biopsy masked by edema and ascitic fluid. Biopsy was deferred at this time. Images also demonstrate persistent small bowel obstruction, with  unchanged position of the dense object at the ileocecal valve. Signed, Dulcy Fanny. Earleen Newport, DO [facility] PLAN: Would suggest another attempt at omental guided biopsy once the patient's condition improves, of small bowel obstruction and reactive changes/ascites resolves. Either CT-guided biopsy or repeat diagnostic CT scan may be considered. Electronically Signed   By: Corrie Mckusick D.O.   On: 09/19/2017 10:28   Ct Entero Abd/pelvis W Contast  Result Date: 09/04/2017 CLINICAL DATA:  83 year old female with history of mid abdominal pain for the past 4 months. History of Crohn's disease. EXAM: CT ABDOMEN AND PELVIS WITH CONTRAST (ENTEROGRAPHY) TECHNIQUE: Multidetector CT of the abdomen and pelvis during bolus administration of intravenous contrast. Negative oral contrast was given. CONTRAST:  171mL ISOVUE-300 IOPAMIDOL (ISOVUE-300) INJECTION 61% COMPARISON:  CT the abdomen and pelvis 05/31/2017. FINDINGS: Lower chest: Calcifications of the aortic valve. Mild cardiomegaly. Hepatobiliary: No cystic or solid hepatic lesions. No  intra or extrahepatic biliary ductal dilatation. Gallbladder is not visualized and presumably surgically absent (no surgical clips are noted in the gallbladder fossa). Pancreas: No pancreatic mass. No pancreatic ductal dilatation. No pancreatic or peripancreatic fluid or inflammatory changes. Spleen: Unremarkable. Adrenals/Urinary Tract: Multifocal cortical thinning in the kidneys bilaterally. No suspicious renal lesions. No hydroureteronephrosis. Urinary bladder is normal in appearance. Bilateral adrenal glands nor appearance. Stomach/Bowel: The appearance of the stomach is normal. There is no pathologic dilatation of small bowel or colon. Enterography images demonstrate profound mural thickening of the distal and terminal ileum, with hyperenhancement of the associated mucosa. No overt surrounding inflammatory changes are noted at this  time. No other focal areas of mural thickening are noted in the small bowel or colon. In the lumen of the terminal ileum shortly before the ileocecal valve there is again a dense cylindrical shaped foreign body measuring 16 x 13 x 13 mm. Normal appendix. Vascular/Lymphatic: Aortic atherosclerosis, without evidence of aneurysm or dissection in the abdominal or pelvic vasculature. No lymphadenopathy noted in the abdomen or pelvis. Reproductive: Uterus and ovaries are unremarkable in appearance. Other: Multiple areas of apparent soft tissue nodularity throughout the omentum, measuring up to 9 mm in diameter (axial image 77 of series 3). No significant volume of ascites. No pneumoperitoneum. Mild diffuse body wall edema. Musculoskeletal: There are no aggressive appearing lytic or blastic lesions noted in the visualized portions of the skeleton. IMPRESSION: 1. Extensive mural thickening and mucosal hyperenhancement throughout the distal and terminal ileum, compatible with the reported clinical history of Crohn's disease. At this time, there are no overt surrounding inflammatory changes to suggest Crohn's exacerbation. 2. Unusual soft tissue nodularity noted throughout the omentum. This is of uncertain etiology and significance, however, this is most commonly seen in the setting of intraperitoneal malignancy. Further clinical evaluation and correlation with tumor markers is suggested. 3. Previously noted intraluminal form body remains in position in the terminal ileum. This is 8 dense cylindrical object measuring 16 x 13 x 13 mm. 4. Aortic atherosclerosis. 5. There are calcifications of the aortic valve. Echocardiographic correlation for evaluation of potential valvular dysfunction may be warranted if clinically indicated. 6. Additional incidental findings, as above. Aortic Atherosclerosis (ICD10-I70.0). Electronically Signed   By: Vinnie Langton M.D.   On: 09/04/2017 10:43   Dg Abdomen Acute W/chest  Result Date:  09/16/2017 CLINICAL DATA:  Initial evaluation for acute vomiting, abdominal pain. EXAM: DG ABDOMEN ACUTE W/ 1V CHEST COMPARISON:  Prior CT from 09/04/2017. FINDINGS: Cardiac and mediastinal silhouettes within normal limits. Lungs hypoinflated. Mild bibasilar atelectasis. No focal infiltrates. No pulmonary edema or pleural effusion. No pneumothorax. Multiple dilated gas-filled loops of small bowel seen within the upper mid and left abdomen. These measure up to 5.2 cm. Associated air-fluid levels. Findings consistent with small bowel obstruction. No soft tissue mass or abnormal calcification. No free air. IMPRESSION: 1. Multiple dilated gas-filled loops of small bowel with associated air-fluid levels, consistent with small bowel obstruction. 2. Shallow lung inflation with mild bibasilar atelectasis. Electronically Signed   By: Jeannine Boga M.D.   On: 09/16/2017 19:16   Dg Abd Portable 1v  Result Date: 09/20/2017 CLINICAL DATA:  83 year old female with small bowel obstruction. EXAM: PORTABLE ABDOMEN - 1 VIEW COMPARISON:  09/18/2017 FINDINGS: Moderately distended loops of gas-filled small bowel are again seen throughout the mid abdomen. Air is identified within the colon and rectum. Please note, evaluation for free intraperitoneal air is limited on single supine radiograph. No significant osseous abnormalities. IMPRESSION:  1. Persistent moderate distention of gas-filled small bowel loops within the mid abdomen. 2. Air is now identified within the colon and rectum. Electronically Signed   By: Kristopher Oppenheim M.D.   On: 09/20/2017 08:12   Dg Abd Portable 1v  Result Date: 09/18/2017 CLINICAL DATA:  Abdominal pain, small bowel obstruction. EXAM: PORTABLE ABDOMEN - 1 VIEW COMPARISON:  Abdominal CT scan of September 16, 2017 and abdominal series of the same day. FINDINGS: There remain loops of moderately distended gas-filled small bowel in the midline. The uric free of gaseous distention of portions of the  jejunum reaches 8.5 cm. No significant gas or stool is observed in the colon. The stomach is nondistended. The esophagogastric tube tip in proximal port project in the gastric cardia. There is contrast material within the urinary bladder. IMPRESSION: Persistent mid to distal small bowel obstruction with significant gaseous distention of small-bowel loops. No evidence of perforation currently. Electronically Signed   By: David  Martinique M.D.   On: 09/18/2017 10:44    Lab Data:  CBC: Recent Labs  Lab 09/16/17 1507 09/17/17 0711 09/18/17 0849 09/19/17 0324  WBC 12.3* 9.3 12.0* 11.1*  HGB 12.6 10.9* 11.2* 10.7*  HCT 39.0 33.9* 35.4* 34.1*  MCV 87.1 87.6 88.9 88.6  PLT 279 209 254 735   Basic Metabolic Panel: Recent Labs  Lab 09/16/17 1507 09/17/17 0711 09/18/17 0849 09/19/17 0324  NA 131* 134* 137 138  K 4.4 4.3 4.2 4.5  CL 97* 101 106 108  CO2 23 24 22 23   GLUCOSE 139* 130* 90 86  BUN 11 12 16 16   CREATININE 1.24* 1.20* 1.20* 1.14*  CALCIUM 8.6* 7.8* 7.7* 7.6*   GFR: Estimated Creatinine Clearance: 34.3 mL/min (A) (by C-G formula based on SCr of 1.14 mg/dL (H)). Liver Function Tests: Recent Labs  Lab 09/16/17 1507  AST 13*  ALT 8*  ALKPHOS 65  BILITOT 0.5  PROT 6.6  ALBUMIN 3.0*   Recent Labs  Lab 09/16/17 1507  LIPASE 17   No results for input(s): AMMONIA in the last 168 hours. Coagulation Profile: Recent Labs  Lab 09/18/17 0558  INR 1.19   Cardiac Enzymes: No results for input(s): CKTOTAL, CKMB, CKMBINDEX, TROPONINI in the last 168 hours. BNP (last 3 results) No results for input(s): PROBNP in the last 8760 hours. HbA1C: No results for input(s): HGBA1C in the last 72 hours. CBG: No results for input(s): GLUCAP in the last 168 hours. Lipid Profile: No results for input(s): CHOL, HDL, LDLCALC, TRIG, CHOLHDL, LDLDIRECT in the last 72 hours. Thyroid Function Tests: No results for input(s): TSH, T4TOTAL, FREET4, T3FREE, THYROIDAB in the last 72  hours. Anemia Panel: No results for input(s): VITAMINB12, FOLATE, FERRITIN, TIBC, IRON, RETICCTPCT in the last 72 hours. Urine analysis:    Component Value Date/Time   COLORURINE YELLOW 09/17/2017 0425   APPEARANCEUR CLEAR 09/17/2017 0425   LABSPEC 1.044 (H) 09/17/2017 0425   PHURINE 5.0 09/17/2017 0425   GLUCOSEU NEGATIVE 09/17/2017 0425   HGBUR NEGATIVE 09/17/2017 0425   BILIRUBINUR NEGATIVE 09/17/2017 0425   KETONESUR NEGATIVE 09/17/2017 0425   PROTEINUR NEGATIVE 09/17/2017 0425   NITRITE NEGATIVE 09/17/2017 0425   LEUKOCYTESUR NEGATIVE 09/17/2017 0425     Ripudeep Rai M.D. [facility] 09/20/2017, 12:48 PM  Pager: [phone] Between 7am to 7pm - call Pager - [phone]  After 7pm go to [email or web address] - password [email or web address]  Call night coverage person covering after 7pm

## 2017-09-20 NOTE — Progress Notes (Deleted)
    CC:  SBO  Subjective: No real change this a.m.  She did have a small bowel movement with a suppository and has intermittent gas.  Abdomen remains distended, bowel sounds are hypoactive. Patient says she has been walking a lot and that she can hear some rumbling after walking. Objective: Vital signs in last 24 hours: Temp:  [97.6 F (36.4 C)-98.4 F (36.9 C)] 98.4 F (36.9 C) (11/08 0426) Pulse Rate:  [76-121] 121 (11/08 0426) Resp:  [16-21] 18 (11/08 0426) BP: (114-155)/(61-85) 120/79 (11/08 0426) SpO2:  [93 %-99 %] 93 % (11/08 0426) Weight:  [72.6 kg (160 lb)] 72.6 kg (160 lb) (11/07 0842) Last BM Date: 09/18/17 NPO 1250 IV 100 urine recorded 250 NG No BM recorded Afebrile, VSS No labs this AM Film this AM not read, but still has SBO  Intake/Output from previous day: 11/07 0701 - 11/08 0700 In: 1250 [I.V.:1250] Out: 350 [Urine:100; Emesis/NG output:250] Intake/Output this shift: No intake/output data recorded.  General appearance: alert, cooperative, no distress and Up in chair with NG in place. GI: Still somewhat distended.  Bowel sounds are hypoactive.  Some flatus.  Small bowel movement after suppository.  Lab Results:  Recent Labs    09/18/17 0849 09/19/17 0324  WBC 12.0* 11.1*  HGB 11.2* 10.7*  HCT 35.4* 34.1*  PLT 254 246    BMET Recent Labs    09/18/17 0849 09/19/17 0324  NA 137 138  K 4.2 4.5  CL 106 108  CO2 22 23  GLUCOSE 90 86  BUN 16 16  CREATININE 1.20* 1.14*  CALCIUM 7.7* 7.6*   PT/INR Recent Labs    09/18/17 0558  LABPROT 15.0  INR 1.19    Recent Labs  Lab 09/16/17 1507  AST 13*  ALT 8*  ALKPHOS 65  BILITOT 0.5  PROT 6.6  ALBUMIN 3.0*     Lipase     Component Value Date/Time   LIPASE 17 09/16/2017 1507     Medications: . enoxaparin (LOVENOX) injection  40 mg Subcutaneous Q24H  . folic acid  1 mg Oral Daily  . levothyroxine  50 mcg Intravenous Daily  . mouth rinse  15 mL Mouth Rinse BID  .  methylPREDNISolone (SOLU-MEDROL) injection  60 mg Intravenous Daily  . metoprolol tartrate  2.5 mg Intravenous Q6H  . pantoprazole (PROTONIX) IV  40 mg Intravenous Q24H   . sodium chloride 125 mL/hr at 09/19/17 1848  . dextrose 5 % and 0.45 % NaCl with KCl 20 mEq/L     Anti-infectives (From admission, onward)   None      Assessment/Plan Recurrent small bowel obstruction Crohn's disease with inflammation of the distal terminal ileum/foreign object distal terminal ileum. Medical management currently - Methylprednisolone Abnormal omental pattern on CT/biopsy could not be done secondary to SBO and edema BX @[facility]  09/19/17 History of GERD/duodenal ulcers- Protonix Mild Renal insuffiencey- creatinine 1.24  Reported mild aortic stenosis. Hypertension Hypothyroid-synthroid Remote history of rheumatic heart disease.  History of tobacco use History of TIA.  Fen: N.p.o./IV fluids ID: None DVT: SCD/Lovenox   Plan: Film is pending, no significant clinical improvement noted.  No labs this a.m. but I will get a pre-albumin and we may need to consider TNA if she does not open soon.  Will review with Dr. Marlou Starks.       LOS: 4 days    Cozy Veale 09/20/2017 [phone]

## 2017-09-20 NOTE — Progress Notes (Signed)
Subjective/Chief Complaint: No complaints. She thinks she passed a lot of flatus   Objective: Vital signs in last 24 hours: Temp:  [97.6 F (36.4 C)-98.4 F (36.9 C)] 98.4 F (36.9 C) (11/08 0645) Pulse Rate:  [76-121] 121 (11/08 0645) Resp:  [16-21] 18 (11/08 0426) BP: (114-155)/(61-85) 134/75 (11/08 0645) SpO2:  [93 %-99 %] 93 % (11/08 0645) Weight:  [72.6 kg (160 lb)] 72.6 kg (160 lb) (11/07 0842) Last BM Date: 09/18/17  Intake/Output from previous day: 11/07 0701 - 11/08 0700 In: 1250 [I.V.:1250] Out: 350 [Urine:100; Emesis/NG output:250] Intake/Output this shift: No intake/output data recorded.  General appearance: alert and cooperative Resp: clear to auscultation bilaterally Cardio: regular rate and rhythm GI: soft,nontender. less distended.  Lab Results:  Recent Labs    09/18/17 0849 09/19/17 0324  WBC 12.0* 11.1*  HGB 11.2* 10.7*  HCT 35.4* 34.1*  PLT 254 246   BMET Recent Labs    09/18/17 0849 09/19/17 0324  NA 137 138  K 4.2 4.5  CL 106 108  CO2 22 23  GLUCOSE 90 86  BUN 16 16  CREATININE 1.20* 1.14*  CALCIUM 7.7* 7.6*   PT/INR Recent Labs    09/18/17 0558  LABPROT 15.0  INR 1.19   ABG No results for input(s): PHART, HCO3 in the last 72 hours.  Invalid input(s): PCO2, PO2  Studies/Results: Ct Abd Limited W/o Cm  Result Date: 09/19/2017 CLINICAL DATA:  83 year old female with a history of Crohn's disease, with CT dated 05/31/2017 showing bowel obstruction. Follow-up CT imaging September 04, 2017 again shows evidence of Crohn disease, with new omental nodularity. Patient was referred as outpatient for CT-guided omental biopsy. In the interval the patient was admitted with recurrent bowel obstruction, 09/16/2017. Patient presents as inpatient for attempted omental biopsy. EXAM: CT ABDOMEN WITHOUT CONTRAST LIMITED TECHNIQUE: Multidetector CT imaging of the abdomen was performed following the standard protocol without IV contrast.  COMPARISON:  Multiple prior, most recent 09/16/2017, 09/04/2017 FINDINGS: Limited CT of the abdomen was performed for planning purposes for a scheduled CT-guided omental biopsy. Initial image demonstrates calcific density in the distal small bowel, unchanged in position, uncertain etiology. Again, this does not appear to represent the site of the obstruction, in this patient with changes of Crohn's disease and likely strictures. The omental nodularity is not as well-defined on the current CT study. The lower abdominal nodularity is mass thigh reactive ascitic fluid, with no safe target identified during the targeting phase of the procedure. There are small nodules in the upper abdomen adjacent to the distal stomach that were are also targeted, without successful identification given respiratory motion and the size of the nodules, which are smaller than the slice thicknesses of the planning CT. Given that there was no safe site for biopsy, biopsy deferred at this time. Body wall and omental anasarca/edema. Reactive ascitic fluid within small bowel loops, particularly at the right lower quadrant at the site of the previously suggested biopsy target. Evidence of continued bowel obstruction. IMPRESSION: Limited noncontrast CT during planning phase for attempted omental nodule CT-guided biopsy demonstrates there is no safe target on today's study, with the most reasonable site for biopsy masked by edema and ascitic fluid. Biopsy was deferred at this time. Images also demonstrate persistent small bowel obstruction, with unchanged position of the dense object at the ileocecal valve. Signed, Dulcy Fanny. Earleen Newport, DO [facility] Radiology PLAN: Would suggest another attempt at omental guided biopsy once the patient's condition improves, of  small bowel obstruction and reactive changes/ascites resolves. Either CT-guided biopsy or repeat diagnostic CT scan may be considered.  Electronically Signed   By: Corrie Mckusick D.O.   On: 09/19/2017 10:28    Anti-infectives: Anti-infectives (From admission, onward)   None      Assessment/Plan: s/p * No surgery found * repeat abd xray today. If improved consider clamping ng Will follow Crohn's per GI  LOS: 4 days    TOTH III,Shabnam Ladd S 09/20/2017

## 2017-09-21 ENCOUNTER — Inpatient Hospital Stay (HOSPITAL_COMMUNITY): Payer: Medicare Other

## 2017-09-21 LAB — COMPREHENSIVE METABOLIC PANEL
ALBUMIN: 2.5 g/dL — AB (ref 3.5–5.0)
ALK PHOS: 45 U/L (ref 38–126)
ALT: 11 U/L — AB (ref 14–54)
AST: 16 U/L (ref 15–41)
Anion gap: 9 (ref 5–15)
BUN: 16 mg/dL (ref 6–20)
CALCIUM: 7.9 mg/dL — AB (ref 8.9–10.3)
CO2: 23 mmol/L (ref 22–32)
CREATININE: 1.11 mg/dL — AB (ref 0.44–1.00)
Chloride: 108 mmol/L (ref 101–111)
GFR calc Af Amer: 51 mL/min — ABNORMAL LOW (ref >60)
GFR calc non Af Amer: 44 mL/min — ABNORMAL LOW (ref >60)
GLUCOSE: 89 mg/dL (ref 65–99)
Potassium: 4.4 mmol/L (ref 3.5–5.1)
SODIUM: 140 mmol/L (ref 135–145)
Total Bilirubin: 0.5 mg/dL (ref 0.3–1.2)
Total Protein: 4.9 g/dL — ABNORMAL LOW (ref 6.5–8.1)

## 2017-09-21 LAB — CBC
HCT: 33 % — ABNORMAL LOW (ref 36.0–46.0)
Hemoglobin: 10.4 g/dL — ABNORMAL LOW (ref 12.0–15.0)
MCH: 28.5 pg (ref 26.0–34.0)
MCHC: 31.5 g/dL (ref 30.0–36.0)
MCV: 90.4 fL (ref 78.0–100.0)
PLATELETS: 216 10*3/uL (ref 150–400)
RBC: 3.65 MIL/uL — ABNORMAL LOW (ref 3.87–5.11)
RDW: 14.5 % (ref 11.5–15.5)
WBC: 11.8 10*3/uL — ABNORMAL HIGH (ref 4.0–10.5)

## 2017-09-21 LAB — GLUCOSE, CAPILLARY: GLUCOSE-CAPILLARY: 115 mg/dL — AB (ref 65–99)

## 2017-09-21 LAB — PREALBUMIN: Prealbumin: 11.3 mg/dL — ABNORMAL LOW (ref 18–38)

## 2017-09-21 MED ORDER — TRAVASOL 10 % IV SOLN
INTRAVENOUS | Status: AC
  Administered 2017-09-21: 18:00:00 via INTRAVENOUS
  Filled 2017-09-21: qty 360

## 2017-09-21 MED ORDER — LIDOCAINE VISCOUS 2 % MT SOLN
OROMUCOSAL | Status: AC
  Administered 2017-09-21: 3 mL via OROMUCOSAL
  Filled 2017-09-21: qty 15

## 2017-09-21 MED ORDER — SODIUM CHLORIDE 0.9% FLUSH
10.0000 mL | INTRAVENOUS | Status: DC | PRN
  Administered 2017-09-21 – 2017-09-29 (×5): 10 mL
  Filled 2017-09-21 (×5): qty 40

## 2017-09-21 MED ORDER — INSULIN ASPART 100 UNIT/ML ~~LOC~~ SOLN
0.0000 [IU] | Freq: Four times a day (QID) | SUBCUTANEOUS | Status: DC
  Administered 2017-09-22: 2 [IU] via SUBCUTANEOUS
  Administered 2017-09-22 – 2017-09-23 (×4): 3 [IU] via SUBCUTANEOUS
  Administered 2017-09-23: 2 [IU] via SUBCUTANEOUS
  Administered 2017-09-23 – 2017-09-24 (×4): 3 [IU] via SUBCUTANEOUS
  Administered 2017-09-24: 2 [IU] via SUBCUTANEOUS
  Administered 2017-09-25: 3 [IU] via SUBCUTANEOUS
  Administered 2017-09-25: 2 [IU] via SUBCUTANEOUS
  Administered 2017-09-25: 3 [IU] via SUBCUTANEOUS
  Administered 2017-09-26: 2 [IU] via SUBCUTANEOUS
  Administered 2017-09-26: 3 [IU] via SUBCUTANEOUS
  Administered 2017-09-27: 5 [IU] via SUBCUTANEOUS
  Administered 2017-09-27: 3 [IU] via SUBCUTANEOUS

## 2017-09-21 MED ORDER — FUROSEMIDE 10 MG/ML IJ SOLN
20.0000 mg | Freq: Once | INTRAMUSCULAR | Status: AC
  Administered 2017-09-21: 20 mg via INTRAVENOUS
  Filled 2017-09-21: qty 2

## 2017-09-21 MED ORDER — SODIUM CHLORIDE 0.9 % IV SOLN
INTRAVENOUS | Status: DC
  Administered 2017-09-23: 13:00:00 via INTRAVENOUS

## 2017-09-21 MED ORDER — LIDOCAINE VISCOUS 2 % MT SOLN
15.0000 mL | Freq: Once | OROMUCOSAL | Status: AC
  Administered 2017-09-21: 3 mL via OROMUCOSAL

## 2017-09-21 NOTE — Progress Notes (Signed)
[facility]                                                                              Patient Demographics  Terri [name], is a 83 y.o. female, DOB - 06/08/1931, LFY:[id number]  Admit date - 09/16/2017   Admitting Physician Etta Quill, DO  Outpatient Primary MD for the patient is [name], [name], MD  Outpatient specialists:   LOS - 5  days   Medical records reviewed and are as summarized below:    Chief Complaint  Patient presents with  . Abdominal Pain  . Nausea  . Emesis       Brief summary   HPI on 09/16/2017 by Dr. Jolene Schimke Braughtonis a 83 y.o.femalewith medical history significant ofCrohn's disease diagnosed x5 years ago. SBO in July this year felt to be secondary to Crohn's disease. SBO resolved / improved after NGT and steroids though she has been having some abdominal symptoms in the intervening time. Patient presents to the ED with c/o nausea, abd distention, concern for another SBO. Of note patient just had CT scan on 10/23 which showed findings suggestive of Crohns, omental nodularity worrisome for malignancy (patient had biopsy scheduled at [facility] on Wed this week), and foreign body in terminal ilium, possibly a pill or tablet that has been present since CT scan in July of this year     Assessment & Plan    Principal Problem:   SBO (small bowel obstruction) ([place]) -CT abdomen showed high-grade mechanical SBO with transition point in the right lower quadrant/pelvis and related to disease segment of terminal ileum demonstrating wall thickening and possible stricturing -General surgery, GI consulted.  Continue conservative management with IV fluids, antiemetics, pain control -Abdominal x-ray showed persistent high-grade SBO, NGT displaced.  IR requested to replace NG under fluoroscopy -Due to prolonged ileus/SBO, place PICC line and start TNA   Active Problems: Omental nodularity - IR was consulted, discussed with  Dr. Tresea Mall, unable to do-the omental biopsy due to significant reactive inflammation and SBO.  Recommended outpatient biopsy after the acute hospitalization is resolved    Crohn's disease ([place]) -GI consulted, ID was consulted by GI for possible Remicade use.  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  Patient had completed 6 months of INH -  ID felt no other therapy indicated prior to considering of biological agents.    Foreign body of small intestine, subsequent encounter -Noted on CT abdomen pelvis 17 mm firm object in the terminal ileum General surgery following  Hypothyroidism Continue Synthroid  Dyspnea/wheezing Likely due to fluid overload, I/O's with +5.9 L. Decrease IV fluids to 50 cc/ hour with Lasix 20 mg IV x1  Code Status: Full CODE STATUS DVT Prophylaxis:  Lovenox  Family Communication: Discussed in detail with the patient, all imaging results, lab results explained to the patient    Disposition Plan:  Time Spent in minutes  25 minutes  Procedures:    Consultants:   General surgery GI IR  Antimicrobials:      Medications  Scheduled Meds: . enoxaparin (LOVENOX) injection  40 mg Subcutaneous Q24H  . insulin aspart  0-15 Units Subcutaneous Q6H  .  levothyroxine  50 mcg Intravenous Daily  . mouth rinse  15 mL Mouth Rinse BID  . methylPREDNISolone (SOLU-MEDROL) injection  60 mg Intravenous Daily  . metoprolol tartrate  2.5 mg Intravenous Q6H  . pantoprazole (PROTONIX) IV  40 mg Intravenous Q24H   Continuous Infusions: . sodium chloride 50 mL/hr at 09/21/17 1050  . sodium chloride    . TPN ADULT (ION)     PRN Meds:.acetaminophen **OR** acetaminophen, hydroxypropyl methylcellulose / hypromellose, iopamidol, morphine injection, ondansetron **OR** ondansetron (ZOFRAN) IV   Antibiotics   Anti-infectives (From admission, onward)   None        Subjective:   Terri [name] was seen and examined today. No BM, slight wheezing, passing flatus.  No abdominal pain.   No fevers or chills.  Patient denies dizziness, chest pain, shortness of breath, new weakness, numbess, tingling. No acute events overnight.  Objective:   Vitals:   09/21/17 0023 09/21/17 0506 09/21/17 0700 09/21/17 1100  BP: 115/72 (!) 118/92 (!) 147/95 128/76  Pulse: 79 (!) 112    Resp:  20    Temp:  98.3 F (36.8 C) 98 F (36.7 C)   TempSrc:  Oral Oral   SpO2:  95% 98%   Weight:    82.2 kg (181 lb 3.5 oz)  Height:        Intake/Output Summary (Last 24 hours) at 09/21/2017 1244 Last data filed at 09/21/2017 1139 Gross per 24 hour  Intake 1385 ml  Output 862 ml  Net 523 ml     Wt Readings from Last 3 Encounters:  09/21/17 82.2 kg (181 lb 3.5 oz)  09/19/17 72.6 kg (160 lb)  05/31/17 77.1 kg (170 lb)     Exam  General: Alert and oriented x 3, NAD, NGT  Eyes:   HEENT:  Atraumatic, normocephalic  Cardiovascular: S1 S2 auscultated, no MRG, RRR, trace pedal edema b/l  Respiratory: Bilateral fine expiratory wheezing  Gastrointestinal: Soft, hypoactive bowel sounds, distended, NT  Ext: trace pedal edema bilaterally  Neuro: no new deficits  Musculoskeletal: No digital cyanosis, clubbing  Skin: No rashes  Psych: Normal affect and demeanor, alert and oriented x3    Data Reviewed:  I have personally reviewed following labs and imaging studies  Micro Results No results found for this or any previous visit (from the past 240 hour(s)).  Radiology Reports Dg Abd 1 View  Result Date: 09/21/2017 CLINICAL DATA:  Small bowel obstruction.  Repositioning of NG tube. EXAM: ABDOMEN - 1 VIEW FLUOROSCOPY TIME:  30 seconds COMPARISON:  Abdominal radiographs dated 09/21/2017 at 7:43 a.m. FINDINGS: NG tube was advanced under direct fluoroscopic visualization. The tip of the tube is now in the distal stomach. The stomach is decompressed. IMPRESSION: NG tube advanced into the distal stomach. The stomach is decompressed. Electronically Signed   By: Lorriane Shire M.D.   On:  09/21/2017 10:22   Ct Abdomen Pelvis W Contrast  Result Date: 09/16/2017 CLINICAL DATA:  Bowel obstruction EXAM: CT ABDOMEN AND PELVIS WITH CONTRAST TECHNIQUE: Multidetector CT imaging of the abdomen and pelvis was performed using the standard protocol following bolus administration of intravenous contrast. CONTRAST:  144mL ISOVUE-300 IOPAMIDOL (ISOVUE-300) INJECTION 61% COMPARISON:  09/16/2017, 09/04/2017, 05/31/2017 FINDINGS: Lower chest: Lung bases demonstrate no acute consolidation or pleural effusion. Heart size upper normal. Atherosclerotic calcifications. Hepatobiliary: Surgical absence of gallbladder. Mild intra and extrahepatic biliary enlargement similar compared to prior. Pancreas: Atrophic.  No inflammation Spleen: Normal in size without focal abnormality. Adrenals/Urinary Tract: Adrenal glands are  within normal limits. Multifocal cortical scarring in the kidneys. No hydronephrosis. The bladder is within normal limits Stomach/Bowel: Esophageal tube is present, the tip terminates along the wall of the proximal stomach. Multiple loops of markedly dilated, fluid-filled small bowel, measuring up to 6 cm in diameter. Transition point visualized in the right pelvis, series 3, image number 69 and is related to a disease segment of terminal ileum which appears very thickened. The colon is collapsed. Re- demonstrated rectangular 17 mm foreign object in the terminal ileum. Vascular/Lymphatic: Aortic atherosclerosis. No enlarged abdominal or pelvic lymph nodes. Reproductive: Uterus and bilateral adnexa are unremarkable. Other: Negative for free air. Tiny amount of free fluid in the pelvis. Re- demonstrated mild nodularity of the omentum most notable in the right upper quadrant. Musculoskeletal: Degenerative changes of the spine. No acute or suspicious bone lesion. IMPRESSION: 1. Findings consistent with high-grade mechanical small bowel obstruction. Transition point visualized in the right lower quadrant/pelvis  and is related to a diseased segment of terminal ileum which demonstrates significant wall thickening and possible stricturing. Negative for free air. 2. Re- demonstrated 17 mm rectangular foreign object in the terminal ileum. 3. Mild nodularity of the omentum re- demonstrated, correlation with tumor markers previously recommended. Electronically Signed   By: Donavan Foil M.D.   On: 09/16/2017 22:14   Ct Abd Limited W/o Cm  Result Date: 09/19/2017 CLINICAL DATA:  83 year old female with a history of Crohn's disease, with CT dated 05/31/2017 showing bowel obstruction. Follow-up CT imaging September 04, 2017 again shows evidence of Crohn disease, with new omental nodularity. Patient was referred as outpatient for CT-guided omental biopsy. In the interval the patient was admitted with recurrent bowel obstruction, 09/16/2017. Patient presents as inpatient for attempted omental biopsy. EXAM: CT ABDOMEN WITHOUT CONTRAST LIMITED TECHNIQUE: Multidetector CT imaging of the abdomen was performed following the standard protocol without IV contrast. COMPARISON:  Multiple prior, most recent 09/16/2017, 09/04/2017 FINDINGS: Limited CT of the abdomen was performed for planning purposes for a scheduled CT-guided omental biopsy. Initial image demonstrates calcific density in the distal small bowel, unchanged in position, uncertain etiology. Again, this does not appear to represent the site of the obstruction, in this patient with changes of Crohn's disease and likely strictures. The omental nodularity is not as well-defined on the current CT study. The lower abdominal nodularity is mass thigh reactive ascitic fluid, with no safe target identified during the targeting phase of the procedure. There are small nodules in the upper abdomen adjacent to the distal stomach that were are also targeted, without successful identification given respiratory motion and the size of the nodules, which are smaller than the slice thicknesses of  the planning CT. Given that there was no safe site for biopsy, biopsy deferred at this time. Body wall and omental anasarca/edema. Reactive ascitic fluid within small bowel loops, particularly at the right lower quadrant at the site of the previously suggested biopsy target. Evidence of continued bowel obstruction. IMPRESSION: Limited noncontrast CT during planning phase for attempted omental nodule CT-guided biopsy demonstrates there is no safe target on today's study, with the most reasonable site for biopsy masked by edema and ascitic fluid. Biopsy was deferred at this time. Images also demonstrate persistent small bowel obstruction, with unchanged position of the dense object at the ileocecal valve. Signed, Dulcy Fanny. Earleen Newport, DO [facility] Radiology PLAN: Would suggest another attempt at omental guided biopsy once the patient's condition improves, of small bowel obstruction and reactive changes/ascites resolves. Either  CT-guided biopsy or repeat diagnostic CT scan may be considered. Electronically Signed   By: Corrie Mckusick D.O.   On: 09/19/2017 10:28   Ct Entero Abd/pelvis W Contast  Result Date: 09/04/2017 CLINICAL DATA:  83 year old female with history of mid abdominal pain for the past 4 months. History of Crohn's disease. EXAM: CT ABDOMEN AND PELVIS WITH CONTRAST (ENTEROGRAPHY) TECHNIQUE: Multidetector CT of the abdomen and pelvis during bolus administration of intravenous contrast. Negative oral contrast was given. CONTRAST:  127mL ISOVUE-300 IOPAMIDOL (ISOVUE-300) INJECTION 61% COMPARISON:  CT the abdomen and pelvis 05/31/2017. FINDINGS: Lower chest: Calcifications of the aortic valve. Mild cardiomegaly. Hepatobiliary: No cystic or solid hepatic lesions. No intra or extrahepatic biliary ductal dilatation. Gallbladder is not visualized and presumably surgically absent (no surgical clips are noted in the gallbladder fossa). Pancreas: No pancreatic mass.  No pancreatic ductal dilatation. No pancreatic or peripancreatic fluid or inflammatory changes. Spleen: Unremarkable. Adrenals/Urinary Tract: Multifocal cortical thinning in the kidneys bilaterally. No suspicious renal lesions. No hydroureteronephrosis. Urinary bladder is normal in appearance. Bilateral adrenal glands nor appearance. Stomach/Bowel: The appearance of the stomach is normal. There is no pathologic dilatation of small bowel or colon. Enterography images demonstrate profound mural thickening of the distal and terminal ileum, with hyperenhancement of the associated mucosa. No overt surrounding inflammatory changes are noted at this time. No other focal areas of mural thickening are noted in the small bowel or colon. In the lumen of the terminal ileum shortly before the ileocecal valve there is again a dense cylindrical shaped foreign body measuring 16 x 13 x 13 mm. Normal appendix. Vascular/Lymphatic: Aortic atherosclerosis, without evidence of aneurysm or dissection in the abdominal or pelvic vasculature. No lymphadenopathy noted in the abdomen or pelvis. Reproductive: Uterus and ovaries are unremarkable in appearance. Other: Multiple areas of apparent soft tissue nodularity throughout the omentum, measuring up to 9 mm in diameter (axial image 77 of series 3). No significant volume of ascites. No pneumoperitoneum. Mild diffuse body wall edema. Musculoskeletal: There are no aggressive appearing lytic or blastic lesions noted in the visualized portions of the skeleton. IMPRESSION: 1. Extensive mural thickening and mucosal hyperenhancement throughout the distal and terminal ileum, compatible with the reported clinical history of Crohn's disease. At this time, there are no overt surrounding inflammatory changes to suggest Crohn's exacerbation. 2. Unusual soft tissue nodularity noted throughout the omentum. This is of uncertain etiology and significance, however, this is most commonly seen in the setting of  intraperitoneal malignancy. Further clinical evaluation and correlation with tumor markers is suggested. 3. Previously noted intraluminal form body remains in position in the terminal ileum. This is 8 dense cylindrical object measuring 16 x 13 x 13 mm. 4. Aortic atherosclerosis. 5. There are calcifications of the aortic valve. Echocardiographic correlation for evaluation of potential valvular dysfunction may be warranted if clinically indicated. 6. Additional incidental findings, as above. Aortic Atherosclerosis (ICD10-I70.0). Electronically Signed   By: Vinnie Langton M.D.   On: 09/04/2017 10:43   Dg Abdomen Acute W/chest  Result Date: 09/16/2017 CLINICAL DATA:  Initial evaluation for acute vomiting, abdominal pain. EXAM: DG ABDOMEN ACUTE W/ 1V CHEST COMPARISON:  Prior CT from 09/04/2017. FINDINGS: Cardiac and mediastinal silhouettes within normal limits. Lungs hypoinflated. Mild bibasilar atelectasis. No focal infiltrates. No pulmonary edema or pleural effusion. No pneumothorax. Multiple dilated gas-filled loops of small bowel seen within the upper mid and left abdomen. These measure up to 5.2 cm. Associated air-fluid levels. Findings consistent with small bowel obstruction. No soft tissue  mass or abnormal calcification. No free air. IMPRESSION: 1. Multiple dilated gas-filled loops of small bowel with associated air-fluid levels, consistent with small bowel obstruction. 2. Shallow lung inflation with mild bibasilar atelectasis. Electronically Signed   By: Jeannine Boga M.D.   On: 09/16/2017 19:16   Dg Abd Portable 1v  Result Date: 09/21/2017 CLINICAL DATA:  Follow-up ileus. EXAM: PORTABLE ABDOMEN - 1 VIEW COMPARISON:  09/20/2017 FINDINGS: Nasogastric tube is in place, tip overlying the very proximal aspect of the stomach. There are significantly dilated central small bowel loops, measuring up to 5.6 cm. Gas is identified within nondilated loops of large bowel. No evidence for free intraperitoneal  air. IMPRESSION: Findings consistent with persistent high-grade small bowel obstruction. Electronically Signed   By: Nolon Nations M.D.   On: 09/21/2017 08:18   Dg Abd Portable 1v  Result Date: 09/20/2017 CLINICAL DATA:  83 year old female with small bowel obstruction. EXAM: PORTABLE ABDOMEN - 1 VIEW COMPARISON:  09/18/2017 FINDINGS: Moderately distended loops of gas-filled small bowel are again seen throughout the mid abdomen. Air is identified within the colon and rectum. Please note, evaluation for free intraperitoneal air is limited on single supine radiograph. No significant osseous abnormalities. IMPRESSION: 1. Persistent moderate distention of gas-filled small bowel loops within the mid abdomen. 2. Air is now identified within the colon and rectum. Electronically Signed   By: Kristopher Oppenheim M.D.   On: 09/20/2017 08:12   Dg Abd Portable 1v  Result Date: 09/18/2017 CLINICAL DATA:  Abdominal pain, small bowel obstruction. EXAM: PORTABLE ABDOMEN - 1 VIEW COMPARISON:  Abdominal CT scan of September 16, 2017 and abdominal series of the same day. FINDINGS: There remain loops of moderately distended gas-filled small bowel in the midline. The uric free of gaseous distention of portions of the jejunum reaches 8.5 cm. No significant gas or stool is observed in the colon. The stomach is nondistended. The esophagogastric tube tip in proximal port project in the gastric cardia. There is contrast material within the urinary bladder. IMPRESSION: Persistent mid to distal small bowel obstruction with significant gaseous distention of small-bowel loops. No evidence of perforation currently. Electronically Signed   By: David  Martinique M.D.   On: 09/18/2017 10:44    Lab Data:  CBC: Recent Labs  Lab 09/16/17 1507 09/17/17 0711 09/18/17 0849 09/19/17 0324 09/21/17 0353  WBC 12.3* 9.3 12.0* 11.1* 11.8*  HGB 12.6 10.9* 11.2* 10.7* 10.4*  HCT 39.0 33.9* 35.4* 34.1* 33.0*  MCV 87.1 87.6 88.9 88.6 90.4  PLT 279  209 254 246 542   Basic Metabolic Panel: Recent Labs  Lab 09/16/17 1507 09/17/17 0711 09/18/17 0849 09/19/17 0324 09/21/17 0353  NA 131* 134* 137 138 140  K 4.4 4.3 4.2 4.5 4.4  CL 97* 101 106 108 108  CO2 23 24 22 23 23   GLUCOSE 139* 130* 90 86 89  BUN 11 12 16 16 16   CREATININE 1.24* 1.20* 1.20* 1.14* 1.11*  CALCIUM 8.6* 7.8* 7.7* 7.6* 7.9*   GFR: Estimated Creatinine Clearance: 37.5 mL/min (A) (by C-G formula based on SCr of 1.11 mg/dL (H)). Liver Function Tests: Recent Labs  Lab 09/16/17 1507 09/21/17 0353  AST 13* 16  ALT 8* 11*  ALKPHOS 65 45  BILITOT 0.5 0.5  PROT 6.6 4.9*  ALBUMIN 3.0* 2.5*   Recent Labs  Lab 09/16/17 1507  LIPASE 17   No results for input(s): AMMONIA in the last 168 hours. Coagulation Profile: Recent Labs  Lab 09/18/17 0558  INR 1.19  Cardiac Enzymes: No results for input(s): CKTOTAL, CKMB, CKMBINDEX, TROPONINI in the last 168 hours. BNP (last 3 results) No results for input(s): PROBNP in the last 8760 hours. HbA1C: No results for input(s): HGBA1C in the last 72 hours. CBG: No results for input(s): GLUCAP in the last 168 hours. Lipid Profile: No results for input(s): CHOL, HDL, LDLCALC, TRIG, CHOLHDL, LDLDIRECT in the last 72 hours. Thyroid Function Tests: No results for input(s): TSH, T4TOTAL, FREET4, T3FREE, THYROIDAB in the last 72 hours. Anemia Panel: No results for input(s): VITAMINB12, FOLATE, FERRITIN, TIBC, IRON, RETICCTPCT in the last 72 hours. Urine analysis:    Component Value Date/Time   COLORURINE YELLOW 09/17/2017 0425   APPEARANCEUR CLEAR 09/17/2017 0425   LABSPEC 1.044 (H) 09/17/2017 0425   PHURINE 5.0 09/17/2017 0425   GLUCOSEU NEGATIVE 09/17/2017 0425   HGBUR NEGATIVE 09/17/2017 0425   BILIRUBINUR NEGATIVE 09/17/2017 0425   KETONESUR NEGATIVE 09/17/2017 0425   PROTEINUR NEGATIVE 09/17/2017 0425   NITRITE NEGATIVE 09/17/2017 0425   LEUKOCYTESUR NEGATIVE 09/17/2017 0425     Terri [name] M.D. [facility] 09/21/2017, 12:44 PM  Pager: [phone] Between 7am to 7pm - call Pager - [phone]  After 7pm go to [email or web address] - password [email or web address]  Call night coverage person covering after 7pm

## 2017-09-21 NOTE — Progress Notes (Signed)
[place] NOTE   Pharmacy Consult for TPN Indication: prolonged ileus  Patient Measurements: Height: 5' 2"  (157.5 cm) Weight: 160 lb (72.6 kg) IBW/kg (Calculated) : 50.1 TPN AdjBW (KG): 55.7 Body mass index is 29.26 kg/m.  Assessment:  89 yof with recent hx of SBO in July secondary to Crohn's disease admitted. SBO resolved / improved after NGT and steroids, though has had some abdominal sxs in the intervening time.Patient admitted with N/V, abd distention, found to have another SBO. CT scan on 10/23 showed findings suggestive of Crohns, omental nodularity worrisome for malignancy (unable to have biopsy at [facility] this week due to inflammation/SBO), and foreign body in terminal ilium, possibly a tablet present since CT scan in July.  GI: Crohn's dz, high-grade SBO, ommental nodularity. Foreign body in small intestine. Pre-albumin 11.3. Abdomen distension ongoing. +flatus, last BM 11/9. IR to replace NGT. GI considering trial of Remicade vs. Surgery. Folic acid, PPI Endo: No DM hx - BG controlled on no meds. Noted on Solu-medrol reduced to 46m daily. Synthroid Insulin requirements in the past 24 hours: none Lytes:  All WNL, no phos/mag available yet Renal: SCr 1.11 - relatively stable, CrCl~35, UOP 0.2 per documentation. lasix x 1 on 11/9. NS at 50 ml/hr Pulm: RA > [place] Cards: BP slightly high, HR 100s - lopressor IV Hepatobil: LFTs/Tbili/Alk phos WNL Neuro: morph prn ID: Previously completed [id number] INH for prior +TB test. ID felt no further tx necessary. WBC 11.8 stable, Afebrile - no abx  Best Practices: enox TPN Access: PICC for 09/21/17 pending TPN start date: 09/21/17  Nutritional Goals (per RD recommendation on *pending*): KCal:  1390-1670 kcal Protein:  67-84g  Current Nutrition:  NPO  Plan:  Initiate TPN at 30 ml/hr tonight at 1800 TPN provides 34g of protein, 22g lipids, and 108g of dextrose, providing 727 total kcal, which meets ~48%  of patient needs Decrease NS to 20 ml/hr at 1800 when TPN initiated Electrolytes in TPN:  standard. Cl:Ac ratio 1:1 Add MVI and trace elements in TPN Add Folic Acid 44m23mn TPN Add moderate SSI q6h and adjust as needed Monitor TPN labs in the AM, clinical progress F/U Surgery plans   HalElicia LampharmD, BCPS Clinical Pharmacist Rx Phone # for today: #[phone] 3:30PM, please call Main Rx: #[phone]/07/2017 9:25 AM

## 2017-09-21 NOTE — Progress Notes (Addendum)
Initial Nutrition Assessment  DOCUMENTATION CODES:   Not applicable  INTERVENTION:   -TPN management per pharmacy -RD will monitor for diet advancement and supplement as appropriate  NUTRITION DIAGNOSIS:   Inadequate oral intake related to altered GI function as evidenced by NPO status.  GOAL:   Patient will meet greater than or equal to 90% of their needs  MONITOR:   Diet advancement, Labs, Weight trends, Skin, I & O's  REASON FOR ASSESSMENT:   Consult New TPN/TNA  ASSESSMENT:   Terri [name] is a 83 y.o. female with medical history significant of Crohn's disease diagnosed x5 years ago.  SBO in July this year felt to be secondary to Crohn's disease.  SBO resolved / improved after NGT and steroids though she has been having some abdominal symptoms in the intervening time.  Patient presents to the ED with c/o nausea, abd distention, concern for another SBO.  Pt admitted with SBO secondary to crohn's disease.   11/6- s/p omental mass biopsy with IR 11/8- per GI notes, KUB reveals persistent dilation of small bowel loops with gas in colon  Case discussed with RN prior to visit; confirms plan for TPN and PICC placement today. Pt recently returned from radiology for NGT adjustment. Noted 60 ml NGT output over the past 24 hours.   Per pharmacy note, plan to initiate TPN at 30 ml/hr at 180-0. This provides 727 kcals and 34 grams protein, which meets 45% of estimated kcals and 43% of estimated protein needs.  Spoke with pt, who reports poor oral intake over the past 2 weeks PTA. She estimates she was consuming 2 meals per day and was having difficulty tolerating high fiber foods (Breakfast: boiled eggs and toast, Dinner: meat and potato). Pt reports that she has been focusing on consuming good protein sources. Of note, pt shares that she was hospitalized for similar complaints in July 2018 and lost weight related to acute hospitalization (approximately 15#), however, has been  maintaining weight since that time. She shares UBW is around 180#.  Pt verbalized understanding for need of TPN. Discussed how pt will receive nutrition until allowed to take PO's.    Medications reviewed and include IV solumedrol and 0-15 units insulin aspart every 6 hours.   Labs reviewed.  NUTRITION - FOCUSED PHYSICAL EXAM:    Most Recent Value  Orbital Region  No depletion  Upper Arm Region  Mild depletion  Thoracic and Lumbar Region  No depletion  Buccal Region  Unable to assess  Temple Region  No depletion  Clavicle Bone Region  No depletion  Clavicle and Acromion Bone Region  No depletion  Scapular Bone Region  No depletion  Dorsal Hand  No depletion  Patellar Region  No depletion  Anterior Thigh Region  No depletion  Posterior Calf Region  No depletion  Edema (RD Assessment)  Mild  Hair  Reviewed  Eyes  Reviewed  Mouth  Reviewed  Skin  Reviewed  Nails  Reviewed       Diet Order:  Diet NPO time specified Except for: Sips with Meds TPN ADULT (ION)  EDUCATION NEEDS:   Education needs have been addressed  Skin:  Skin Assessment: Reviewed RN Assessment  Last BM:  09/20/17  Height:   Ht Readings from Last 1 Encounters:  09/16/17 5\' 2"  (1.575 m)    Weight:   Wt Readings from Last 1 Encounters:  09/21/17 181 lb 3.5 oz (82.2 kg)    Ideal Body Weight:  50 kg  BMI:  Body mass index is 33.15 kg/m.  Estimated Nutritional Needs:   Kcal:  1600-1800  Protein:  80-95 grams  Fluid:  1.6-1.8 L    Terri [name] A. Terri [name], RD, LDN, CDE Pager: [phone] After hours Pager: [phone]

## 2017-09-21 NOTE — Progress Notes (Signed)
Peripherally Inserted Central Catheter/Midline Placement  The IV Nurse has discussed with the patient and/or persons authorized to consent for the patient, the purpose of this procedure and the potential benefits and risks involved with this procedure.  The benefits include less needle sticks, lab draws from the catheter, and the patient may be discharged home with the catheter. Risks include, but not limited to, infection, bleeding, blood clot (thrombus formation), and puncture of an artery; nerve damage and irregular heartbeat and possibility to perform a PICC exchange if needed/ordered by physician.  Alternatives to this procedure were also discussed.  Bard Power PICC patient education guide, fact sheet on infection prevention and patient information card has been provided to patient /or left at bedside.    PICC/Midline Placement Documentation  PICC Double Lumen 09/21/17 PICC Right Brachial 39 cm 0 cm (Active)  Indication for Insertion or Continuance of Line Administration of hyperosmolar/irritating solutions (i.e. TPN, Vancomycin, etc.) 09/21/2017 12:51 PM  Exposed Catheter (cm) 0 cm 09/21/2017 12:51 PM  Site Assessment Clean;Dry;Intact 09/21/2017 12:51 PM  Lumen #1 Status Flushed;Saline locked;Blood return noted 09/21/2017 12:51 PM  Lumen #2 Status Flushed;Saline locked;Blood return noted 09/21/2017 12:51 PM  Dressing Type Transparent;Securing device 09/21/2017 12:51 PM  Dressing Status Clean;Dry;Intact;Antimicrobial disc in place 09/21/2017 12:51 PM  Dressing Change Due 09/28/17 09/21/2017 12:51 PM       Frances Maywood 09/21/2017, 12:54 PM

## 2017-09-21 NOTE — Progress Notes (Signed)
NG repositioned. Pt now on TNA. KUB shows persistent SBO.  Pt's son (pediatrician) at bedside.  Lengthy discussion regarding pro's and con's of surgery.  We will re-evaluate on Monday, but Dr. Watt Climes, the pt's son, and I all feel that surgery is probably the best option.  Given modest response to high-dose steroids, there is probably a large element of fixed stenosis that would not be responsive to biologics, not to mention h/o +PPD and question of intra-abd malignancy.  We will see pt periodically over the next several days--please call us at any time if more immediate input is needed.  Time at bedside 25'.  Cleotis Nipper, M.D. Pager [phone] If no answer or after 5 PM call [phone]

## 2017-09-21 NOTE — Progress Notes (Signed)
    CC:  SBO  Subjective: Her NG is barely in if it is in.  Bowel is still distended, she does not feel any better, no pain, but ongoing distension.     Objective: Vital signs in last 24 hours: Temp:  [97.7 F (36.5 C)-98.3 F (36.8 C)] 98 F (36.7 C) (11/09 0700) Pulse Rate:  [73-112] 112 (11/09 0506) Resp:  [19-20] 20 (11/09 0506) BP: (114-147)/(65-95) 147/95 (11/09 0700) SpO2:  [95 %-98 %] 98 % (11/09 0700) Last BM Date: 09/20/17 NPO 1375 IV 400 urine NG 60 Stool x 2 Afebrile, VSS CMP stable, prealbumin is 11.3 Other labs are stable Film this AM shows ongoing high grade SBO   Intake/Output from previous day: 11/08 0701 - 11/09 0700 In: 5573 [I.V.:1375; NG/GT:10] Out: 463 [Urine:401; Emesis/NG output:60; Stool:2] Intake/Output this shift: No intake/output data recorded.  General appearance: alert, cooperative, no distress and worn out and tired.   Resp: she has some wheezing this AM GI: distended, BS hypoactive, no real pain, she had a couple small BM.  Lab Results:  Recent Labs    09/19/17 0324 09/21/17 0353  WBC 11.1* 11.8*  HGB 10.7* 10.4*  HCT 34.1* 33.0*  PLT 246 216    BMET Recent Labs    09/19/17 0324 09/21/17 0353  NA 138 140  K 4.5 4.4  CL 108 108  CO2 23 23  GLUCOSE 86 89  BUN 16 16  CREATININE 1.14* 1.11*  CALCIUM 7.6* 7.9*   PT/INR No results for input(s): LABPROT, INR in the last 72 hours.  Recent Labs  Lab 09/16/17 1507 09/21/17 0353  AST 13* 16  ALT 8* 11*  ALKPHOS 65 45  BILITOT 0.5 0.5  PROT 6.6 4.9*  ALBUMIN 3.0* 2.5*     Lipase     Component Value Date/Time   LIPASE 17 09/16/2017 1507     Medications: . enoxaparin (LOVENOX) injection  40 mg Subcutaneous Q24H  . folic acid  1 mg Oral Daily  . levothyroxine  50 mcg Intravenous Daily  . mouth rinse  15 mL Mouth Rinse BID  . methylPREDNISolone (SOLU-MEDROL) injection  60 mg Intravenous Daily  . metoprolol tartrate  2.5 mg Intravenous Q6H  . pantoprazole  (PROTONIX) IV  40 mg Intravenous Q24H    Assessment/Plan Recurrent small bowel obstruction Crohn's disease with inflammation of the distal terminal ileum/foreign object distal terminal ileum. Medical management currently - Methylprednisolone Abnormal omental pattern on CT/biopsycould not be done secondary to SBO and edema BX @[facility]  09/19/17 Malnutrition - Prealbumin 11.3:  PICC line/TNA starting today per Dr. Tana Coast ? Fluid overload - CXR with PICC; Dr. Tana Coast adjusting fluids History of GERD/duodenal ulcers- Protonix Mild Renal insuffiencey- creatinine 1.24 - 1.11 stable Reported mild aortic stenosis. Hypertension Hypothyroid-synthroid Remote history of rheumatic heart disease.  History of tobacco use History of TIA.  Fen: N.p.o./IV fluids ID: None DVT: SCD/Lovenox  Plan:  I do not see allot of progression.  SB loops at 5.6 Cm.  Discussed with Dr Tana Coast.  We are placing PICC line, and starting TNA.  Ask IR to replace NG.  Will review with Dr. Marlou Starks.  She will get a cxr with PICC line insertion        LOS: 5 days    Domnique Vantine 09/21/2017 [phone]

## 2017-09-22 DIAGNOSIS — Z4659 Encounter for fitting and adjustment of other gastrointestinal appliance and device: Secondary | ICD-10-CM

## 2017-09-22 LAB — DIFFERENTIAL
Basophils Absolute: 0 10*3/uL (ref 0.0–0.1)
Basophils Relative: 0 %
EOS PCT: 0 %
Eosinophils Absolute: 0 10*3/uL (ref 0.0–0.7)
Lymphocytes Relative: 22 %
Lymphs Abs: 2.3 10*3/uL (ref 0.7–4.0)
MONO ABS: 0.9 10*3/uL (ref 0.1–1.0)
MONOS PCT: 9 %
NEUTROS PCT: 69 %
Neutro Abs: 7.4 10*3/uL (ref 1.7–7.7)

## 2017-09-22 LAB — COMPREHENSIVE METABOLIC PANEL
ALK PHOS: 45 U/L (ref 38–126)
ALT: 11 U/L — ABNORMAL LOW (ref 14–54)
ANION GAP: 6 (ref 5–15)
AST: 15 U/L (ref 15–41)
Albumin: 2.5 g/dL — ABNORMAL LOW (ref 3.5–5.0)
BUN: 19 mg/dL (ref 6–20)
CALCIUM: 8.1 mg/dL — AB (ref 8.9–10.3)
CO2: 26 mmol/L (ref 22–32)
Chloride: 106 mmol/L (ref 101–111)
Creatinine, Ser: 1.01 mg/dL — ABNORMAL HIGH (ref 0.44–1.00)
GFR calc non Af Amer: 50 mL/min — ABNORMAL LOW (ref >60)
GFR, EST AFRICAN AMERICAN: 58 mL/min — AB (ref >60)
Glucose, Bld: 139 mg/dL — ABNORMAL HIGH (ref 65–99)
Potassium: 3.9 mmol/L (ref 3.5–5.1)
SODIUM: 138 mmol/L (ref 135–145)
TOTAL PROTEIN: 5 g/dL — AB (ref 6.5–8.1)
Total Bilirubin: 0.4 mg/dL (ref 0.3–1.2)

## 2017-09-22 LAB — CBC
HCT: 31.9 % — ABNORMAL LOW (ref 36.0–46.0)
HEMOGLOBIN: 10.1 g/dL — AB (ref 12.0–15.0)
MCH: 28.1 pg (ref 26.0–34.0)
MCHC: 31.7 g/dL (ref 30.0–36.0)
MCV: 88.9 fL (ref 78.0–100.0)
Platelets: 181 10*3/uL (ref 150–400)
RBC: 3.59 MIL/uL — AB (ref 3.87–5.11)
RDW: 14.4 % (ref 11.5–15.5)
WBC: 10.6 10*3/uL — ABNORMAL HIGH (ref 4.0–10.5)

## 2017-09-22 LAB — GLUCOSE, CAPILLARY
GLUCOSE-CAPILLARY: 128 mg/dL — AB (ref 65–99)
GLUCOSE-CAPILLARY: 154 mg/dL — AB (ref 65–99)
GLUCOSE-CAPILLARY: 154 mg/dL — AB (ref 65–99)
Glucose-Capillary: 151 mg/dL — ABNORMAL HIGH (ref 65–99)

## 2017-09-22 LAB — PHOSPHORUS: PHOSPHORUS: 3.2 mg/dL (ref 2.5–4.6)

## 2017-09-22 LAB — TRIGLYCERIDES: TRIGLYCERIDES: 106 mg/dL (ref <150)

## 2017-09-22 LAB — MAGNESIUM: MAGNESIUM: 1.3 mg/dL — AB (ref 1.7–2.4)

## 2017-09-22 LAB — PREALBUMIN: PREALBUMIN: 11.7 mg/dL — AB (ref 18–38)

## 2017-09-22 MED ORDER — BISACODYL 10 MG RE SUPP
10.0000 mg | Freq: Once | RECTAL | Status: AC
  Administered 2017-09-22: 10 mg via RECTAL
  Filled 2017-09-22: qty 1

## 2017-09-22 MED ORDER — TRAVASOL 10 % IV SOLN
INTRAVENOUS | Status: AC
  Administered 2017-09-22: 19:00:00 via INTRAVENOUS
  Filled 2017-09-22: qty 904.8

## 2017-09-22 MED ORDER — MAGNESIUM SULFATE 4 GM/100ML IV SOLN
4.0000 g | Freq: Once | INTRAVENOUS | Status: AC
  Administered 2017-09-22: 4 g via INTRAVENOUS
  Filled 2017-09-22: qty 100

## 2017-09-22 NOTE — Progress Notes (Signed)
SBO; TI Crohn's stricture; SBO 05/2017; readmitted with the same.  Subjective No acute events. NG in place, no n/v. +flatus, no bm.  Objective: Vital signs in last 24 hours: Temp:  [97.6 F (36.4 C)-98.5 F (36.9 C)] 98.5 F (36.9 C) (11/10 0625) Pulse Rate:  [112] 112 (11/10 0625) Resp:  [19-20] 20 (11/10 0625) BP: (120-139)/(76-92) 120/78 (11/10 0625) SpO2:  [95 %-97 %] 97 % (11/10 0625) Weight:  [82.1 kg (181 lb)-82.4 kg (181 lb 11.2 oz)] 82.4 kg (181 lb 11.2 oz) (11/10 0700) Last BM Date: 09/20/17  Intake/Output from previous day: 11/09 0701 - 11/10 0700 In: 20 [I.V.:20] Out: 2200 [Urine:2200] Intake/Output this shift: Total I/O In: -  Out: 50 [Emesis/NG output:50]  Gen: NAD, comfortable CV: RRR Pulm: Normal work of breathing Abd: Soft, NT; no r/g Ext: SCDs in place  Lab Results: CBC  Recent Labs    09/21/17 0353 09/22/17 0452  WBC 11.8* 10.6*  HGB 10.4* 10.1*  HCT 33.0* 31.9*  PLT 216 181   BMET Recent Labs    09/21/17 0353 09/22/17 0452  NA 140 138  K 4.4 3.9  CL 108 106  CO2 23 26  GLUCOSE 89 139*  BUN 16 19  CREATININE 1.11* 1.01*  CALCIUM 7.9* 8.1*   PT/INR No results for input(s): LABPROT, INR in the last 72 hours. ABG No results for input(s): PHART, HCO3 in the last 72 hours.  Invalid input(s): PCO2, PO2  Studies/Results:  Anti-infectives: Anti-infectives (From admission, onward)   None       Assessment/Plan: Patient Active Problem List   Diagnosis Date Noted  . Foreign body of small intestine, subsequent encounter 09/16/2017  . Abnormal CT of the abdomen 09/16/2017  . SBO (small bowel obstruction) ([place]) 05/31/2017  . Crohn's disease ([place]) 05/31/2017  . Hx-TIA (transient ischemic attack) 05/31/2017  . HOH (hard of hearing) 05/31/2017  . AKI (acute kidney injury) ([place]) 05/31/2017  . Leukocytosis 05/31/2017  . Bilious vomiting with nausea   . Crohn's ileitis, with intestinal obstruction ([place])   . Dehydration    Probable  Crohn's stricture of TI; readmitted with SBO; Alb 2.5, prealbumin 12 -Continue NPO/NGT -TPN -Continue to monitor; anticipate OR this admission if no significant progress   LOS: 6 days   Sharon Mt. Dema Severin, M.D. General and Colorectal Surgery [facility]

## 2017-09-22 NOTE — Progress Notes (Signed)
[facility]                                                                              Patient Demographics  Terri [name], is a 83 y.o. female, DOB - [birth year], OIN:[id number]  Admit date - 09/16/2017   Admitting Physician Etta Quill, DO  Outpatient Primary MD for the patient is [name], [name], MD  Outpatient specialists:   LOS - 6  days   Medical records reviewed and are as summarized below:    Chief Complaint  Patient presents with  . Abdominal Pain  . Nausea  . Emesis       Brief summary   HPI on 09/16/2017 by Dr. Jolene Schimke Braughtonis a 83 y.o.femalewith medical history significant ofCrohn's disease diagnosed x5 years ago. SBO in July this year felt to be secondary to Crohn's disease. SBO resolved / improved after NGT and steroids though she has been having some abdominal symptoms in the intervening time. Patient presents to the ED with c/o nausea, abd distention, concern for another SBO. Of note patient just had CT scan on 10/23 which showed findings suggestive of Crohns, omental nodularity worrisome for malignancy (patient had biopsy scheduled at [facility] on Wed this week), and foreign body in terminal ilium, possibly a pill or tablet that has been present since CT scan in July of this year     Assessment & Plan    Principal Problem:   SBO (small bowel obstruction) (HCC) -No significant improvement, still high-grade SBO -CT abdomen showed high-grade mechanical SBO with transition point in the right lower quadrant/pelvis and related to disease segment of terminal ileum demonstrating wall thickening and possible stricturing -General surgery, GI consulted.  Continue conservative management with IV fluids, antiemetics, pain control -Continue NPO/NGT, replaced by IR.   - PICC line placed, started TNA  Active Problems: Omental nodularity - IR was consulted, discussed with Dr. Tresea Mall, unable to do-the omental biopsy due to  significant reactive inflammation and SBO.  Recommended outpatient biopsy after the acute hospitalization is resolved    Crohn's disease ([place]) -GI consulted, ID was consulted by GI for possible Remicade use.  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  Patient had completed 6 months of INH -  ID felt no other therapy indicated prior to considering of biological agents.    Foreign body of small intestine, subsequent encounter -Noted on CT abdomen pelvis 17 mm firm object in the terminal ileum General surgery following  Hypothyroidism Continue Synthroid  Dyspnea/wheezing Likely due to fluid overload, I/O's with +5.9 L. Feels better today, KVO fluids   Code Status: Full CODE STATUS DVT Prophylaxis:  Lovenox  Family Communication: Discussed in detail with the patient, all imaging results, lab results explained to the patient    Disposition Plan:  Time Spent in minutes  25 minutes  Procedures:    Consultants:   General surgery GI IR  Antimicrobials:      Medications  Scheduled Meds: . enoxaparin (LOVENOX) injection  40 mg Subcutaneous Q24H  . insulin aspart  0-15 Units Subcutaneous Q6H  . levothyroxine  50 mcg Intravenous Daily  . mouth rinse  15 mL Mouth  Rinse BID  . methylPREDNISolone (SOLU-MEDROL) injection  60 mg Intravenous Daily  . metoprolol tartrate  2.5 mg Intravenous Q6H  . pantoprazole (PROTONIX) IV  40 mg Intravenous Q24H   Continuous Infusions: . sodium chloride 20 mL/hr at 09/21/17 1743  . TPN ADULT (ION) 30 mL/hr at 09/21/17 1733  . TPN ADULT (ION)     PRN Meds:.acetaminophen **OR** acetaminophen, hydroxypropyl methylcellulose / hypromellose, iopamidol, morphine injection, ondansetron **OR** ondansetron (ZOFRAN) IV, sodium chloride flush   Antibiotics   Anti-infectives (From admission, onward)   None        Subjective:   Terri [name] was seen and examined today.  Passing flatus, no BM, shortness of breath is improving.  No fevers or chills.  No  abdominal pain.  Patient denies dizziness, chest pain, shortness of breath, new weakness, numbess, tingling. No acute events overnight.  Objective:   Vitals:   09/21/17 2148 09/22/17 0625 09/22/17 0645 09/22/17 1112  BP: (!) 139/92 120/78  126/64  Pulse: (!) 112 (!) 112  92  Resp: 19 20  20   Temp: 97.6 F (36.4 C) 98.5 F (36.9 C)  98.1 F (36.7 C)  TempSrc: Oral   Oral  SpO2: 95% 97%  98%  Weight:   82.1 kg (181 lb)   Height:        Intake/Output Summary (Last 24 hours) at 09/22/2017 1157 Last data filed at 09/22/2017 0716 Gross per 24 hour  Intake 20 ml  Output 1850 ml  Net -1830 ml     Wt Readings from Last 3 Encounters:  09/22/17 82.1 kg (181 lb)  09/22/17 82.4 kg (181 lb 11.2 oz)  09/19/17 72.6 kg (160 lb)     Exam   General: Alert and oriented x 3, NAD, NGT  Eyes:   HEENT:    Cardiovascular: S1 S2 clear, RRR, no pedal edema  Respiratory: Decreased breath sound at the bases, no wheezing  Gastrointestinal: Soft, nontender, nondistended, + bowel sounds  Ext: no pedal edema bilaterally  Neuro no new deficits  Musculoskeletal: No digital cyanosis, clubbing  Skin: No rashes  Psych: Normal affect and demeanor, alert and oriented x3    Data Reviewed:  I have personally reviewed following labs and imaging studies  Micro Results No results found for this or any previous visit (from the past 240 hour(s)).  Radiology Reports Dg Abd 1 View  Result Date: 09/21/2017 CLINICAL DATA:  Small bowel obstruction.  Repositioning of NG tube. EXAM: ABDOMEN - 1 VIEW FLUOROSCOPY TIME:  30 seconds COMPARISON:  Abdominal radiographs dated 09/21/2017 at 7:43 a.m. FINDINGS: NG tube was advanced under direct fluoroscopic visualization. The tip of the tube is now in the distal stomach. The stomach is decompressed. IMPRESSION: NG tube advanced into the distal stomach. The stomach is decompressed. Electronically Signed   By: Lorriane Shire M.D.   On: 09/21/2017 10:22   Ct  Abdomen Pelvis W Contrast  Result Date: 09/16/2017 CLINICAL DATA:  Bowel obstruction EXAM: CT ABDOMEN AND PELVIS WITH CONTRAST TECHNIQUE: Multidetector CT imaging of the abdomen and pelvis was performed using the standard protocol following bolus administration of intravenous contrast. CONTRAST:  163mL ISOVUE-300 IOPAMIDOL (ISOVUE-300) INJECTION 61% COMPARISON:  09/16/2017, 09/04/2017, 05/31/2017 FINDINGS: Lower chest: Lung bases demonstrate no acute consolidation or pleural effusion. Heart size upper normal. Atherosclerotic calcifications. Hepatobiliary: Surgical absence of gallbladder. Mild intra and extrahepatic biliary enlargement similar compared to prior. Pancreas: Atrophic.  No inflammation Spleen: Normal in size without focal abnormality. Adrenals/Urinary Tract: Adrenal glands are within  normal limits. Multifocal cortical scarring in the kidneys. No hydronephrosis. The bladder is within normal limits Stomach/Bowel: Esophageal tube is present, the tip terminates along the wall of the proximal stomach. Multiple loops of markedly dilated, fluid-filled small bowel, measuring up to 6 cm in diameter. Transition point visualized in the right pelvis, series 3, image number 69 and is related to a disease segment of terminal ileum which appears very thickened. The colon is collapsed. Re- demonstrated rectangular 17 mm foreign object in the terminal ileum. Vascular/Lymphatic: Aortic atherosclerosis. No enlarged abdominal or pelvic lymph nodes. Reproductive: Uterus and bilateral adnexa are unremarkable. Other: Negative for free air. Tiny amount of free fluid in the pelvis. Re- demonstrated mild nodularity of the omentum most notable in the right upper quadrant. Musculoskeletal: Degenerative changes of the spine. No acute or suspicious bone lesion. IMPRESSION: 1. Findings consistent with high-grade mechanical small bowel obstruction. Transition point visualized in the right lower quadrant/pelvis and is related to a  diseased segment of terminal ileum which demonstrates significant wall thickening and possible stricturing. Negative for free air. 2. Re- demonstrated 17 mm rectangular foreign object in the terminal ileum. 3. Mild nodularity of the omentum re- demonstrated, correlation with tumor markers previously recommended. Electronically Signed   By: Donavan Foil M.D.   On: 09/16/2017 22:14   Ct Abd Limited W/o Cm  Result Date: 09/19/2017 CLINICAL DATA:  83 year old female with a history of Crohn's disease, with CT dated 05/31/2017 showing bowel obstruction. Follow-up CT imaging September 04, 2017 again shows evidence of Crohn disease, with new omental nodularity. Patient was referred as outpatient for CT-guided omental biopsy. In the interval the patient was admitted with recurrent bowel obstruction, 09/16/2017. Patient presents as inpatient for attempted omental biopsy. EXAM: CT ABDOMEN WITHOUT CONTRAST LIMITED TECHNIQUE: Multidetector CT imaging of the abdomen was performed following the standard protocol without IV contrast. COMPARISON:  Multiple prior, most recent 09/16/2017, 09/04/2017 FINDINGS: Limited CT of the abdomen was performed for planning purposes for a scheduled CT-guided omental biopsy. Initial image demonstrates calcific density in the distal small bowel, unchanged in position, uncertain etiology. Again, this does not appear to represent the site of the obstruction, in this patient with changes of Crohn's disease and likely strictures. The omental nodularity is not as well-defined on the current CT study. The lower abdominal nodularity is mass thigh reactive ascitic fluid, with no safe target identified during the targeting phase of the procedure. There are small nodules in the upper abdomen adjacent to the distal stomach that were are also targeted, without successful identification given respiratory motion and the size of the nodules, which are smaller than the slice thicknesses of the planning CT. Given  that there was no safe site for biopsy, biopsy deferred at this time. Body wall and omental anasarca/edema. Reactive ascitic fluid within small bowel loops, particularly at the right lower quadrant at the site of the previously suggested biopsy target. Evidence of continued bowel obstruction. IMPRESSION: Limited noncontrast CT during planning phase for attempted omental nodule CT-guided biopsy demonstrates there is no safe target on today's study, with the most reasonable site for biopsy masked by edema and ascitic fluid. Biopsy was deferred at this time. Images also demonstrate persistent small bowel obstruction, with unchanged position of the dense object at the ileocecal valve. Signed, Dulcy Fanny. Earleen Newport, DO [facility] Radiology PLAN: Would suggest another attempt at omental guided biopsy once the patient's condition improves, of small bowel obstruction and reactive changes/ascites resolves. Either CT-guided  biopsy or repeat diagnostic CT scan may be considered. Electronically Signed   By: Corrie Mckusick D.O.   On: 09/19/2017 10:28   Ct Entero Abd/pelvis W Contast  Result Date: 09/04/2017 CLINICAL DATA:  83 year old female with history of mid abdominal pain for the past 4 months. History of Crohn's disease. EXAM: CT ABDOMEN AND PELVIS WITH CONTRAST (ENTEROGRAPHY) TECHNIQUE: Multidetector CT of the abdomen and pelvis during bolus administration of intravenous contrast. Negative oral contrast was given. CONTRAST:  160mL ISOVUE-300 IOPAMIDOL (ISOVUE-300) INJECTION 61% COMPARISON:  CT the abdomen and pelvis 05/31/2017. FINDINGS: Lower chest: Calcifications of the aortic valve. Mild cardiomegaly. Hepatobiliary: No cystic or solid hepatic lesions. No intra or extrahepatic biliary ductal dilatation. Gallbladder is not visualized and presumably surgically absent (no surgical clips are noted in the gallbladder fossa). Pancreas: No pancreatic mass. No pancreatic ductal  dilatation. No pancreatic or peripancreatic fluid or inflammatory changes. Spleen: Unremarkable. Adrenals/Urinary Tract: Multifocal cortical thinning in the kidneys bilaterally. No suspicious renal lesions. No hydroureteronephrosis. Urinary bladder is normal in appearance. Bilateral adrenal glands nor appearance. Stomach/Bowel: The appearance of the stomach is normal. There is no pathologic dilatation of small bowel or colon. Enterography images demonstrate profound mural thickening of the distal and terminal ileum, with hyperenhancement of the associated mucosa. No overt surrounding inflammatory changes are noted at this time. No other focal areas of mural thickening are noted in the small bowel or colon. In the lumen of the terminal ileum shortly before the ileocecal valve there is again a dense cylindrical shaped foreign body measuring 16 x 13 x 13 mm. Normal appendix. Vascular/Lymphatic: Aortic atherosclerosis, without evidence of aneurysm or dissection in the abdominal or pelvic vasculature. No lymphadenopathy noted in the abdomen or pelvis. Reproductive: Uterus and ovaries are unremarkable in appearance. Other: Multiple areas of apparent soft tissue nodularity throughout the omentum, measuring up to 9 mm in diameter (axial image 77 of series 3). No significant volume of ascites. No pneumoperitoneum. Mild diffuse body wall edema. Musculoskeletal: There are no aggressive appearing lytic or blastic lesions noted in the visualized portions of the skeleton. IMPRESSION: 1. Extensive mural thickening and mucosal hyperenhancement throughout the distal and terminal ileum, compatible with the reported clinical history of Crohn's disease. At this time, there are no overt surrounding inflammatory changes to suggest Crohn's exacerbation. 2. Unusual soft tissue nodularity noted throughout the omentum. This is of uncertain etiology and significance, however, this is most commonly seen in the setting of intraperitoneal  malignancy. Further clinical evaluation and correlation with tumor markers is suggested. 3. Previously noted intraluminal form body remains in position in the terminal ileum. This is 8 dense cylindrical object measuring 16 x 13 x 13 mm. 4. Aortic atherosclerosis. 5. There are calcifications of the aortic valve. Echocardiographic correlation for evaluation of potential valvular dysfunction may be warranted if clinically indicated. 6. Additional incidental findings, as above. Aortic Atherosclerosis (ICD10-I70.0). Electronically Signed   By: Vinnie Langton M.D.   On: 09/04/2017 10:43   Dg Abdomen Acute W/chest  Result Date: 09/16/2017 CLINICAL DATA:  Initial evaluation for acute vomiting, abdominal pain. EXAM: DG ABDOMEN ACUTE W/ 1V CHEST COMPARISON:  Prior CT from 09/04/2017. FINDINGS: Cardiac and mediastinal silhouettes within normal limits. Lungs hypoinflated. Mild bibasilar atelectasis. No focal infiltrates. No pulmonary edema or pleural effusion. No pneumothorax. Multiple dilated gas-filled loops of small bowel seen within the upper mid and left abdomen. These measure up to 5.2 cm. Associated air-fluid levels. Findings consistent with small bowel obstruction. No soft tissue mass  or abnormal calcification. No free air. IMPRESSION: 1. Multiple dilated gas-filled loops of small bowel with associated air-fluid levels, consistent with small bowel obstruction. 2. Shallow lung inflation with mild bibasilar atelectasis. Electronically Signed   By: Jeannine Boga M.D.   On: 09/16/2017 19:16   Dg Abd Portable 1v  Result Date: 09/21/2017 CLINICAL DATA:  Follow-up ileus. EXAM: PORTABLE ABDOMEN - 1 VIEW COMPARISON:  09/20/2017 FINDINGS: Nasogastric tube is in place, tip overlying the very proximal aspect of the stomach. There are significantly dilated central small bowel loops, measuring up to 5.6 cm. Gas is identified within nondilated loops of large bowel. No evidence for free intraperitoneal air. IMPRESSION:  Findings consistent with persistent high-grade small bowel obstruction. Electronically Signed   By: Nolon Nations M.D.   On: 09/21/2017 08:18   Dg Abd Portable 1v  Result Date: 09/20/2017 CLINICAL DATA:  83 year old female with small bowel obstruction. EXAM: PORTABLE ABDOMEN - 1 VIEW COMPARISON:  09/18/2017 FINDINGS: Moderately distended loops of gas-filled small bowel are again seen throughout the mid abdomen. Air is identified within the colon and rectum. Please note, evaluation for free intraperitoneal air is limited on single supine radiograph. No significant osseous abnormalities. IMPRESSION: 1. Persistent moderate distention of gas-filled small bowel loops within the mid abdomen. 2. Air is now identified within the colon and rectum. Electronically Signed   By: Kristopher Oppenheim M.D.   On: 09/20/2017 08:12   Dg Abd Portable 1v  Result Date: 09/18/2017 CLINICAL DATA:  Abdominal pain, small bowel obstruction. EXAM: PORTABLE ABDOMEN - 1 VIEW COMPARISON:  Abdominal CT scan of September 16, 2017 and abdominal series of the same day. FINDINGS: There remain loops of moderately distended gas-filled small bowel in the midline. The uric free of gaseous distention of portions of the jejunum reaches 8.5 cm. No significant gas or stool is observed in the colon. The stomach is nondistended. The esophagogastric tube tip in proximal port project in the gastric cardia. There is contrast material within the urinary bladder. IMPRESSION: Persistent mid to distal small bowel obstruction with significant gaseous distention of small-bowel loops. No evidence of perforation currently. Electronically Signed   By: David  Martinique M.D.   On: 09/18/2017 10:44    Lab Data:  CBC: Recent Labs  Lab 09/17/17 0711 09/18/17 0849 09/19/17 0324 09/21/17 0353 09/22/17 0452  WBC 9.3 12.0* 11.1* 11.8* 10.6*  NEUTROABS  --   --   --   --  7.4  HGB 10.9* 11.2* 10.7* 10.4* 10.1*  HCT 33.9* 35.4* 34.1* 33.0* 31.9*  MCV 87.6 88.9 88.6  90.4 88.9  PLT 209 254 246 216 465   Basic Metabolic Panel: Recent Labs  Lab 09/17/17 0711 09/18/17 0849 09/19/17 0324 09/21/17 0353 09/22/17 0452  NA 134* 137 138 140 138  K 4.3 4.2 4.5 4.4 3.9  CL 101 106 108 108 106  CO2 24 22 23 23 26   GLUCOSE 130* 90 86 89 139*  BUN 12 16 16 16 19   CREATININE 1.20* 1.20* 1.14* 1.11* 1.01*  CALCIUM 7.8* 7.7* 7.6* 7.9* 8.1*  MG  --   --   --   --  1.3*  PHOS  --   --   --   --  3.2   GFR: Estimated Creatinine Clearance: 41.2 mL/min (A) (by C-G formula based on SCr of 1.01 mg/dL (H)). Liver Function Tests: Recent Labs  Lab 09/16/17 1507 09/21/17 0353 09/22/17 0452  AST 13* 16 15  ALT 8* 11* 11*  ALKPHOS 65  45 45  BILITOT 0.5 0.5 0.4  PROT 6.6 4.9* 5.0*  ALBUMIN 3.0* 2.5* 2.5*   Recent Labs  Lab 09/16/17 1507  LIPASE 17   No results for input(s): AMMONIA in the last 168 hours. Coagulation Profile: Recent Labs  Lab 09/18/17 0558  INR 1.19   Cardiac Enzymes: No results for input(s): CKTOTAL, CKMB, CKMBINDEX, TROPONINI in the last 168 hours. BNP (last 3 results) No results for input(s): PROBNP in the last 8760 hours. HbA1C: No results for input(s): HGBA1C in the last 72 hours. CBG: Recent Labs  Lab 09/21/17 1745 09/22/17 0037 09/22/17 0632  GLUCAP 115* 151* 128*   Lipid Profile: Recent Labs    09/22/17 0452  TRIG 106   Thyroid Function Tests: No results for input(s): TSH, T4TOTAL, FREET4, T3FREE, THYROIDAB in the last 72 hours. Anemia Panel: No results for input(s): VITAMINB12, FOLATE, FERRITIN, TIBC, IRON, RETICCTPCT in the last 72 hours. Urine analysis:    Component Value Date/Time   COLORURINE YELLOW 09/17/2017 0425   APPEARANCEUR CLEAR 09/17/2017 0425   LABSPEC 1.044 (H) 09/17/2017 0425   PHURINE 5.0 09/17/2017 0425   GLUCOSEU NEGATIVE 09/17/2017 0425   HGBUR NEGATIVE 09/17/2017 0425   BILIRUBINUR NEGATIVE 09/17/2017 0425   KETONESUR NEGATIVE 09/17/2017 0425   PROTEINUR NEGATIVE 09/17/2017 0425    NITRITE NEGATIVE 09/17/2017 0425   LEUKOCYTESUR NEGATIVE 09/17/2017 0425     Manroop Jakubowicz M.D. [facility] 09/22/2017, 11:57 AM  Pager: [phone] Between 7am to 7pm - call Pager - [phone]  After 7pm go to [email or web address] - password [email or web address]  Call night coverage person covering after 7pm

## 2017-09-22 NOTE — Progress Notes (Signed)
[place] NOTE   Pharmacy Consult for TPN Indication: prolonged ileus  Patient Measurements: Height: 5' 2"  (157.5 cm) Weight: 181 lb (82.1 kg) IBW/kg (Calculated) : 50.1 TPN AdjBW (KG): 55.7 Body mass index is 33.11 kg/m.  Assessment:  41 yof with recent hx of SBO in July secondary to Crohn's disease admitted. SBO resolved / improved after NGT and steroids, though has had some abdominal sxs in the intervening time.Patient admitted with N/V, abd distention, found to have another SBO. CT scan on 10/23 showed findings suggestive of Crohns, omental nodularity worrisome for malignancy (unable to have biopsy at [facility] this week due to inflammation/SBO), and foreign body in terminal ilium, possibly a tablet present since CT scan in July.  GI: Crohn's dz, high-grade SBO, ommental nodularity. Foreign body in small intestine. May take to surgery on 11/12, discussing options with family. Pre-albumin 11.7. Abdomen distension ongoing. +flatus, last BM 11/9. IR to replace NGT. Folic acid, PPI Endo: No DM hx - CBGs remain controlled but trending up slightly since TPN started (110-130s) Noted on Solu-medrol reduced to 48m daily. Synthroid Insulin requirements in the past 24 hours: 5 units of SSI Lytes:  All wnl, Phos ok and Mg low at 1.3. CoCa 9.1. Renal: SCr 1.01 - relatively stable, CrCl~35, UOP good at 1.175mkg/hr yesterday. lasix x 1 on 11/9. I/Os not charted correctly. NS at 50 ml/hr Pulm: RA > [place] Cards: BP slightly high, HR 100s - lopressor IV Hepatobil: LFTs/Tbili/Alk phos WNL. Trig ok at 106. Neuro: morph prn ID: Previously completed 48m17mo INH for prior +TB test. ID felt no further tx necessary. WBC 10.6 stable, Afebrile - no abx  Best Practices: enox TPN Access: PICC for 09/21/17 pending TPN start date: 09/21/17  Nutritional Goals (per RD recommendation on 11/9): KCal:  1600-1800 kcal Protein:  80-95 g Fluid: 1.6-1.8 L  Current Nutrition:   NPO TPN  Plan:  Increase TPN to goal at 65 ml/hr tonight at 1800 TPN provides 90 g of protein, 250g of dextrose, and 47g lipids providing 1679 total kcal, which meets ~100% of patient needs Decrease NS to 10 ml/hr today Electrolytes in TPN: Increase Mg slightly, Cl:Ac ratio 1:1 Add MVI and trace elements in TPN Add Folic Acid 1mg8m TPN Continue moderate SSI q6h and adjust as needed Monitor TPN labs, Bmet and Mg tomorrow, clinical progress F/U Surgery plans  Give Mg 4g IV x 1 today  NathElenor QuinonesarmD, BCPSSierra Nevada Memorial Hospitalnical Pharmacist Pager [phone]/[phone] 7:38 AM

## 2017-09-23 ENCOUNTER — Inpatient Hospital Stay (HOSPITAL_COMMUNITY): Payer: Medicare Other

## 2017-09-23 LAB — GLUCOSE, CAPILLARY
GLUCOSE-CAPILLARY: 138 mg/dL — AB (ref 65–99)
Glucose-Capillary: 156 mg/dL — ABNORMAL HIGH (ref 65–99)
Glucose-Capillary: 176 mg/dL — ABNORMAL HIGH (ref 65–99)
Glucose-Capillary: 191 mg/dL — ABNORMAL HIGH (ref 65–99)

## 2017-09-23 LAB — BASIC METABOLIC PANEL
ANION GAP: 5 (ref 5–15)
BUN: 24 mg/dL — AB (ref 6–20)
CALCIUM: 8 mg/dL — AB (ref 8.9–10.3)
CO2: 28 mmol/L (ref 22–32)
Chloride: 105 mmol/L (ref 101–111)
Creatinine, Ser: 0.9 mg/dL (ref 0.44–1.00)
GFR calc Af Amer: >60 mL/min (ref >60)
GFR calc non Af Amer: 57 mL/min — ABNORMAL LOW (ref >60)
GLUCOSE: 133 mg/dL — AB (ref 65–99)
Potassium: 4.1 mmol/L (ref 3.5–5.1)
Sodium: 138 mmol/L (ref 135–145)

## 2017-09-23 LAB — CBC
HEMATOCRIT: 31.9 % — AB (ref 36.0–46.0)
Hemoglobin: 10.2 g/dL — ABNORMAL LOW (ref 12.0–15.0)
MCH: 28.1 pg (ref 26.0–34.0)
MCHC: 32 g/dL (ref 30.0–36.0)
MCV: 87.9 fL (ref 78.0–100.0)
PLATELETS: 176 10*3/uL (ref 150–400)
RBC: 3.63 MIL/uL — ABNORMAL LOW (ref 3.87–5.11)
RDW: 14.2 % (ref 11.5–15.5)
WBC: 13.6 10*3/uL — ABNORMAL HIGH (ref 4.0–10.5)

## 2017-09-23 LAB — MAGNESIUM: Magnesium: 2 mg/dL (ref 1.7–2.4)

## 2017-09-23 MED ORDER — TRAVASOL 10 % IV SOLN
INTRAVENOUS | Status: AC
  Administered 2017-09-23: 18:00:00 via INTRAVENOUS
  Filled 2017-09-23: qty 904.8

## 2017-09-23 MED ORDER — DEXTROSE 5 % IV SOLN
1.0000 g | INTRAVENOUS | Status: AC
  Administered 2017-09-24: 1 g via INTRAVENOUS
  Filled 2017-09-23: qty 1

## 2017-09-23 MED ORDER — LACTATED RINGERS IV BOLUS (SEPSIS)
1000.0000 mL | Freq: Once | INTRAVENOUS | Status: AC
  Administered 2017-09-23: 1000 mL via INTRAVENOUS

## 2017-09-23 NOTE — Progress Notes (Signed)
[facility]                                                                              Patient Demographics  Terri [name], is a 83 y.o. female, DOB - 1931-10-21, OEU:[id number]  Admit date - 09/16/2017   Admitting Physician Etta Quill, DO  Outpatient Primary MD for the patient is [name], [name], MD  Outpatient specialists:   LOS - 7  days   Medical records reviewed and are as summarized below:    Chief Complaint  Patient presents with  . Abdominal Pain  . Nausea  . Emesis       Brief summary   HPI on 09/16/2017 by Dr. Jolene Schimke Braughtonis a 83 y.o.femalewith medical history significant ofCrohn's disease diagnosed x5 years ago. SBO in July this year felt to be secondary to Crohn's disease. SBO resolved / improved after NGT and steroids though she has been having some abdominal symptoms in the intervening time. Patient presents to the ED with c/o nausea, abd distention, concern for another SBO. Of note patient just had CT scan on 10/23 which showed findings suggestive of Crohns, omental nodularity worrisome for malignancy (patient had biopsy scheduled at [facility] on Wed this week), and foreign body in terminal ilium, possibly a pill or tablet that has been present since CT scan in July of this year     Assessment & Plan    Principal Problem:   SBO (small bowel obstruction) ([place]) -CT abdomen showed high-grade mechanical SBO with transition point in the right lower quadrant/pelvis and related to disease segment of terminal ileum demonstrating wall thickening and possible stricturing -Continue NPO/NGT, surgery following, PICC line placed, started TPN -Abdominal x-ray 11/11 today showed persistent high-grade small bowel obstruction.  Surgery planning OR tomorrow  Active Problems: Omental nodularity - IR was consulted, discussed with Dr. Tresea Mall, unable to do-the omental biopsy due to significant reactive inflammation and SBO.   Recommended outpatient biopsy after the acute hospitalization is resolved    Crohn's disease ([place]) -GI consulted, ID was consulted by GI for possible Remicade use.  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  Patient had completed 6 months of INH -  ID felt no other therapy indicated prior to considering of biological agents.    Foreign body of small intestine, subsequent encounter -Noted on CT abdomen pelvis 17 mm firm object in the terminal ileum, General surgery following  Hypothyroidism Continue Synthroid  Dyspnea/wheezing Improved  Code Status: Full CODE STATUS DVT Prophylaxis:  Lovenox  Family Communication: Discussed in detail with the patient, all imaging results, lab results explained to the patient    Disposition Plan:  Time Spent in minutes  15 minutes  Procedures:    Consultants:   General surgery GI IR  Antimicrobials:      Medications  Scheduled Meds: . enoxaparin (LOVENOX) injection  40 mg Subcutaneous Q24H  . insulin aspart  0-15 Units Subcutaneous Q6H  . levothyroxine  50 mcg Intravenous Daily  . mouth rinse  15 mL Mouth Rinse BID  . methylPREDNISolone (SOLU-MEDROL) injection  60 mg Intravenous Daily  . metoprolol tartrate  2.5 mg Intravenous Q6H  .  pantoprazole (PROTONIX) IV  40 mg Intravenous Q24H   Continuous Infusions: . sodium chloride 20 mL/hr at 09/21/17 1743  . lactated ringers    . TPN ADULT (ION) 65 mL/hr at 09/22/17 1840  . TPN ADULT (ION)     PRN Meds:.acetaminophen **OR** acetaminophen, hydroxypropyl methylcellulose / hypromellose, iopamidol, morphine injection, ondansetron **OR** ondansetron (ZOFRAN) IV, sodium chloride flush   Antibiotics   Anti-infectives (From admission, onward)   None        Subjective:   Terri [name] was seen and examined today.  Small BM yesterday, per patient "mucousy".  Passing flatus.  No shortness of breath.  No fevers or chills.  No abdominal pain.  Patient denies dizziness, chest pain,  new weakness,  numbess, tingling. No acute events overnight.  Objective:   Vitals:   09/22/17 1112 09/22/17 1500 09/22/17 2139 09/23/17 0539  BP: 126/64 128/60 131/82 133/74  Pulse: 92 90 60 (!) 120  Resp: 20 20    Temp: 98.1 F (36.7 C) 98 F (36.7 C) 98.1 F (36.7 C) (!) 97.5 F (36.4 C)  TempSrc: Oral Oral Oral Oral  SpO2: 98% 96% 99% 96%  Weight:      Height:        Intake/Output Summary (Last 24 hours) at 09/23/2017 1156 Last data filed at 09/22/2017 1500 Gross per 24 hour  Intake 1069.17 ml  Output 250 ml  Net 819.17 ml     Wt Readings from Last 3 Encounters:  09/22/17 82.1 kg (181 lb)  09/22/17 82.4 kg (181 lb 11.2 oz)  09/19/17 72.6 kg (160 lb)     Exam   General: Alert and oriented x 3, NAD, NGT+  Eyes:   HEENT:    Cardiovascular: S1 S2 clear, RRR, No pedal edema b/l  Respiratory: Decreased breath sound at the bases  Gastrointestinal: Soft, distended, hypoactive bowel sounds  Ext: no pedal edema bilaterally  Neuro: no new deficits  Musculoskeletal: No digital cyanosis, clubbing  Skin: No rashes  Psych: Normal affect and demeanor, alert and oriented x3    Data Reviewed:  I have personally reviewed following labs and imaging studies  Micro Results No results found for this or any previous visit (from the past 240 hour(s)).  Radiology Reports Dg Abd 1 View  Result Date: 09/21/2017 CLINICAL DATA:  Small bowel obstruction.  Repositioning of NG tube. EXAM: ABDOMEN - 1 VIEW FLUOROSCOPY TIME:  30 seconds COMPARISON:  Abdominal radiographs dated 09/21/2017 at 7:43 a.m. FINDINGS: NG tube was advanced under direct fluoroscopic visualization. The tip of the tube is now in the distal stomach. The stomach is decompressed. IMPRESSION: NG tube advanced into the distal stomach. The stomach is decompressed. Electronically Signed   By: Lorriane Shire M.D.   On: 09/21/2017 10:22   Ct Abdomen Pelvis W Contrast  Result Date: 09/16/2017 CLINICAL DATA:  Bowel obstruction  EXAM: CT ABDOMEN AND PELVIS WITH CONTRAST TECHNIQUE: Multidetector CT imaging of the abdomen and pelvis was performed using the standard protocol following bolus administration of intravenous contrast. CONTRAST:  150mL ISOVUE-300 IOPAMIDOL (ISOVUE-300) INJECTION 61% COMPARISON:  09/16/2017, 09/04/2017, 05/31/2017 FINDINGS: Lower chest: Lung bases demonstrate no acute consolidation or pleural effusion. Heart size upper normal. Atherosclerotic calcifications. Hepatobiliary: Surgical absence of gallbladder. Mild intra and extrahepatic biliary enlargement similar compared to prior. Pancreas: Atrophic.  No inflammation Spleen: Normal in size without focal abnormality. Adrenals/Urinary Tract: Adrenal glands are within normal limits. Multifocal cortical scarring in the kidneys. No hydronephrosis. The bladder is within normal limits Stomach/Bowel:  Esophageal tube is present, the tip terminates along the wall of the proximal stomach. Multiple loops of markedly dilated, fluid-filled small bowel, measuring up to 6 cm in diameter. Transition point visualized in the right pelvis, series 3, image number 69 and is related to a disease segment of terminal ileum which appears very thickened. The colon is collapsed. Re- demonstrated rectangular 17 mm foreign object in the terminal ileum. Vascular/Lymphatic: Aortic atherosclerosis. No enlarged abdominal or pelvic lymph nodes. Reproductive: Uterus and bilateral adnexa are unremarkable. Other: Negative for free air. Tiny amount of free fluid in the pelvis. Re- demonstrated mild nodularity of the omentum most notable in the right upper quadrant. Musculoskeletal: Degenerative changes of the spine. No acute or suspicious bone lesion. IMPRESSION: 1. Findings consistent with high-grade mechanical small bowel obstruction. Transition point visualized in the right lower quadrant/pelvis and is related to a diseased segment of terminal ileum which demonstrates significant wall thickening and  possible stricturing. Negative for free air. 2. Re- demonstrated 17 mm rectangular foreign object in the terminal ileum. 3. Mild nodularity of the omentum re- demonstrated, correlation with tumor markers previously recommended. Electronically Signed   By: Donavan Foil M.D.   On: 09/16/2017 22:14   Ct Abd Limited W/o Cm  Result Date: 09/19/2017 CLINICAL DATA:  83 year old female with a history of Crohn's disease, with CT dated 05/31/2017 showing bowel obstruction. Follow-up CT imaging September 04, 2017 again shows evidence of Crohn disease, with new omental nodularity. Patient was referred as outpatient for CT-guided omental biopsy. In the interval the patient was admitted with recurrent bowel obstruction, 09/16/2017. Patient presents as inpatient for attempted omental biopsy. EXAM: CT ABDOMEN WITHOUT CONTRAST LIMITED TECHNIQUE: Multidetector CT imaging of the abdomen was performed following the standard protocol without IV contrast. COMPARISON:  Multiple prior, most recent 09/16/2017, 09/04/2017 FINDINGS: Limited CT of the abdomen was performed for planning purposes for a scheduled CT-guided omental biopsy. Initial image demonstrates calcific density in the distal small bowel, unchanged in position, uncertain etiology. Again, this does not appear to represent the site of the obstruction, in this patient with changes of Crohn's disease and likely strictures. The omental nodularity is not as well-defined on the current CT study. The lower abdominal nodularity is mass thigh reactive ascitic fluid, with no safe target identified during the targeting phase of the procedure. There are small nodules in the upper abdomen adjacent to the distal stomach that were are also targeted, without successful identification given respiratory motion and the size of the nodules, which are smaller than the slice thicknesses of the planning CT. Given that there was no safe site for biopsy, biopsy deferred at this time. Body wall and  omental anasarca/edema. Reactive ascitic fluid within small bowel loops, particularly at the right lower quadrant at the site of the previously suggested biopsy target. Evidence of continued bowel obstruction. IMPRESSION: Limited noncontrast CT during planning phase for attempted omental nodule CT-guided biopsy demonstrates there is no safe target on today's study, with the most reasonable site for biopsy masked by edema and ascitic fluid. Biopsy was deferred at this time. Images also demonstrate persistent small bowel obstruction, with unchanged position of the dense object at the ileocecal valve. Signed, Dulcy Fanny. Earleen Newport, DO [facility] Radiology PLAN: Would suggest another attempt at omental guided biopsy once the patient's condition improves, of small bowel obstruction and reactive changes/ascites resolves. Either CT-guided biopsy or repeat diagnostic CT scan may be considered. Electronically Signed   By: Corrie Mckusick  D.O.   On: 09/19/2017 10:28   Ct [place] Abd/pelvis W Contast  Result Date: 09/04/2017 CLINICAL DATA:  83 year old female with history of mid abdominal pain for the past 4 months. History of Crohn's disease. EXAM: CT ABDOMEN AND PELVIS WITH CONTRAST (ENTEROGRAPHY) TECHNIQUE: Multidetector CT of the abdomen and pelvis during bolus administration of intravenous contrast. Negative oral contrast was given. CONTRAST:  159mL ISOVUE-300 IOPAMIDOL (ISOVUE-300) INJECTION 61% COMPARISON:  CT the abdomen and pelvis 05/31/2017. FINDINGS: Lower chest: Calcifications of the aortic valve. Mild cardiomegaly. Hepatobiliary: No cystic or solid hepatic lesions. No intra or extrahepatic biliary ductal dilatation. Gallbladder is not visualized and presumably surgically absent (no surgical clips are noted in the gallbladder fossa). Pancreas: No pancreatic mass. No pancreatic ductal dilatation. No pancreatic or peripancreatic fluid or inflammatory changes. Spleen:  Unremarkable. Adrenals/Urinary Tract: Multifocal cortical thinning in the kidneys bilaterally. No suspicious renal lesions. No hydroureteronephrosis. Urinary bladder is normal in appearance. Bilateral adrenal glands nor appearance. Stomach/Bowel: The appearance of the stomach is normal. There is no pathologic dilatation of small bowel or colon. Enterography images demonstrate profound mural thickening of the distal and terminal ileum, with hyperenhancement of the associated mucosa. No overt surrounding inflammatory changes are noted at this time. No other focal areas of mural thickening are noted in the small bowel or colon. In the lumen of the terminal ileum shortly before the ileocecal valve there is again a dense cylindrical shaped foreign body measuring 16 x 13 x 13 mm. Normal appendix. Vascular/Lymphatic: Aortic atherosclerosis, without evidence of aneurysm or dissection in the abdominal or pelvic vasculature. No lymphadenopathy noted in the abdomen or pelvis. Reproductive: Uterus and ovaries are unremarkable in appearance. Other: Multiple areas of apparent soft tissue nodularity throughout the omentum, measuring up to 9 mm in diameter (axial image 77 of series 3). No significant volume of ascites. No pneumoperitoneum. Mild diffuse body wall edema. Musculoskeletal: There are no aggressive appearing lytic or blastic lesions noted in the visualized portions of the skeleton. IMPRESSION: 1. Extensive mural thickening and mucosal hyperenhancement throughout the distal and terminal ileum, compatible with the reported clinical history of Crohn's disease. At this time, there are no overt surrounding inflammatory changes to suggest Crohn's exacerbation. 2. Unusual soft tissue nodularity noted throughout the omentum. This is of uncertain etiology and significance, however, this is most commonly seen in the setting of intraperitoneal malignancy. Further clinical evaluation and correlation with tumor markers is suggested.  3. Previously noted intraluminal form body remains in position in the terminal ileum. This is 8 dense cylindrical object measuring 16 x 13 x 13 mm. 4. Aortic atherosclerosis. 5. There are calcifications of the aortic valve. Echocardiographic correlation for evaluation of potential valvular dysfunction may be warranted if clinically indicated. 6. Additional incidental findings, as above. Aortic Atherosclerosis (ICD10-I70.0). Electronically Signed   By: Vinnie Langton M.D.   On: 09/04/2017 10:43   Dg Abdomen Acute W/chest  Result Date: 09/16/2017 CLINICAL DATA:  Initial evaluation for acute vomiting, abdominal pain. EXAM: DG ABDOMEN ACUTE W/ 1V CHEST COMPARISON:  Prior CT from 09/04/2017. FINDINGS: Cardiac and mediastinal silhouettes within normal limits. Lungs hypoinflated. Mild bibasilar atelectasis. No focal infiltrates. No pulmonary edema or pleural effusion. No pneumothorax. Multiple dilated gas-filled loops of small bowel seen within the upper mid and left abdomen. These measure up to 5.2 cm. Associated air-fluid levels. Findings consistent with small bowel obstruction. No soft tissue mass or abnormal calcification. No free air. IMPRESSION: 1. Multiple dilated gas-filled loops of small bowel with associated  air-fluid levels, consistent with small bowel obstruction. 2. Shallow lung inflation with mild bibasilar atelectasis. Electronically Signed   By: Jeannine Boga M.D.   On: 09/16/2017 19:16   Dg Abd Portable 1v  Result Date: 09/23/2017 CLINICAL DATA:  Small bowel obstruction EXAM: PORTABLE ABDOMEN - 1 VIEW COMPARISON:  09/21/2017; 09/20/2017 FINDINGS: Re- demonstrated marked gas distention of several centralized loops of rather patulous appearing small bowel with index loop within the left mid hemiabdomen measuring approximately 5.5 cm in diameter, unchanged. These findings are again associated with a conspicuous paucity of distal colonic gas. Nondiagnostic evaluation for pneumoperitoneum  secondary supine positioning and exclusion of the lower thorax. No pneumatosis or portal venous gas. Enteric tube tip and side port projected the expected location of the gastric fundus. Several phleboliths overlie the left hemipelvis. Otherwise, no definitive abnormal intra-abdominal calcifications. No definite acute osseus abnormalities. IMPRESSION: Similar findings worrisome for high a grade small-bowel obstruction. Electronically Signed   By: Sandi Mariscal M.D.   On: 09/23/2017 09:02   Dg Abd Portable 1v  Result Date: 09/21/2017 CLINICAL DATA:  Follow-up ileus. EXAM: PORTABLE ABDOMEN - 1 VIEW COMPARISON:  09/20/2017 FINDINGS: Nasogastric tube is in place, tip overlying the very proximal aspect of the stomach. There are significantly dilated central small bowel loops, measuring up to 5.6 cm. Gas is identified within nondilated loops of large bowel. No evidence for free intraperitoneal air. IMPRESSION: Findings consistent with persistent high-grade small bowel obstruction. Electronically Signed   By: Nolon Nations M.D.   On: 09/21/2017 08:18   Dg Abd Portable 1v  Result Date: 09/20/2017 CLINICAL DATA:  83 year old female with small bowel obstruction. EXAM: PORTABLE ABDOMEN - 1 VIEW COMPARISON:  09/18/2017 FINDINGS: Moderately distended loops of gas-filled small bowel are again seen throughout the mid abdomen. Air is identified within the colon and rectum. Please note, evaluation for free intraperitoneal air is limited on single supine radiograph. No significant osseous abnormalities. IMPRESSION: 1. Persistent moderate distention of gas-filled small bowel loops within the mid abdomen. 2. Air is now identified within the colon and rectum. Electronically Signed   By: Kristopher Oppenheim M.D.   On: 09/20/2017 08:12   Dg Abd Portable 1v  Result Date: 09/18/2017 CLINICAL DATA:  Abdominal pain, small bowel obstruction. EXAM: PORTABLE ABDOMEN - 1 VIEW COMPARISON:  Abdominal CT scan of September 16, 2017 and  abdominal series of the same day. FINDINGS: There remain loops of moderately distended gas-filled small bowel in the midline. The uric free of gaseous distention of portions of the jejunum reaches 8.5 cm. No significant gas or stool is observed in the colon. The stomach is nondistended. The esophagogastric tube tip in proximal port project in the gastric cardia. There is contrast material within the urinary bladder. IMPRESSION: Persistent mid to distal small bowel obstruction with significant gaseous distention of small-bowel loops. No evidence of perforation currently. Electronically Signed   By: David  Martinique M.D.   On: 09/18/2017 10:44    Lab Data:  CBC: Recent Labs  Lab 09/18/17 0849 09/19/17 0324 09/21/17 0353 09/22/17 0452 09/23/17 0452  WBC 12.0* 11.1* 11.8* 10.6* 13.6*  NEUTROABS  --   --   --  7.4  --   HGB 11.2* 10.7* 10.4* 10.1* 10.2*  HCT 35.4* 34.1* 33.0* 31.9* 31.9*  MCV 88.9 88.6 90.4 88.9 87.9  PLT 254 246 216 181 973   Basic Metabolic Panel: Recent Labs  Lab 09/18/17 0849 09/19/17 0324 09/21/17 0353 09/22/17 0452 09/23/17 0452  NA 137 138  140 138 138  K 4.2 4.5 4.4 3.9 4.1  CL 106 108 108 106 105  CO2 22 23 23 26 28   GLUCOSE 90 86 89 139* 133*  BUN 16 16 16 19  24*  CREATININE 1.20* 1.14* 1.11* 1.01* 0.90  CALCIUM 7.7* 7.6* 7.9* 8.1* 8.0*  MG  --   --   --  1.3* 2.0  PHOS  --   --   --  3.2  --    GFR: Estimated Creatinine Clearance: 46.2 mL/min (by C-G formula based on SCr of 0.9 mg/dL). Liver Function Tests: Recent Labs  Lab 09/16/17 1507 09/21/17 0353 09/22/17 0452  AST 13* 16 15  ALT 8* 11* 11*  ALKPHOS 65 45 45  BILITOT 0.5 0.5 0.4  PROT 6.6 4.9* 5.0*  ALBUMIN 3.0* 2.5* 2.5*   Recent Labs  Lab 09/16/17 1507  LIPASE 17   No results for input(s): AMMONIA in the last 168 hours. Coagulation Profile: Recent Labs  Lab 09/18/17 0558  INR 1.19   Cardiac Enzymes: No results for input(s): CKTOTAL, CKMB, CKMBINDEX, TROPONINI in the last 168  hours. BNP (last 3 results) No results for input(s): PROBNP in the last 8760 hours. HbA1C: No results for input(s): HGBA1C in the last 72 hours. CBG: Recent Labs  Lab 09/22/17 0632 09/22/17 1232 09/22/17 1717 09/22/17 2357 09/23/17 0537  GLUCAP 128* 154* 154* 176* 138*   Lipid Profile: Recent Labs    09/22/17 0452  TRIG 106   Thyroid Function Tests: No results for input(s): TSH, T4TOTAL, FREET4, T3FREE, THYROIDAB in the last 72 hours. Anemia Panel: No results for input(s): VITAMINB12, FOLATE, FERRITIN, TIBC, IRON, RETICCTPCT in the last 72 hours. Urine analysis:    Component Value Date/Time   COLORURINE YELLOW 09/17/2017 0425   APPEARANCEUR CLEAR 09/17/2017 0425   LABSPEC 1.044 (H) 09/17/2017 0425   PHURINE 5.0 09/17/2017 0425   GLUCOSEU NEGATIVE 09/17/2017 0425   HGBUR NEGATIVE 09/17/2017 0425   BILIRUBINUR NEGATIVE 09/17/2017 0425   KETONESUR NEGATIVE 09/17/2017 0425   PROTEINUR NEGATIVE 09/17/2017 0425   NITRITE NEGATIVE 09/17/2017 0425   LEUKOCYTESUR NEGATIVE 09/17/2017 0425     Amaka Gluth M.D. [facility] 09/23/2017, 11:56 AM  Pager: [phone] Between 7am to 7pm - call Pager - [phone]  After 7pm go to [email or web address] - password [email or web address]  Call night coverage person covering after 7pm

## 2017-09-23 NOTE — Plan of Care (Signed)
  Progressing Education: Knowledge of General Education information will improve 09/23/2017 0321 - Progressing by Anson Fret, RN Pain Managment: General experience of comfort will improve 09/23/2017 0321 - Progressing by Anson Fret, RN  POC reviewed with pt.; no c/o's pain.

## 2017-09-23 NOTE — Progress Notes (Addendum)
SBO; TI Crohn's stricture; SBO 05/2017; readmitted with the same.  Subjective No acute events. NG in place, no n/v. +flatus, no bm. Abdominal discomfort stable. XR this morning demonstrated stable findings - remains with sbo  Objective: Vital signs in last 24 hours: Temp:  [97.5 F (36.4 C)-98.1 F (36.7 C)] 97.5 F (36.4 C) (11/11 0539) Pulse Rate:  [60-120] 88 (11/11 0546) Resp:  [20] 20 (11/10 1500) BP: (126-133)/(60-82) 133/74 (11/11 0539) SpO2:  [96 %-99 %] 96 % (11/11 0539) Last BM Date: 09/21/17  Intake/Output from previous day: 11/10 0701 - 11/11 0700 In: 1069.2 [I.V.:1069.2] Out: 300 [Urine:200; Emesis/NG output:100] Intake/Output this shift: No intake/output data recorded.  Gen: NAD, comfortable CV: RRR Pulm: Normal work of breathing Abd: Soft, NT; moderately distended; no r/g Ext: SCDs in place  Lab Results: CBC  Recent Labs    09/22/17 0452 09/23/17 0452  WBC 10.6* 13.6*  HGB 10.1* 10.2*  HCT 31.9* 31.9*  PLT 181 176   BMET Recent Labs    09/22/17 0452 09/23/17 0452  NA 138 138  K 3.9 4.1  CL 106 105  CO2 26 28  GLUCOSE 139* 133*  BUN 19 24*  CREATININE 1.01* 0.90  CALCIUM 8.1* 8.0*    Studies/Results:  Anti-infectives: Anti-infectives (From admission, onward)   None       Assessment/Plan: Patient Active Problem List   Diagnosis Date Noted  . Foreign body of small intestine, subsequent encounter 09/16/2017  . Abnormal CT of the abdomen 09/16/2017  . SBO (small bowel obstruction) ([place]) 05/31/2017  . Crohn's disease ([place]) 05/31/2017  . Hx-TIA (transient ischemic attack) 05/31/2017  . HOH (hard of hearing) 05/31/2017  . AKI (acute kidney injury) ([place]) 05/31/2017  . Leukocytosis 05/31/2017  . Bilious vomiting with nausea   . Crohn's ileitis, with intestinal obstruction ([place])   . Dehydration    Probable Crohn's stricture of TI; readmitted with SBO; Alb 2.5, prealbumin 12 -Continue NPO/NGT/TPN -1L IVF bolus for marginal urine  output -Continue to monitor; anticipate OR this admission if no significant progress - possibly tomorrow   LOS: 7 days   Sharon Mt. Dema Severin, M.D. General and Colorectal Surgery [facility]

## 2017-09-23 NOTE — Progress Notes (Signed)
PHARMACY - ADULT TOTAL PARENTERAL NUTRITION CONSULT NOTE   Pharmacy Consult for TPN Indication: prolonged ileus  Patient Measurements: Height: 5' 2"  (157.5 cm) Weight: 181 lb (82.1 kg) IBW/kg (Calculated) : 50.1 TPN AdjBW (KG): 55.7 Body mass index is 33.11 kg/m.  Assessment:  5 yof with recent hx of SBO in July secondary to Crohn's disease admitted. SBO resolved / improved after NGT and steroids, though has had some abdominal sxs in the intervening time.Patient admitted with N/V, abd distention, found to have another SBO. CT scan on 10/23 showed findings suggestive of Crohns, omental nodularity worrisome for malignancy (unable to have biopsy at [facility] this week due to inflammation/SBO), and foreign body in terminal ilium, possibly a tablet present since CT scan in July.  GI: Crohn's dz, high-grade SBO, ommental nodularity. Foreign body in small intestine. May take to surgery on 11/12, discussing options with family. Pre-albumin 11.7. Abdomen distension ongoing. +flatus, last BM 11/9. IR to replace NGT. Folic acid, PPI Endo: No DM hx - CBGs remain controlled but trending since TPN started (130-170s) Noted on Solu-medrol reduced to 12m daily. Synthroid Insulin requirements in the past 24 hours: 11 units of SSI Lytes:  All wnl, Phos ok and Mg up to 2.0 after replacement. CoCa 9. Renal: SCr stable, CrCl~35, UOP minimal yesterday. BUN up slightly to 24. Lasix x 1 on 11/9. I/Os not charted correctly. NS at 20 ml/hr Pulm: RA > [place] Cards: BP ok, HR wnl this am - lopressor IV Hepatobil: LFTs/Tbili/Alk phos wnl. Trig ok at 106. Neuro: morph prn ID: Previously completed 639mof INH for prior +TB test. ID felt no further tx necessary. WBC 13.6 stable, Afebrile - no abx  Best Practices: enox TPN Access: PICC Double lumen TPN start date: 09/21/17 >>  Nutritional Goals (per RD recommendation on 11/9): KCal:  1600-1800 kcal Protein:  80-95 g Fluid: 1.6-1.8 L  Current Nutrition:  NPO TPN  Plan:   Continue TPN at 65 ml/hr TPN provides 90 g of protein, 250g of dextrose, and 47g lipids providing 1679 total kcal, which meets ~100% of patient needs Continue NS at 20 ml/hr per MD Electrolytes in TPN: Cl:Ac ratio 1:1 Add MVI, trace elements, folic acid 9m55mand 10 units of regular insulin to TPN Continue moderate SSI q6h and adjust as needed Monitor TPN labs, clinical progress F/U Surgery plans   NatElenor QuinonesharmD, BCPDigestive Disease Center Green Valleyinical Pharmacist Pager [phone]/09/2017 7:55 AM

## 2017-09-24 ENCOUNTER — Inpatient Hospital Stay (HOSPITAL_COMMUNITY): Payer: Medicare Other | Admitting: Certified Registered"

## 2017-09-24 ENCOUNTER — Encounter (HOSPITAL_COMMUNITY): Payer: Self-pay | Admitting: Anesthesiology

## 2017-09-24 ENCOUNTER — Encounter (HOSPITAL_COMMUNITY)
Admission: EM | Disposition: A | Discharge status: Home Health | Payer: Self-pay | Source: Home / Self Care | Attending: Internal Medicine

## 2017-09-24 DIAGNOSIS — C800 Disseminated malignant neoplasm, unspecified: Secondary | ICD-10-CM

## 2017-09-24 DIAGNOSIS — R1900 Intra-abdominal and pelvic swelling, mass and lump, unspecified site: Secondary | ICD-10-CM

## 2017-09-24 DIAGNOSIS — I4891 Unspecified atrial fibrillation: Secondary | ICD-10-CM

## 2017-09-24 DIAGNOSIS — K56609 Unspecified intestinal obstruction, unspecified as to partial versus complete obstruction: Secondary | ICD-10-CM

## 2017-09-24 HISTORY — PX: ILEOSTOMY: SHX1783

## 2017-09-24 HISTORY — PX: LAPAROTOMY: SHX154

## 2017-09-24 HISTORY — PX: OMENTECTOMY: SHX5985

## 2017-09-24 HISTORY — PX: ILEOCECETOMY: SHX5857

## 2017-09-24 LAB — SURGICAL PCR SCREEN
MRSA, PCR: NEGATIVE
Staphylococcus aureus: NEGATIVE

## 2017-09-24 LAB — CBC
HEMATOCRIT: 32.4 % — AB (ref 36.0–46.0)
HEMOGLOBIN: 10.2 g/dL — AB (ref 12.0–15.0)
MCH: 27.9 pg (ref 26.0–34.0)
MCHC: 31.5 g/dL (ref 30.0–36.0)
MCV: 88.5 fL (ref 78.0–100.0)
Platelets: 152 10*3/uL (ref 150–400)
RBC: 3.66 MIL/uL — AB (ref 3.87–5.11)
RDW: 14.2 % (ref 11.5–15.5)
WBC: 12.2 10*3/uL — AB (ref 4.0–10.5)

## 2017-09-24 LAB — GLUCOSE, CAPILLARY
GLUCOSE-CAPILLARY: 109 mg/dL — AB (ref 65–99)
GLUCOSE-CAPILLARY: 173 mg/dL — AB (ref 65–99)
Glucose-Capillary: 119 mg/dL — ABNORMAL HIGH (ref 65–99)
Glucose-Capillary: 146 mg/dL — ABNORMAL HIGH (ref 65–99)
Glucose-Capillary: 174 mg/dL — ABNORMAL HIGH (ref 65–99)
Glucose-Capillary: 188 mg/dL — ABNORMAL HIGH (ref 65–99)

## 2017-09-24 LAB — COMPREHENSIVE METABOLIC PANEL
ALBUMIN: 2.6 g/dL — AB (ref 3.5–5.0)
ALK PHOS: 45 U/L (ref 38–126)
ALT: 10 U/L — AB (ref 14–54)
AST: 15 U/L (ref 15–41)
Anion gap: 6 (ref 5–15)
BILIRUBIN TOTAL: 0.5 mg/dL (ref 0.3–1.2)
BUN: 29 mg/dL — ABNORMAL HIGH (ref 6–20)
CALCIUM: 8.1 mg/dL — AB (ref 8.9–10.3)
CO2: 28 mmol/L (ref 22–32)
CREATININE: 0.81 mg/dL (ref 0.44–1.00)
Chloride: 103 mmol/L (ref 101–111)
GFR calc Af Amer: >60 mL/min (ref >60)
GLUCOSE: 118 mg/dL — AB (ref 65–99)
Potassium: 4.2 mmol/L (ref 3.5–5.1)
Sodium: 137 mmol/L (ref 135–145)
TOTAL PROTEIN: 5 g/dL — AB (ref 6.5–8.1)

## 2017-09-24 LAB — BASIC METABOLIC PANEL
ANION GAP: 8 (ref 5–15)
BUN: 28 mg/dL — AB (ref 6–20)
CALCIUM: 7.4 mg/dL — AB (ref 8.9–10.3)
CO2: 25 mmol/L (ref 22–32)
Chloride: 101 mmol/L (ref 101–111)
Creatinine, Ser: 0.86 mg/dL (ref 0.44–1.00)
GFR calc Af Amer: >60 mL/min (ref >60)
GLUCOSE: 242 mg/dL — AB (ref 65–99)
POTASSIUM: 3.8 mmol/L (ref 3.5–5.1)
SODIUM: 134 mmol/L — AB (ref 135–145)

## 2017-09-24 LAB — MAGNESIUM
MAGNESIUM: 1.8 mg/dL (ref 1.7–2.4)
MAGNESIUM: 1.5 mg/dL — AB (ref 1.7–2.4)

## 2017-09-24 LAB — DIFFERENTIAL
BASOS ABS: 0 10*3/uL (ref 0.0–0.1)
BASOS PCT: 0 %
EOS ABS: 0 10*3/uL (ref 0.0–0.7)
Eosinophils Relative: 0 %
LYMPHS ABS: 2.5 10*3/uL (ref 0.7–4.0)
Lymphocytes Relative: 20 %
MONO ABS: 1.3 10*3/uL — AB (ref 0.1–1.0)
MONOS PCT: 11 %
Neutro Abs: 8.4 10*3/uL — ABNORMAL HIGH (ref 1.7–7.7)
Neutrophils Relative %: 69 %

## 2017-09-24 LAB — PREALBUMIN: Prealbumin: 12.4 mg/dL — ABNORMAL LOW (ref 18–38)

## 2017-09-24 LAB — TRIGLYCERIDES: Triglycerides: 88 mg/dL (ref <150)

## 2017-09-24 LAB — PHOSPHORUS
Phosphorus: 2.7 mg/dL (ref 2.5–4.6)
Phosphorus: 2.3 mg/dL — ABNORMAL LOW (ref 2.5–4.6)

## 2017-09-24 SURGERY — LAPAROTOMY, EXPLORATORY
Anesthesia: General | Site: Abdomen

## 2017-09-24 MED ORDER — ACETAMINOPHEN 10 MG/ML IV SOLN
1000.0000 mg | Freq: Once | INTRAVENOUS | Status: AC
  Administered 2017-09-24: 1000 mg via INTRAVENOUS
  Filled 2017-09-24: qty 100

## 2017-09-24 MED ORDER — IPRATROPIUM BROMIDE 0.02 % IN SOLN
0.5000 mg | Freq: Four times a day (QID) | RESPIRATORY_TRACT | Status: DC
  Administered 2017-09-24: 0.5 mg via RESPIRATORY_TRACT
  Filled 2017-09-24: qty 2.5

## 2017-09-24 MED ORDER — DILTIAZEM HCL 100 MG IV SOLR
5.0000 mg/h | INTRAVENOUS | Status: DC
  Administered 2017-09-24: 15 mg/h via INTRAVENOUS
  Filled 2017-09-24 (×2): qty 100

## 2017-09-24 MED ORDER — LEVALBUTEROL HCL 1.25 MG/0.5ML IN NEBU
1.2500 mg | INHALATION_SOLUTION | Freq: Four times a day (QID) | RESPIRATORY_TRACT | Status: DC
  Administered 2017-09-24: 1.25 mg via RESPIRATORY_TRACT
  Filled 2017-09-24 (×4): qty 0.5

## 2017-09-24 MED ORDER — LIDOCAINE 2% (20 MG/ML) 5 ML SYRINGE
INTRAMUSCULAR | Status: DC | PRN
  Administered 2017-09-24: 60 mg via INTRAVENOUS

## 2017-09-24 MED ORDER — DILTIAZEM LOAD VIA INFUSION
INTRAVENOUS | Status: DC | PRN
  Administered 2017-09-24 (×2): 2.5 mg via INTRAVENOUS

## 2017-09-24 MED ORDER — LIDOCAINE 2% (20 MG/ML) 5 ML SYRINGE
INTRAMUSCULAR | Status: AC
  Filled 2017-09-24: qty 5

## 2017-09-24 MED ORDER — PROPOFOL 10 MG/ML IV BOLUS
INTRAVENOUS | Status: AC
  Filled 2017-09-24: qty 20

## 2017-09-24 MED ORDER — CEFOTETAN DISODIUM-DEXTROSE 2-2.08 GM-%(50ML) IV SOLR
INTRAVENOUS | Status: AC
  Filled 2017-09-24: qty 50

## 2017-09-24 MED ORDER — LACTATED RINGERS IV SOLN
INTRAVENOUS | Status: DC | PRN
  Administered 2017-09-24: 11:00:00 via INTRAVENOUS

## 2017-09-24 MED ORDER — SUGAMMADEX SODIUM 200 MG/2ML IV SOLN
INTRAVENOUS | Status: AC
  Filled 2017-09-24: qty 2

## 2017-09-24 MED ORDER — EPHEDRINE 5 MG/ML INJ
INTRAVENOUS | Status: AC
  Filled 2017-09-24: qty 10

## 2017-09-24 MED ORDER — SUCCINYLCHOLINE CHLORIDE 200 MG/10ML IV SOSY
PREFILLED_SYRINGE | INTRAVENOUS | Status: DC | PRN
  Administered 2017-09-24: 100 mg via INTRAVENOUS

## 2017-09-24 MED ORDER — ALBUMIN HUMAN 5 % IV SOLN
INTRAVENOUS | Status: DC | PRN
Start: 2017-09-24 — End: 2017-09-24
  Administered 2017-09-24 (×2): via INTRAVENOUS

## 2017-09-24 MED ORDER — LACTATED RINGERS IV SOLN
INTRAVENOUS | Status: DC | PRN
Start: 2017-09-24 — End: 2017-09-24
  Administered 2017-09-24 (×2): via INTRAVENOUS

## 2017-09-24 MED ORDER — MAGNESIUM SULFATE 2 GM/50ML IV SOLN
2.0000 g | Freq: Once | INTRAVENOUS | Status: AC
  Administered 2017-09-25: 2 g via INTRAVENOUS

## 2017-09-24 MED ORDER — IPRATROPIUM BROMIDE 0.02 % IN SOLN: 0.5000 mg | RESPIRATORY_TRACT | Status: DC | PRN

## 2017-09-24 MED ORDER — HYDROMORPHONE HCL 1 MG/ML IJ SOLN
0.2500 mg | INTRAMUSCULAR | Status: DC | PRN
  Administered 2017-09-24 (×2): 0.5 mg via INTRAVENOUS

## 2017-09-24 MED ORDER — HYDROMORPHONE HCL 1 MG/ML IJ SOLN
INTRAMUSCULAR | Status: AC
  Administered 2017-09-24: 0.5 mg via INTRAVENOUS
  Filled 2017-09-24: qty 1

## 2017-09-24 MED ORDER — KETOROLAC TROMETHAMINE 15 MG/ML IJ SOLN
15.0000 mg | Freq: Once | INTRAMUSCULAR | Status: AC
  Administered 2017-09-24: 15 mg via INTRAVENOUS
  Filled 2017-09-24: qty 1

## 2017-09-24 MED ORDER — MEPERIDINE HCL 25 MG/ML IJ SOLN: 6.2500 mg | INTRAMUSCULAR | Status: DC | PRN

## 2017-09-24 MED ORDER — ROCURONIUM BROMIDE 10 MG/ML (PF) SYRINGE
PREFILLED_SYRINGE | INTRAVENOUS | Status: AC
  Filled 2017-09-24: qty 5

## 2017-09-24 MED ORDER — SUCCINYLCHOLINE CHLORIDE 200 MG/10ML IV SOSY
PREFILLED_SYRINGE | INTRAVENOUS | Status: AC
  Filled 2017-09-24: qty 10

## 2017-09-24 MED ORDER — PHENYLEPHRINE 40 MCG/ML (10ML) SYRINGE FOR IV PUSH (FOR BLOOD PRESSURE SUPPORT)
PREFILLED_SYRINGE | INTRAVENOUS | Status: DC | PRN
  Administered 2017-09-24 (×2): 120 ug via INTRAVENOUS

## 2017-09-24 MED ORDER — SUGAMMADEX SODIUM 200 MG/2ML IV SOLN
INTRAVENOUS | Status: DC | PRN
  Administered 2017-09-24: 200 mg via INTRAVENOUS

## 2017-09-24 MED ORDER — DEXTROSE 5 % IV SOLN
10.0000 mmol | Freq: Once | INTRAVENOUS | Status: AC
  Administered 2017-09-24: 10 mmol via INTRAVENOUS
  Filled 2017-09-24: qty 3.33

## 2017-09-24 MED ORDER — ONDANSETRON HCL 4 MG/2ML IJ SOLN
INTRAMUSCULAR | Status: DC | PRN
  Administered 2017-09-24 (×2): 4 mg via INTRAVENOUS

## 2017-09-24 MED ORDER — ONDANSETRON HCL 4 MG/2ML IJ SOLN
INTRAMUSCULAR | Status: AC
  Filled 2017-09-24: qty 2

## 2017-09-24 MED ORDER — TRAVASOL 10 % IV SOLN
INTRAVENOUS | Status: AC
  Administered 2017-09-24: 17:00:00 via INTRAVENOUS
  Filled 2017-09-24: qty 904.8

## 2017-09-24 MED ORDER — DEXTROSE 5 % IV SOLN
INTRAVENOUS | Status: AC
  Filled 2017-09-24: qty 1

## 2017-09-24 MED ORDER — 0.9 % SODIUM CHLORIDE (POUR BTL) OPTIME
TOPICAL | Status: DC | PRN
  Administered 2017-09-24 (×3): 1000 mL

## 2017-09-24 MED ORDER — FENTANYL CITRATE (PF) 250 MCG/5ML IJ SOLN
INTRAMUSCULAR | Status: AC
  Filled 2017-09-24: qty 5

## 2017-09-24 MED ORDER — PROPOFOL 10 MG/ML IV BOLUS
INTRAVENOUS | Status: DC | PRN
  Administered 2017-09-24: 110 mg via INTRAVENOUS

## 2017-09-24 MED ORDER — FENTANYL CITRATE (PF) 100 MCG/2ML IJ SOLN
INTRAMUSCULAR | Status: DC | PRN
  Administered 2017-09-24: 25 ug via INTRAVENOUS
  Administered 2017-09-24: 50 ug via INTRAVENOUS
  Administered 2017-09-24: 25 ug via INTRAVENOUS
  Administered 2017-09-24: 125 ug via INTRAVENOUS
  Administered 2017-09-24: 25 ug via INTRAVENOUS

## 2017-09-24 MED ORDER — LACTATED RINGERS IV SOLN
INTRAVENOUS | Status: DC
  Administered 2017-09-24: 10:00:00 via INTRAVENOUS

## 2017-09-24 MED ORDER — PHENYLEPHRINE HCL 10 MG/ML IJ SOLN
INTRAVENOUS | Status: DC | PRN
  Administered 2017-09-24: 50 ug/min via INTRAVENOUS

## 2017-09-24 MED ORDER — DEXAMETHASONE SODIUM PHOSPHATE 10 MG/ML IJ SOLN
INTRAMUSCULAR | Status: AC
  Filled 2017-09-24: qty 1

## 2017-09-24 MED ORDER — ROCURONIUM BROMIDE 100 MG/10ML IV SOLN
INTRAVENOUS | Status: DC | PRN
  Administered 2017-09-24: 40 mg via INTRAVENOUS
  Administered 2017-09-24: 20 mg via INTRAVENOUS
  Administered 2017-09-24 (×2): 10 mg via INTRAVENOUS

## 2017-09-24 MED ORDER — LEVALBUTEROL HCL 0.63 MG/3ML IN NEBU
0.6300 mg | INHALATION_SOLUTION | RESPIRATORY_TRACT | Status: DC | PRN
  Administered 2017-09-24: 0.63 mg via RESPIRATORY_TRACT
  Filled 2017-09-24: qty 3

## 2017-09-24 MED ORDER — PHENYLEPHRINE 40 MCG/ML (10ML) SYRINGE FOR IV PUSH (FOR BLOOD PRESSURE SUPPORT)
PREFILLED_SYRINGE | INTRAVENOUS | Status: AC
  Filled 2017-09-24: qty 10

## 2017-09-24 MED ORDER — DILTIAZEM HCL 100 MG IV SOLR
INTRAVENOUS | Status: DC | PRN
  Administered 2017-09-24: 15 mg/h via INTRAVENOUS

## 2017-09-24 MED ORDER — METOCLOPRAMIDE HCL 5 MG/ML IJ SOLN: 10.0000 mg | Freq: Once | INTRAMUSCULAR | Status: DC | PRN

## 2017-09-24 SURGICAL SUPPLY — 45 items
BLADE CLIPPER SURG (BLADE) IMPLANT
CANISTER SUCT 3000ML PPV (MISCELLANEOUS) ×6 IMPLANT
CANISTER WOUND CARE 500ML ATS (WOUND CARE) ×3 IMPLANT
CHLORAPREP W/TINT 26ML (MISCELLANEOUS) ×3 IMPLANT
CONT SPEC 4OZ CLIKSEAL STRL BL (MISCELLANEOUS) ×6 IMPLANT
COVER MAYO STAND STRL (DRAPES) ×3 IMPLANT
COVER SURGICAL LIGHT HANDLE (MISCELLANEOUS) ×3 IMPLANT
DRAPE LAPAROSCOPIC ABDOMINAL (DRAPES) ×3 IMPLANT
DRAPE WARM FLUID 44X44 (DRAPE) ×3 IMPLANT
DRSG OPSITE POSTOP 4X10 (GAUZE/BANDAGES/DRESSINGS) IMPLANT
DRSG OPSITE POSTOP 4X8 (GAUZE/BANDAGES/DRESSINGS) IMPLANT
ELECT BLADE 6.5 EXT (BLADE) ×3 IMPLANT
ELECT CAUTERY BLADE 6.4 (BLADE) ×3 IMPLANT
ELECT REM PT RETURN 9FT ADLT (ELECTROSURGICAL) ×3
ELECTRODE REM PT RTRN 9FT ADLT (ELECTROSURGICAL) ×1 IMPLANT
GLOVE BIO SURGEON STRL SZ7 (GLOVE) ×3 IMPLANT
GLOVE BIOGEL PI IND STRL 7.5 (GLOVE) ×1 IMPLANT
GLOVE BIOGEL PI INDICATOR 7.5 (GLOVE) ×2
GOWN STRL REUS W/ TWL LRG LVL3 (GOWN DISPOSABLE) ×2 IMPLANT
GOWN STRL REUS W/TWL LRG LVL3 (GOWN DISPOSABLE) ×4
KIT BASIN OR (CUSTOM PROCEDURE TRAY) ×3 IMPLANT
KIT OSTOMY DRAINABLE 2.75 STR (WOUND CARE) ×3 IMPLANT
KIT PREVENA INCISION MGT20CM45 (CANNISTER) ×3 IMPLANT
KIT ROOM TURNOVER OR (KITS) ×3 IMPLANT
LIGASURE IMPACT 36 18CM CVD LR (INSTRUMENTS) ×3 IMPLANT
NS IRRIG 1000ML POUR BTL (IV SOLUTION) ×9 IMPLANT
PACK GENERAL/GYN (CUSTOM PROCEDURE TRAY) ×3 IMPLANT
PAD ARMBOARD 7.5X6 YLW CONV (MISCELLANEOUS) ×3 IMPLANT
RELOAD PROXIMATE 75MM BLUE (ENDOMECHANICALS) ×9 IMPLANT
SPECIMEN JAR LARGE (MISCELLANEOUS) ×6 IMPLANT
SPONGE LAP 18X18 X RAY DECT (DISPOSABLE) ×3 IMPLANT
STAPLER GUN LINEAR PROX 60 (STAPLE) ×3 IMPLANT
STAPLER PROXIMATE 75MM BLUE (STAPLE) ×3 IMPLANT
STAPLER VISISTAT 35W (STAPLE) ×3 IMPLANT
SUCTION POOLE TIP (SUCTIONS) ×3 IMPLANT
SUT PDS AB 1 TP1 96 (SUTURE) ×6 IMPLANT
SUT SILK 2 0 SH CR/8 (SUTURE) ×3 IMPLANT
SUT SILK 2 0 TIES 10X30 (SUTURE) ×3 IMPLANT
SUT SILK 3 0 SH CR/8 (SUTURE) ×3 IMPLANT
SUT SILK 3 0 TIES 10X30 (SUTURE) ×3 IMPLANT
SUT VIC AB 3-0 SH 18 (SUTURE) ×3 IMPLANT
TOWEL NATURAL 6PK STERILE (DISPOSABLE) ×6 IMPLANT
TOWEL OR 17X26 10 PK STRL BLUE (TOWEL DISPOSABLE) ×3 IMPLANT
TRAY FOLEY W/METER SILVER 16FR (SET/KITS/TRAYS/PACK) IMPLANT
YANKAUER SUCT BULB TIP NO VENT (SUCTIONS) IMPLANT

## 2017-09-24 NOTE — Plan of Care (Signed)
  Education: Knowledge of General Education information will improve 09/24/2017 0645 - Progressing by Anson Fret, RN Note Pt. Informed of surgery today and consent signed.

## 2017-09-24 NOTE — Consult Note (Signed)
PULMONARY / CRITICAL CARE MEDICINE   Name: Terri [name] MRN: [id number] DOB: 22-Oct-1931    ADMISSION DATE:  09/16/2017 CONSULTATION DATE: 09/24/17  REFERRING MD:  Dr. 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    The patient has macular degeneration, rheumatic fever, HTN, TIA, hypothyroidism, Chron's disease, GERD, duodenal ulcer, SBO (05/2017) which resolved with conservative therapy (NGT and steroids).  Biologics were discussed during the July admission but there was question if she would be a candidate due to positive Quantiferon Gold.  She was discharged on a prednisone taper.  After the admission in July, she had a subsequent CT of the abdomen 10/23 (assume follow up per Dr. Watt Climes of [facility], can not see his documentation in EPIC) which was suggestive of omental nodularity worrisome for malignancy.  She was planned for outpatient biopsy per IR on 11/7 but was admitted before the appointment.   She returns 11/4 with increased nausea / vomiting and poor PO intake.  CCS consulted.  The patient's nutritional & volume status was addressed prior to surgery.  The patient was taken to the OR on 11/12 for exploratory laparotomy, omentectomy, resection of ileum/cecum in the setting of recurrent small bowel obstruction, concern for malignancy.  The foreign body previously identified on CT was removed and noted to be desiccant from a pill bottle.  Extensive peritoneal implants were noted.  Frozen section confirmed the peritoneal implants were adenocarcinoma.  Intraoperative course complicated by AF with RVR.  She was subsequently started on a cardizem gtt and transferred to ICU.  Extubated in PACU prior to transfer.    PCCM consulted for evaluation.    PAST MEDICAL HISTORY :  She  has a past medical history of Age-related macular  degeneration, dry, both eyes, Aortic insufficiency, Aortic stenosis, Arthritis, Asthma, Basal cell carcinoma of nose, Crohn's disease ([place]), GERD (gastroesophageal reflux disease), History of duodenal ulcer, History of stomach ulcers, HOH (hard of hearing), rheumatic fever, Hypertension, Hypothyroidism, Positive TB test, Rheumatic heart disease, Small bowel obstruction ([place]) (05/31/2017), Squamous cell cancer of skin of nose, TIA (transient ischemic attack), and TIA (transient ischemic attack) (1999?).  PAST SURGICAL HISTORY: She  has a past surgical history that includes Cholecystectomy; Tonsillectomy; Carpal tunnel release (Right); Excision basal cell carcinoma (X 2); Squamous cell carcinoma excision (X1); and Cataract extraction w/ intraocular lens implant (Bilateral).  Allergies  Allergen Reactions  . Lidocaine     Claims that it made her heart beat go irregular    No current facility-administered medications on file prior to encounter.    Current Outpatient Medications on File Prior to Encounter  Medication Sig  . amoxicillin-clavulanate (AUGMENTIN) 875-125 MG tablet Take 1 tablet 2 (two) times daily by mouth.  . carboxymethylcellulose (REFRESH PLUS) 0.5 % SOLN Place 1 drop into both eyes daily as needed (dry eyes).  . clidinium-chlordiazePOXIDE (LIBRAX) 5-2.5 MG capsule Take 1 capsule daily by mouth.   . clopidogrel (PLAVIX) 75 MG tablet Take 75 mg by mouth daily.  . folic acid (FOLVITE) 1 MG tablet Take 1 mg by mouth daily.  Marland Kitchen levothyroxine (SYNTHROID, LEVOTHROID) 100 MCG tablet Take 100 mcg by mouth daily before breakfast.  . losartan (COZAAR) 50 MG tablet Take 50 mg by mouth daily.  . Multiple Vitamin (MULTIVITAMIN WITH MINERALS) TABS tablet Take 1 tablet by mouth  daily.  . ondansetron (ZOFRAN) 4 MG tablet Take 1 tablet (4 mg total) by mouth every 6 (six) hours as needed for nausea.  Marland Kitchen oxybutynin (DITROPAN) 5 MG tablet Take 5 mg by mouth daily.  . pantoprazole (PROTONIX) 40 MG  tablet Take 40 mg by mouth every morning.  . predniSONE (DELTASONE) 10 MG tablet Take 10 mg daily with breakfast by mouth.  . sodium chloride (OCEAN) 0.65 % SOLN nasal spray Place 2 sprays as needed into both nostrils for congestion.  . predniSONE (DELTASONE) 20 MG tablet Take 3 tablets (60 mg total) by mouth daily before breakfast. (Patient not taking: Reported on 09/17/2017)  . predniSONE (DELTASONE) 50 MG tablet Take this course after 7/29    FAMILY HISTORY:  Her indicated that the status of her brother is unknown. She indicated that the status of her daughter is unknown. She indicated that the status of her neg hx is unknown.   SOCIAL HISTORY: She  reports that she quit smoking about 49 years ago. Her smoking use included cigarettes. She has a 1.50 pack-year smoking history. she has never used smokeless tobacco. She reports that she does not drink alcohol or use drugs.  REVIEW OF SYSTEMS:  Unable to complete as patient is altered on mechanical ventilation. Information obtained from prior medical documentation / staff at bedside.   SUBJECTIVE:  Pt reports pain.   VITAL SIGNS: BP 122/74 (BP Location: Left Arm)   Pulse 80   Temp 97.7 F (36.5 C)   Resp (!) 23   Ht 5\' 2"  (1.575 m)   Wt 182 lb 4.8 oz (82.7 kg)   SpO2 93%   BMI 33.34 kg/m   HEMODYNAMICS:    VENTILATOR SETTINGS:    INTAKE / OUTPUT: I/O last 3 completed shifts: In: 7035 [I.V.:2140; Other:1000] Out: 88 [Urine:600; Emesis/NG output:125]  PHYSICAL EXAMINATION: General:  Elderly female in NAD, returned from OR in bed  HEENT: MM pink/moists, NGT Neuro: AAOx4, speech clear, MAE  CV: s1s2 rrr, no m/r/g PULM: even/non-labored, lungs bilaterally with wheezing  GI: abd wound VAC midline, RUQ ileostomy with stool, stoma pink, tender to palpation Extremities: warm/dry, no edema  Skin: no rashes or lesions  LABS:  BMET Recent Labs  Lab 09/22/17 0452 09/23/17 0452 09/24/17 0431  NA 138 138 137  K 3.9 4.1 4.2   CL 106 105 103  CO2 26 28 28   BUN 19 24* 29*  CREATININE 1.01* 0.90 0.81  GLUCOSE 139* 133* 118*    Electrolytes Recent Labs  Lab 09/22/17 0452 09/23/17 0452 09/24/17 0431  CALCIUM 8.1* 8.0* 8.1*  MG 1.3* 2.0 1.8  PHOS 3.2  --  2.7    CBC Recent Labs  Lab 09/22/17 0452 09/23/17 0452 09/24/17 0431  WBC 10.6* 13.6* 12.2*  HGB 10.1* 10.2* 10.2*  HCT 31.9* 31.9* 32.4*  PLT 181 176 152    Coag's Recent Labs  Lab 09/18/17 0558  INR 1.19    Sepsis Markers No results for input(s): LATICACIDVEN, PROCALCITON, O2SATVEN in the last 168 hours.  ABG No results for input(s): PHART, PCO2ART, PO2ART in the last 168 hours.  Liver Enzymes Recent Labs  Lab 09/21/17 0353 09/22/17 0452 09/24/17 0431  AST 16 15 15   ALT 11* 11* 10*  ALKPHOS 45 45 45  BILITOT 0.5 0.4 0.5  ALBUMIN 2.5* 2.5* 2.6*    Cardiac Enzymes No results for input(s): TROPONINI, PROBNP in the last 168 hours.  Glucose Recent Labs  Lab 09/23/17 0537 09/23/17 1222 09/23/17  1802 09/24/17 0015 09/24/17 0618 09/24/17 0958  GLUCAP 138* 156* 191* 146* 109* 119*    Imaging No results found.   STUDIES:  CT ABD/Pelvis 7/19 >> multifocal areas of wall thickening with narrowing and/or stricturing of the ileum, secondary small bowel obstruction with multiple dilated fluid-filled loops of small bowel proximally, small volume free fluid within the abdomen, likely reactive, atrophic kidneys bilaterally with prominent cortical scarring, slightly worse on the left CT ABD/Pelvis 10/23 >> extensive mural thickening & mucosal hyper-enhancement throughout the distal & terminal ileum, unusual soft tissue nodularity noted throughout the omentum / concern for malignancy, previously noted intraluminal form body remains in position in the terminal ileum. This is 8 dense cylindrical object measuring 16x 13 x 13 mm. CT ABD/Pelvis 11/4 >> findings consistent with high grade mechanical small bowel obstruction, transition  point visualized in the RLQ/pelvis, negative for free air, re-demonstrated 39mm rectangular foreign object in the terminal ileum, mild nodularity of the omentum  CULTURES: MRSA PCR 11/11 >> negative   ANTIBIOTICS: Cefotetan 11/12 x1 (OR)  SIGNIFICANT EVENTS: 11/04  Admit  11/12  OR for ex-lap, patho consistent with metastatic adenocarcinoma.  AFwRVR  LINES/TUBES: RUE PICC 11/9 >>   DISCUSSION: 83 y/o F with PMH of Chron's disease and recent SBO (05/2017) admitted 11/4 with nausea/vomiting.  Recent CT findings concerning for malignancy.  She underwent exploratory laparotomy on 11/12 which revealed omental caking and pathology consistent with metastatic adenocarcinoma.  Intraoperative course complicated by [facility], returned to ICU extubated 11/12.    ASSESSMENT / PLAN:  PULMONARY A: At Risk Aspiration - s/p anesthesia, extubated in PACU  Asthma  P:   Aspiration precautions NPO Solumedrol 60 mg IV QD  Xopenex + Atrovent Q6  PRN Xopenex  Incentive spirometry, mobilize as able   CARDIOVASCULAR A:  AF with RVR HX HTN, Rheumatic Fever, AS/AI P:  ICU monitoring  Cardizem gtt  Continue IV lopressor 2.5mg  Q6 Follow triglycerides while on TPN  RENAL A:   At Risk AKI  P:   Repeat labs now post-op  Trend BMP / urinary output Replace electrolytes as indicated Avoid nephrotoxic agents as able, ensure adequate renal perfusion  GASTROINTESTINAL A:   Metastatic Adenocarcinoma s/p Ex-Lap 11/12 with omentectomy, resection of ileum/cecum, ileostomy Nausea / Vomiting / Abdominal Pain  Hx Chron's Disease, SBO (2018) P:   NPO TNA per pharmacy  Palliative Care consult for [place]  Ostomy care per [place] / [facility]  PPI in setting of critical illness  GI / [facility] following   HEMATOLOGIC A:   Anemia  P:  Enoxaparin for DVT prophylaxis  Trend CBC  Monitor for bleeding   INFECTIOUS A:   Post-Op Ex-Lap 11/12  P:   Monitor fever curve / WBC trend post-op  ABX as above   ENDOCRINE A:    Hypothyroidism    Hyperglycemia  P:   Assess TSH in AM  Continue IV synthroid  SSI with moderate scale   NEUROLOGIC A:   Post-Operative Pain  P:   RASS goal: n/a  PRN morphine for pain  Supportive care    FAMILY  - Updates: No family at bedside.  Patient updated on plan of care.  Will consult palliative care to assist with [place] discussion.    - Inter-disciplinary family meet or Palliative Care meeting due by:  11/19   Noe Gens, NP-C [place] Pgr: [phone] or if no answer [phone] 09/24/2017, 2:20 PM

## 2017-09-24 NOTE — Progress Notes (Signed)
CC:  SBO  Subjective: She is OK, just scared, no family with her.  No real change.    Objective: Vital signs in last 24 hours: Temp:  [97.7 F (36.5 C)-97.8 F (36.6 C)] 97.7 F (36.5 C) (11/12 0551) Pulse Rate:  [78-119] 119 (11/12 0551) Resp:  [18-20] 20 (11/12 0551) BP: (123-150)/(68-91) 137/80 (11/12 0551) SpO2:  [93 %-98 %] 97 % (11/12 0551) Weight:  [82.7 kg (182 lb 4.8 oz)] 82.7 kg (182 lb 4.8 oz) (11/11 1852) Last BM Date: 09/23/17 NPO 2140 IV 600 urine ND 125 Afebrile, VSS, some mild tachycardia Labs OK ABXray 09/23/17:  Re- demonstrated marked gas distention of several centralized loops of rather patulous appearing small bowel with index loop within the left mid hemiabdomen measuring approximately 5.5 cm in diameter, unchanged. These findings are again associated with a conspicuous paucity of distal colonic gas. Nondiagnostic evaluation for pneumoperitoneum secondary supine positioning and exclusion of the lower thorax. No pneumatosis or portal venous gas. Enteric tube tip and side port projected the expected location of the gastric fundus.  Several phleboliths overlie the left hemipelvis. Otherwise, no definitive abnormal intra-abdominal calcifications.   Intake/Output from previous day: 11/11 0701 - 11/12 0700 In: 3140 [I.V.:2140] Out: 725 [Urine:600; Emesis/NG output:125] Intake/Output this shift: No intake/output data recorded.  General appearance: alert, cooperative, no distress and very anxious about surgery Resp: clear to auscultation bilaterally GI: mildly distended, few BS, not really tender.    Lab Results:  Recent Labs    09/23/17 0452 09/24/17 0431  WBC 13.6* 12.2*  HGB 10.2* 10.2*  HCT 31.9* 32.4*  PLT 176 152    BMET Recent Labs    09/23/17 0452 09/24/17 0431  NA 138 137  K 4.1 4.2  CL 105 103  CO2 28 28  GLUCOSE 133* 118*  BUN 24* 29*  CREATININE 0.90 0.81  CALCIUM 8.0* 8.1*   PT/INR No results for input(s): LABPROT,  INR in the last 72 hours.  Recent Labs  Lab 09/21/17 0353 09/22/17 0452 09/24/17 0431  AST 16 15 15   ALT 11* 11* 10*  ALKPHOS 45 45 45  BILITOT 0.5 0.4 0.5  PROT 4.9* 5.0* 5.0*  ALBUMIN 2.5* 2.5* 2.6*     Lipase     Component Value Date/Time   LIPASE 17 09/16/2017 1507     Medications: . enoxaparin (LOVENOX) injection  40 mg Subcutaneous Q24H  . insulin aspart  0-15 Units Subcutaneous Q6H  . levothyroxine  50 mcg Intravenous Daily  . mouth rinse  15 mL Mouth Rinse BID  . methylPREDNISolone (SOLU-MEDROL) injection  60 mg Intravenous Daily  . metoprolol tartrate  2.5 mg Intravenous Q6H  . pantoprazole (PROTONIX) IV  40 mg Intravenous Q24H   . sodium chloride 10 mL/hr at 09/23/17 1252  . cefoTEtan (CEFOTAN) IV    . TPN ADULT (ION) 65 mL/hr at 09/23/17 1812   Anti-infectives (From admission, onward)   Start     Dose/Rate Route Frequency Ordered Stop   09/24/17 0800  cefoTEtan (CEFOTAN) 1 g in dextrose 5 % 50 mL IVPB     1 g 100 mL/hr over 30 Minutes Intravenous To Short Stay 09/23/17 2310 09/25/17 0800      Assessment/Plan Recurrent small bowel obstruction Crohn's disease with inflammation of the distal terminal ileum/foreign object distal terminal ileum. Medical management currently - Methylprednisolone Abnormal omental pattern on CT/biopsycould not be done secondary to SBO and edema BX @[facility]  09/19/17 Malnutrition - Prealbumin 11.3:  PICC line/TNA  starting 09/21/17  per Dr. Tana Coast => prealbumin up to 12.4 ? Fluid overload - CXR with PICC; Dr. Tana Coast adjusting fluids History of GERD/duodenal ulcers- Protonix Mild Renal insuffiencey- creatinine 1.24 - 1.11  0.81 today Reported mild aortic stenosis. Hypertension Hypothyroid-synthroid Remote history of rheumatic heart disease.  History of tobacco use History of TIA.  Fen: N.p.o./IV fluids/TNA ID: None - Cefotetan on call to OR DVT: SCD/Lovenox   Plan:  For Surgery today.         LOS: 8 days     Terri [name] 09/24/2017 [phone]

## 2017-09-24 NOTE — Transfer of Care (Signed)
Immediate Anesthesia Transfer of Care Note  Patient: Terri [name]  Procedure(s) Performed: EXPLORATORY LAPAROTOMY (N/A Abdomen) OMENTECTOMY (N/A Abdomen) ILEOCECETOMY (N/A Abdomen) ILEOSTOMY (N/A Abdomen)  Patient Location: PACU  Anesthesia Type:General  Level of Consciousness: oriented, drowsy and patient cooperative  Airway & Oxygen Therapy: Patient Spontanous Breathing and Patient connected to face mask oxygen  Post-op Assessment: Report given to RN and Post -op Vital signs reviewed and stable  Post vital signs: Reviewed and stable  Last Vitals:  Vitals:   09/24/17 0954 09/24/17 1355  BP: 127/74 122/74  Pulse: (!) 118 93  Resp: 20 (!) 26  Temp: 36.7 C 36.5 C  SpO2: 93% 97%    Last Pain:  Vitals:   09/24/17 0954  TempSrc: Oral  PainSc:       Patients Stated Pain Goal: 4 (16/10/96 0454)  Complications: No apparent anesthesia complications

## 2017-09-24 NOTE — Progress Notes (Signed)
--   No charge note --  - Patient seen at bedside. Chart reviewed. Plan is for exploratory laparotomy with possible small bowel resection today. - GI will follow up tomorrow  Otis Brace MD, FACP 09/24/2017, 8:58 AM  Contact #  [phone]

## 2017-09-24 NOTE — Anesthesia Preprocedure Evaluation (Addendum)
Anesthesia Evaluation  Patient identified by MRN, date of birth, ID band Patient awake    Reviewed: Allergy & Precautions, NPO status , Patient's Chart, lab work & pertinent test results  Airway Mallampati: II  TM Distance: >3 FB Neck ROM: Full    Dental  (+) Teeth Intact, Poor Dentition, Missing   Pulmonary asthma , former smoker,    Pulmonary exam normal breath sounds clear to auscultation       Cardiovascular hypertension, Pt. on medications Normal cardiovascular exam+ Valvular Problems/Murmurs AI  Rhythm:Regular Rate:Normal  Echo 9/19-Left ventricle: The cavity size was normal. Systolic function was normal. The estimated ejection fraction was in the range of 55% to 60%. Wall motion was normal; there were no regional wall motion abnormalities. Doppler parameters are consistent with both elevated ventricular end-diastolic filling pressure and elevated left atrial filling pressure. - Aortic valve: There was mild regurgitation. - Left atrium: The atrium was mildly dilated. - Atrial septum: No defect or patent foramen ovale was identified. - Pulmonary arteries: PA peak pressure: 31 mm Hg (S).   Neuro/Psych TIAnegative psych ROS   GI/Hepatic GERD  Medicated and Controlled,Crohn's disease Small Bowel Obstruction   Endo/Other  Hypothyroidism   Renal/GU Renal diseaseHx/o AKI  negative genitourinary   Musculoskeletal  (+) Arthritis ,   Abdominal (+) + obese,   Peds  Hematology   Anesthesia Other Findings   Reproductive/Obstetrics                           Anesthesia Physical Anesthesia Plan  ASA: III  Anesthesia Plan: General   Post-op Pain Management:    Induction: Intravenous  PONV Risk Score and Plan: 4 or greater and Ondansetron  Airway Management Planned: Oral ETT  Additional Equipment:   Intra-op Plan:   Post-operative Plan: Extubation in OR  Informed Consent: I have reviewed  the patients History and Physical, chart, labs and discussed the procedure including the risks, benefits and alternatives for the proposed anesthesia with the patient or authorized representative who has indicated his/her understanding and acceptance.   Dental advisory given  Plan Discussed with: CRNA, Anesthesiologist and Surgeon  Anesthesia Plan Comments:         Anesthesia Quick Evaluation

## 2017-09-24 NOTE — Progress Notes (Signed)
Oncology consulted for evaluation.  Dr. Benay Spice on call, appreciate input.  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    Noe Gens, NP-C [place] [facility] Pgr: [phone] or if no answer [phone] 09/24/2017, 3:50 PM

## 2017-09-24 NOTE — Progress Notes (Signed)
Received from OR with Diltiazem at 20mg /hr, TPN at 58ml/hr. via PICC line.

## 2017-09-24 NOTE — Progress Notes (Addendum)
[facility]                                                                              Patient Demographics  Terri [name], is a 83 y.o. female, DOB - October 19, 1931, HKV:[id number]  Admit date - 09/16/2017   Admitting Physician Etta Quill, DO  Outpatient Primary MD for the patient is [name], [name], MD  Outpatient specialists:   LOS - 8  days   Medical records reviewed and are as summarized below:    Chief Complaint  Patient presents with  . Abdominal Pain  . Nausea  . Emesis       Brief summary   HPI on 09/16/2017 by Dr. Jolene Schimke Braughtonis a 83 y.o.femalewith medical history significant ofCrohn's disease diagnosed x5 years ago. SBO in July this year felt to be secondary to Crohn's disease. SBO resolved / improved after NGT and steroids though she has been having some abdominal symptoms in the intervening time. Patient presents to the ED with c/o nausea, abd distention, concern for another SBO. Of note patient just had CT scan on 10/23 which showed findings suggestive of Crohns, omental nodularity worrisome for malignancy (patient had biopsy scheduled at [facility] on Wed this week), and foreign body in terminal ilium, possibly a pill or tablet that has been present since CT scan in July of this year     Assessment & Plan    Principal Problem:   SBO (small bowel obstruction) ([place]) -CT abdomen showed high-grade mechanical SBO with transition point in the right lower quadrant/pelvis and related to disease segment of terminal ileum demonstrating wall thickening and possible stricturing -Continue NPO/NGT, surgery following, PICC line placed, started TPN - abd xrays persistently showed high-grade SBO, plan for surgery today  Addendum: Called by Dr Georgette Dover, patient had SBO due to metastatic adenoCa (new), s/p colon resection, ileostomy. Intraop in Afib with RVR, not extubated, started on cardizem drip. Going to ICU, 77M. Discussed with Dr  Jimmey Ralph (CCM) will see patient in 77M.  Also consulted cardiology.     Active Problems: Omental nodularity - IR was consulted, discussed with Dr. Tresea Mall, unable to do-the omental biopsy due to significant reactive inflammation and SBO.  Recommended outpatient biopsy after the acute hospitalization is resolved    Crohn's disease ([place]) -GI consulted, ID was consulted by GI for possible Remicade use.  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  Patient had completed 6 months of INH -  ID felt no other therapy indicated prior to considering of biological agents.    Foreign body of small intestine, subsequent encounter -Noted on CT abdomen pelvis 17 mm firm object in the terminal ileum, General surgery following  Hypothyroidism Continue Synthroid  Dyspnea/wheezing Improved  Code Status: Full CODE STATUS DVT Prophylaxis:  Lovenox  Family Communication: Discussed in detail with the patient, all imaging results, lab results explained to the patient    Disposition Plan: OR today   Time Spent in minutes  15 minutes  Procedures:    Consultants:   General surgery GI IR  Antimicrobials:      Medications  Scheduled Meds: . [MAR Hold] enoxaparin (LOVENOX) injection  40  mg Subcutaneous Q24H  . [MAR Hold] insulin aspart  0-15 Units Subcutaneous Q6H  . [MAR Hold] levothyroxine  50 mcg Intravenous Daily  . [MAR Hold] mouth rinse  15 mL Mouth Rinse BID  . [MAR Hold] methylPREDNISolone (SOLU-MEDROL) injection  60 mg Intravenous Daily  . [MAR Hold] metoprolol tartrate  2.5 mg Intravenous Q6H  . ondansetron      . [MAR Hold] pantoprazole (PROTONIX) IV  40 mg Intravenous Q24H   Continuous Infusions: . sodium chloride 10 mL/hr at 09/23/17 1252  . [MAR Hold] cefoTEtan (CEFOTAN) IV    . lactated ringers 10 mL/hr at 09/24/17 1020  . TPN ADULT (ION) 65 mL/hr at 09/23/17 1812  . TPN ADULT (ION)     PRN Meds:.0.9 % irrigation (POUR BTL), [MAR Hold] acetaminophen **OR** [MAR Hold] acetaminophen, [MAR  Hold] hydroxypropyl methylcellulose / hypromellose, [MAR Hold] iopamidol, [MAR Hold]  morphine injection, [MAR Hold] ondansetron **OR** [MAR Hold] ondansetron (ZOFRAN) IV, [MAR Hold] sodium chloride flush   Antibiotics   Anti-infectives (From admission, onward)   Start     Dose/Rate Route Frequency Ordered Stop   09/24/17 1030  cefoTEtan in Dextrose 5% (CEFOTAN) 2-2.08 GM-% IVPB  Status:  Discontinued    Comments:  Hogue, Samantha   : cabinet override      09/24/17 1030 09/24/17 1036   09/24/17 1023  dextrose 5 % with cefOXitin (MEFOXIN) ADS Med  Status:  Discontinued    Comments:  Hogue, Samantha   : cabinet override      09/24/17 1023 09/24/17 1033   09/24/17 0800  [MAR Hold]  cefoTEtan (CEFOTAN) 1 g in dextrose 5 % 50 mL IVPB     (MAR Hold since 09/24/17 1007)   1 g 100 mL/hr over 30 Minutes Intravenous To Short Stay 09/23/17 2310 09/25/17 0800        Subjective:   Terri [name] was seen and examined today am. States small BM yesterday. No abd pain, fever or chills.   Patient denies dizziness, chest pain,  new weakness, numbess, tingling. No acute events overnight.  Objective:   Vitals:   09/23/17 1852 09/23/17 2208 09/24/17 0551 09/24/17 0954  BP:  (!) 137/91 137/80 127/74  Pulse:  (!) 102 (!) 119 (!) 118  Resp:  18 20 20   Temp:  97.7 F (36.5 C) 97.7 F (36.5 C) 98.1 F (36.7 C)  TempSrc:  Oral Oral Oral  SpO2:  93% 97% 93%  Weight: 82.7 kg (182 lb 4.8 oz)     Height:        Intake/Output Summary (Last 24 hours) at 09/24/2017 1208 Last data filed at 09/24/2017 0644 Gross per 24 hour  Intake 3140 ml  Output 725 ml  Net 2415 ml     Wt Readings from Last 3 Encounters:  09/23/17 82.7 kg (182 lb 4.8 oz)  09/22/17 82.4 kg (181 lb 11.2 oz)  09/19/17 72.6 kg (160 lb)     Exam   General: Alert and oriented x 3, NAD  Eyes:   HEENT:    Cardiovascular: S1 S2 clear, RRR. No pedal edema b/l  Respiratory: CTAB  Gastrointestinal: Soft, nontender,  +distended, + bowel sounds  Ext: no pedal edema bilaterally  Neuro: no new deficits  Musculoskeletal: No digital cyanosis, clubbing  Skin: No rashes  Psych: Normal affect and demeanor, alert and oriented x3    Data Reviewed:  I have personally reviewed following labs and imaging studies  Micro Results Recent Results (from the past 240 hour(s))  Surgical pcr screen     Status: None   Collection Time: 09/23/17 10:26 PM  Result Value Ref Range Status   MRSA, PCR NEGATIVE NEGATIVE Final   Staphylococcus aureus NEGATIVE NEGATIVE Final    Comment: (NOTE) The Xpert SA Assay (FDA approved for NASAL specimens in patients 2 years of age and older), is one component of a comprehensive surveillance program. It is not intended to diagnose infection nor to guide or monitor treatment.     Radiology Reports Dg Abd 1 View  Result Date: 09/21/2017 CLINICAL DATA:  Small bowel obstruction.  Repositioning of NG tube. EXAM: ABDOMEN - 1 VIEW FLUOROSCOPY TIME:  30 seconds COMPARISON:  Abdominal radiographs dated 09/21/2017 at 7:43 a.m. FINDINGS: NG tube was advanced under direct fluoroscopic visualization. The tip of the tube is now in the distal stomach. The stomach is decompressed. IMPRESSION: NG tube advanced into the distal stomach. The stomach is decompressed. Electronically Signed   By: Lorriane Shire M.D.   On: 09/21/2017 10:22   Ct Abdomen Pelvis W Contrast  Result Date: 09/16/2017 CLINICAL DATA:  Bowel obstruction EXAM: CT ABDOMEN AND PELVIS WITH CONTRAST TECHNIQUE: Multidetector CT imaging of the abdomen and pelvis was performed using the standard protocol following bolus administration of intravenous contrast. CONTRAST:  138mL ISOVUE-300 IOPAMIDOL (ISOVUE-300) INJECTION 61% COMPARISON:  09/16/2017, 09/04/2017, 05/31/2017 FINDINGS: Lower chest: Lung bases demonstrate no acute consolidation or pleural effusion. Heart size upper normal. Atherosclerotic calcifications. Hepatobiliary: Surgical  absence of gallbladder. Mild intra and extrahepatic biliary enlargement similar compared to prior. Pancreas: Atrophic.  No inflammation Spleen: Normal in size without focal abnormality. Adrenals/Urinary Tract: Adrenal glands are within normal limits. Multifocal cortical scarring in the kidneys. No hydronephrosis. The bladder is within normal limits Stomach/Bowel: Esophageal tube is present, the tip terminates along the wall of the proximal stomach. Multiple loops of markedly dilated, fluid-filled small bowel, measuring up to 6 cm in diameter. Transition point visualized in the right pelvis, series 3, image number 69 and is related to a disease segment of terminal ileum which appears very thickened. The colon is collapsed. Re- demonstrated rectangular 17 mm foreign object in the terminal ileum. Vascular/Lymphatic: Aortic atherosclerosis. No enlarged abdominal or pelvic lymph nodes. Reproductive: Uterus and bilateral adnexa are unremarkable. Other: Negative for free air. Tiny amount of free fluid in the pelvis. Re- demonstrated mild nodularity of the omentum most notable in the right upper quadrant. Musculoskeletal: Degenerative changes of the spine. No acute or suspicious bone lesion. IMPRESSION: 1. Findings consistent with high-grade mechanical small bowel obstruction. Transition point visualized in the right lower quadrant/pelvis and is related to a diseased segment of terminal ileum which demonstrates significant wall thickening and possible stricturing. Negative for free air. 2. Re- demonstrated 17 mm rectangular foreign object in the terminal ileum. 3. Mild nodularity of the omentum re- demonstrated, correlation with tumor markers previously recommended. Electronically Signed   By: Donavan Foil M.D.   On: 09/16/2017 22:14   Ct Abd Limited W/o Cm  Result Date: 09/19/2017 CLINICAL DATA:  83 year old female with a history of Crohn's disease, with CT dated 05/31/2017 showing bowel obstruction. Follow-up CT  imaging September 04, 2017 again shows evidence of Crohn disease, with new omental nodularity. Patient was referred as outpatient for CT-guided omental biopsy. In the interval the patient was admitted with recurrent bowel obstruction, 09/16/2017. Patient presents as inpatient for attempted omental biopsy. EXAM: CT ABDOMEN WITHOUT CONTRAST LIMITED TECHNIQUE: Multidetector CT imaging of the abdomen was performed following the standard protocol without  IV contrast. COMPARISON:  Multiple prior, most recent 09/16/2017, 09/04/2017 FINDINGS: Limited CT of the abdomen was performed for planning purposes for a scheduled CT-guided omental biopsy. Initial image demonstrates calcific density in the distal small bowel, unchanged in position, uncertain etiology. Again, this does not appear to represent the site of the obstruction, in this patient with changes of Crohn's disease and likely strictures. The omental nodularity is not as well-defined on the current CT study. The lower abdominal nodularity is mass thigh reactive ascitic fluid, with no safe target identified during the targeting phase of the procedure. There are small nodules in the upper abdomen adjacent to the distal stomach that were are also targeted, without successful identification given respiratory motion and the size of the nodules, which are smaller than the slice thicknesses of the planning CT. Given that there was no safe site for biopsy, biopsy deferred at this time. Body wall and omental anasarca/edema. Reactive ascitic fluid within small bowel loops, particularly at the right lower quadrant at the site of the previously suggested biopsy target. Evidence of continued bowel obstruction. IMPRESSION: Limited noncontrast CT during planning phase for attempted omental nodule CT-guided biopsy demonstrates there is no safe target on today's study, with the most reasonable site for biopsy masked by edema and ascitic fluid. Biopsy was deferred at this time. Images  also demonstrate persistent small bowel obstruction, with unchanged position of the dense object at the ileocecal valve. Signed, Dulcy Fanny. Earleen Newport, DO [facility] Radiology PLAN: Would suggest another attempt at omental guided biopsy once the patient's condition improves, of small bowel obstruction and reactive changes/ascites resolves. Either CT-guided biopsy or repeat diagnostic CT scan may be considered. Electronically Signed   By: Corrie Mckusick D.O.   On: 09/19/2017 10:28   Ct Entero Abd/pelvis W Contast  Result Date: 09/04/2017 CLINICAL DATA:  83 year old female with history of mid abdominal pain for the past 4 months. History of Crohn's disease. EXAM: CT ABDOMEN AND PELVIS WITH CONTRAST (ENTEROGRAPHY) TECHNIQUE: Multidetector CT of the abdomen and pelvis during bolus administration of intravenous contrast. Negative oral contrast was given. CONTRAST:  175mL ISOVUE-300 IOPAMIDOL (ISOVUE-300) INJECTION 61% COMPARISON:  CT the abdomen and pelvis 05/31/2017. FINDINGS: Lower chest: Calcifications of the aortic valve. Mild cardiomegaly. Hepatobiliary: No cystic or solid hepatic lesions. No intra or extrahepatic biliary ductal dilatation. Gallbladder is not visualized and presumably surgically absent (no surgical clips are noted in the gallbladder fossa). Pancreas: No pancreatic mass. No pancreatic ductal dilatation. No pancreatic or peripancreatic fluid or inflammatory changes. Spleen: Unremarkable. Adrenals/Urinary Tract: Multifocal cortical thinning in the kidneys bilaterally. No suspicious renal lesions. No hydroureteronephrosis. Urinary bladder is normal in appearance. Bilateral adrenal glands nor appearance. Stomach/Bowel: The appearance of the stomach is normal. There is no pathologic dilatation of small bowel or colon. Enterography images demonstrate profound mural thickening of the distal and terminal ileum, with hyperenhancement of the associated mucosa. No  overt surrounding inflammatory changes are noted at this time. No other focal areas of mural thickening are noted in the small bowel or colon. In the lumen of the terminal ileum shortly before the ileocecal valve there is again a dense cylindrical shaped foreign body measuring 16 x 13 x 13 mm. Normal appendix. Vascular/Lymphatic: Aortic atherosclerosis, without evidence of aneurysm or dissection in the abdominal or pelvic vasculature. No lymphadenopathy noted in the abdomen or pelvis. Reproductive: Uterus and ovaries are unremarkable in appearance. Other: Multiple areas of apparent soft tissue nodularity throughout the omentum, measuring up  to 9 mm in diameter (axial image 77 of series 3). No significant volume of ascites. No pneumoperitoneum. Mild diffuse body wall edema. Musculoskeletal: There are no aggressive appearing lytic or blastic lesions noted in the visualized portions of the skeleton. IMPRESSION: 1. Extensive mural thickening and mucosal hyperenhancement throughout the distal and terminal ileum, compatible with the reported clinical history of Crohn's disease. At this time, there are no overt surrounding inflammatory changes to suggest Crohn's exacerbation. 2. Unusual soft tissue nodularity noted throughout the omentum. This is of uncertain etiology and significance, however, this is most commonly seen in the setting of intraperitoneal malignancy. Further clinical evaluation and correlation with tumor markers is suggested. 3. Previously noted intraluminal form body remains in position in the terminal ileum. This is 8 dense cylindrical object measuring 16 x 13 x 13 mm. 4. Aortic atherosclerosis. 5. There are calcifications of the aortic valve. Echocardiographic correlation for evaluation of potential valvular dysfunction may be warranted if clinically indicated. 6. Additional incidental findings, as above. Aortic Atherosclerosis (ICD10-I70.0). Electronically Signed   By: Vinnie Langton M.D.   On:  09/04/2017 10:43   Dg Abdomen Acute W/chest  Result Date: 09/16/2017 CLINICAL DATA:  Initial evaluation for acute vomiting, abdominal pain. EXAM: DG ABDOMEN ACUTE W/ 1V CHEST COMPARISON:  Prior CT from 09/04/2017. FINDINGS: Cardiac and mediastinal silhouettes within normal limits. Lungs hypoinflated. Mild bibasilar atelectasis. No focal infiltrates. No pulmonary edema or pleural effusion. No pneumothorax. Multiple dilated gas-filled loops of small bowel seen within the upper mid and left abdomen. These measure up to 5.2 cm. Associated air-fluid levels. Findings consistent with small bowel obstruction. No soft tissue mass or abnormal calcification. No free air. IMPRESSION: 1. Multiple dilated gas-filled loops of small bowel with associated air-fluid levels, consistent with small bowel obstruction. 2. Shallow lung inflation with mild bibasilar atelectasis. Electronically Signed   By: Jeannine Boga M.D.   On: 09/16/2017 19:16   Dg Abd Portable 1v  Result Date: 09/23/2017 CLINICAL DATA:  Small bowel obstruction EXAM: PORTABLE ABDOMEN - 1 VIEW COMPARISON:  09/21/2017; 09/20/2017 FINDINGS: Re- demonstrated marked gas distention of several centralized loops of rather patulous appearing small bowel with index loop within the left mid hemiabdomen measuring approximately 5.5 cm in diameter, unchanged. These findings are again associated with a conspicuous paucity of distal colonic gas. Nondiagnostic evaluation for pneumoperitoneum secondary supine positioning and exclusion of the lower thorax. No pneumatosis or portal venous gas. Enteric tube tip and side port projected the expected location of the gastric fundus. Several phleboliths overlie the left hemipelvis. Otherwise, no definitive abnormal intra-abdominal calcifications. No definite acute osseus abnormalities. IMPRESSION: Similar findings worrisome for high a grade small-bowel obstruction. Electronically Signed   By: Sandi Mariscal M.D.   On: 09/23/2017  09:02   Dg Abd Portable 1v  Result Date: 09/21/2017 CLINICAL DATA:  Follow-up ileus. EXAM: PORTABLE ABDOMEN - 1 VIEW COMPARISON:  09/20/2017 FINDINGS: Nasogastric tube is in place, tip overlying the very proximal aspect of the stomach. There are significantly dilated central small bowel loops, measuring up to 5.6 cm. Gas is identified within nondilated loops of large bowel. No evidence for free intraperitoneal air. IMPRESSION: Findings consistent with persistent high-grade small bowel obstruction. Electronically Signed   By: Nolon Nations M.D.   On: 09/21/2017 08:18   Dg Abd Portable 1v  Result Date: 09/20/2017 CLINICAL DATA:  83 year old female with small bowel obstruction. EXAM: PORTABLE ABDOMEN - 1 VIEW COMPARISON:  09/18/2017 FINDINGS: Moderately distended loops of gas-filled small bowel are  again seen throughout the mid abdomen. Air is identified within the colon and rectum. Please note, evaluation for free intraperitoneal air is limited on single supine radiograph. No significant osseous abnormalities. IMPRESSION: 1. Persistent moderate distention of gas-filled small bowel loops within the mid abdomen. 2. Air is now identified within the colon and rectum. Electronically Signed   By: Kristopher Oppenheim M.D.   On: 09/20/2017 08:12   Dg Abd Portable 1v  Result Date: 09/18/2017 CLINICAL DATA:  Abdominal pain, small bowel obstruction. EXAM: PORTABLE ABDOMEN - 1 VIEW COMPARISON:  Abdominal CT scan of September 16, 2017 and abdominal series of the same day. FINDINGS: There remain loops of moderately distended gas-filled small bowel in the midline. The uric free of gaseous distention of portions of the jejunum reaches 8.5 cm. No significant gas or stool is observed in the colon. The stomach is nondistended. The esophagogastric tube tip in proximal port project in the gastric cardia. There is contrast material within the urinary bladder. IMPRESSION: Persistent mid to distal small bowel obstruction with  significant gaseous distention of small-bowel loops. No evidence of perforation currently. Electronically Signed   By: David  Martinique M.D.   On: 09/18/2017 10:44    Lab Data:  CBC: Recent Labs  Lab 09/19/17 0324 09/21/17 0353 09/22/17 0452 09/23/17 0452 09/24/17 0431  WBC 11.1* 11.8* 10.6* 13.6* 12.2*  NEUTROABS  --   --  7.4  --  8.4*  HGB 10.7* 10.4* 10.1* 10.2* 10.2*  HCT 34.1* 33.0* 31.9* 31.9* 32.4*  MCV 88.6 90.4 88.9 87.9 88.5  PLT 246 216 181 176 619   Basic Metabolic Panel: Recent Labs  Lab 09/19/17 0324 09/21/17 0353 09/22/17 0452 09/23/17 0452 09/24/17 0431  NA 138 140 138 138 137  K 4.5 4.4 3.9 4.1 4.2  CL 108 108 106 105 103  CO2 23 23 26 28 28   GLUCOSE 86 89 139* 133* 118*  BUN 16 16 19  24* 29*  CREATININE 1.14* 1.11* 1.01* 0.90 0.81  CALCIUM 7.6* 7.9* 8.1* 8.0* 8.1*  MG  --   --  1.3* 2.0 1.8  PHOS  --   --  3.2  --  2.7   GFR: Estimated Creatinine Clearance: 51.5 mL/min (by C-G formula based on SCr of 0.81 mg/dL). Liver Function Tests: Recent Labs  Lab 09/21/17 0353 09/22/17 0452 09/24/17 0431  AST 16 15 15   ALT 11* 11* 10*  ALKPHOS 45 45 45  BILITOT 0.5 0.4 0.5  PROT 4.9* 5.0* 5.0*  ALBUMIN 2.5* 2.5* 2.6*   No results for input(s): LIPASE, AMYLASE in the last 168 hours. No results for input(s): AMMONIA in the last 168 hours. Coagulation Profile: Recent Labs  Lab 09/18/17 0558  INR 1.19   Cardiac Enzymes: No results for input(s): CKTOTAL, CKMB, CKMBINDEX, TROPONINI in the last 168 hours. BNP (last 3 results) No results for input(s): PROBNP in the last 8760 hours. HbA1C: No results for input(s): HGBA1C in the last 72 hours. CBG: Recent Labs  Lab 09/23/17 1222 09/23/17 1802 09/24/17 0015 09/24/17 0618 09/24/17 0958  GLUCAP 156* 191* 146* 109* 119*   Lipid Profile: Recent Labs    09/22/17 0452 09/24/17 0431  TRIG 106 88   Thyroid Function Tests: No results for input(s): TSH, T4TOTAL, FREET4, T3FREE, THYROIDAB in the  last 72 hours. Anemia Panel: No results for input(s): VITAMINB12, FOLATE, FERRITIN, TIBC, IRON, RETICCTPCT in the last 72 hours. Urine analysis:    Component Value Date/Time   COLORURINE YELLOW 09/17/2017 0425  APPEARANCEUR CLEAR 09/17/2017 0425   LABSPEC 1.044 (H) 09/17/2017 0425   PHURINE 5.0 09/17/2017 0425   GLUCOSEU NEGATIVE 09/17/2017 0425   HGBUR NEGATIVE 09/17/2017 0425   BILIRUBINUR NEGATIVE 09/17/2017 0425   KETONESUR NEGATIVE 09/17/2017 0425   PROTEINUR NEGATIVE 09/17/2017 0425   NITRITE NEGATIVE 09/17/2017 0425   LEUKOCYTESUR NEGATIVE 09/17/2017 0425     Ripudeep Rai M.D. [facility] 09/24/2017, 12:08 PM  Pager: [phone] Between 7am to 7pm - call Pager - [phone]  After 7pm go to [email or web address] - password [email or web address]  Call night coverage person covering after 7pm

## 2017-09-24 NOTE — Progress Notes (Signed)
[place] NOTE   Pharmacy Consult for TPN Indication: prolonged ileus  Patient Measurements: Height: 5' 2"  (157.5 cm) Weight: 182 lb 4.8 oz (82.7 kg) IBW/kg (Calculated) : 50.1 TPN AdjBW (KG): 55.7 Body mass index is 33.34 kg/m.  Assessment:  71 yof with recent hx of SBO in July secondary to Crohn's disease admitted. SBO resolved / improved after NGT and steroids, though has had some abdominal sxs in the intervening time.Patient admitted with N/V, abd distention, found to have another SBO. CT scan on 10/23 showed findings suggestive of Crohns, omental nodularity worrisome for malignancy (unable to have biopsy at [facility] this week due to inflammation/SBO), and foreign body in terminal ilium, possibly a tablet present since CT scan in July.  GI: Crohn's dz, high-grade SBO, ommental nodularity. Foreign body in small intestine. Possible surgery today. Pre-albumin 11.7>12.4. Abdomen distension ongoing. +flatus, last BM 11/11. NGT in place, 125 ml output. Folic acid, PPI Endo: No DM hx - CBGs remain controlled but trending up since TPN started (109-191) Noted on Solu-medrol reduced to 19m daily. Synthroid Insulin requirements in the past 24 hours: 8 units of SSI Lytes:  All wnl, Phos and Mg wnl. CoCa 9. Renal: SCr stable, CrCl~51. BUN up to 29. I/Os not charted correctly. NS at 10 ml/hr Pulm: RA > Clarence Cards: BP ok, HR tachy - lopressor IV Hepatobil: LFTs/Tbili/Alk phos wnl. Trig ok at 88. Neuro: morph prn ID: Previously completed 630mof INH for prior +TB test. ID felt no further tx necessary. WBC 12.2 stable, Afebrile - no abx  Best Practices: enox TPN Access: PICC Double lumen TPN start date: 09/21/17 >>  Nutritional Goals (per RD recommendation on 11/9): KCal:  1600-1800 kcal Protein:  80-95 g Fluid: 1.6-1.8 L  Current Nutrition:  NPO TPN  Plan:  Continue TPN at 65 ml/hr TPN provides 90 g of protein, 250g of dextrose, and 47g lipids providing  1679 total kcal, which meets ~100% of patient needs Continue NS at 10 ml/hr per MD Electrolytes in TPN: Cl:Ac ratio 1:1 Add MVI, trace elements, folic acid 74m47mand increase to 14 units of regular insulin in TPN Continue moderate SSI q6h and adjust as needed Monitor TPN labs, clinical progress   JenRenold GentaharmD, BCPS Clinical Pharmacist Phone for today - [phone]/10/2017 7:39 AM

## 2017-09-24 NOTE — Progress Notes (Signed)
Patient ID: Terri [name], female   DOB: 28-Jul-1931, 83 y.o.   MRN: [id number]  Have reviewed chart, scans, and discussed with patient.  Mechanical obstruction with SB inflammation in RLQ.  Will plan exploratory laparotomy with probable small bowel resection later today.  She has a previous right subcostal incision from open cholecystectomy.  The surgical procedure has been discussed with the patient.  Potential risks, benefits, alternative treatments, and expected outcomes have been explained.  All of the patient's questions at this time have been answered.  The likelihood of reaching the patient's treatment goal is good.  The patient understand the proposed surgical procedure and wishes to proceed.  [name]. Georgette Dover, MD, [facility]  General/ Trauma Surgery  09/24/2017 8:20 AM

## 2017-09-24 NOTE — Op Note (Signed)
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 Asst.: Dr. Romana Juniper Anesthesia: Gen. Endotracheal ASA 3 Indications: This is an 83 year old female who presented with a recurrent small bowel obstruction. CT scan showed thickening of the distal terminal ileum as well as thickening of the omentum. She also has a foreign body in her distal terminal ileum.  She has had issues with malnutrition as well as fluid overload. She presents now for exploratory laparotomy.  Operative findings: The patient has peritoneal implants over much of her small bowel and colon mesentery as well as around her pelvis. There are implants along the peritoneum on the anterior abdominal wall. The small bowel has a very tight stricture and the terminal ileum is covered with peritoneal implants She has a large foreign body within the terminal ileum. We remove this at the end of the case through a small enterotomy and this represented the desiccants from a pill bottle. The peritoneal implants involved this cecum.  The omentum is thickened and covered and peritoneal implants. Frozen section confirmed that the peritoneal implants were adenocarcinoma.  Description of procedure: The patient is brought to the operating room placed in supine position on the operating room table.  After an adequate level of general anesthesia was obtained, a Foley catheter was placed under sterile technique. Her abdomen was prepped with ChloraPrep and draped sterile fashion. She was noted to have flipped into atrial fibrillation but her rate was controlled with a Cardizem drip by anesthesia. A timeout was taken. We made a midline incision. The patient has significant abdominal wall anasarca. We dissected down fascia. We into the  peritoneal cavity. The patient has a large amount of ascites. Resection out about 800 mL of clear yellow ascites. I inserted my hand and we immediately palpated peritoneal implants along the anterior abdominal wall. We open the fascia widely and placed retractor. The small bowel is fairly dilated. In the mid ileum we began noticing peritoneal implants along the small bowel and along the small bowel mesentery.The terminal ileum is quite thickened and covered with peritoneal implants There is a hard mass in the terminal ileum. Just above this area a firm foreign body is palpated. The implants cover the terminal ileum around to the cecum. The omentum was quite thickened and covered in implants as well. We removed one of these and sent these for frozen section.we then performed omentectomy with the LigaSure device.We mobilized the right colon by taking down the lateral attachments and rotating it medially. We divided the small bowel proximal to the area of thickest number of peritoneal implants. There is gross disease that is left behind. I did not feel that it was in her best interest to do a complete debulking procedure. The mesentery of the ileum was divided using LigaSure. The ileocolic vessel was ligated with 20 silks and divided. We mobilized the right colon and dividedthe descending colon above the cecum. We used a GIA-75 stapler. I considered an anastomosis but as we were examining the terminal ileum it was obvious that staple line became ischemic. With her malnutrition, ascites, metastatic cancer, and advanced age, as well as her chronic steroid use,we decided not to perform an anastomosis. We irrigated the abdomen thoroughly and inspected for hemostasis. The nasogastric tube was palpated within the stomach. We brought out end ileostomy in the right upper quadrant through a small circular incision. The  fascia was reapproximated with double-stranded #1 PDS suture. Subcutaneous tissues were irrigated and staples  were used to close skin. A Prevena VAC was placed over the wound and placed to suction. We then matured the ileostomy with 3-0 Vicryl.  An ostomy appliance was applied. The patient was then extubated and brought to the recovery room in stable condition. All sponge, Instrument, and needle counts are correct.  [name]. Georgette Dover, MD, [facility]  General/ Trauma Surgery  09/24/2017 1:57 PM

## 2017-09-24 NOTE — Anesthesia Procedure Notes (Signed)
Procedure Name: Intubation Date/Time: 09/24/2017 11:32 AM Performed by: Orlie Dakin, CRNA Pre-anesthesia Checklist: Patient identified, Emergency Drugs available, Suction available, Patient being monitored and Timeout performed Patient Re-evaluated:Patient Re-evaluated prior to induction Oxygen Delivery Method: Circle system utilized Preoxygenation: Pre-oxygenation with 100% oxygen Induction Type: IV induction, Rapid sequence and Cricoid Pressure applied Laryngoscope Size: Miller and 3 Grade View: Grade I Tube type: Oral Tube size: 7.0 mm Number of attempts: 1 Airway Equipment and Method: Stylet Placement Confirmation: ETT inserted through vocal cords under direct vision,  positive ETCO2 and breath sounds checked- equal and bilateral Secured at: 23 cm Tube secured with: Tape Dental Injury: Teeth and Oropharynx as per pre-operative assessment  Comments: 4x4s bite block used.

## 2017-09-24 NOTE — Anesthesia Postprocedure Evaluation (Signed)
Anesthesia Post Note  Patient: Terri [name]  Procedure(s) Performed: EXPLORATORY LAPAROTOMY (N/A Abdomen) OMENTECTOMY (N/A Abdomen) ILEOCECETOMY (N/A Abdomen) ILEOSTOMY (N/A Abdomen)     Patient location during evaluation: PACU Anesthesia Type: General Level of consciousness: awake and alert and oriented Pain management: pain level controlled Vital Signs Assessment: post-procedure vital signs reviewed and stable Respiratory status: spontaneous breathing, nonlabored ventilation, respiratory function stable and patient connected to nasal cannula oxygen Cardiovascular status: blood pressure returned to baseline and unstable Anesthetic complications: no Comments: HR controlled with Cardizem bolus and drip during procedure. Patient was in atrial fibrillation upon arrival to the OR. 12 lead in PACU confirmed Atrial fibrillation. Stable for transport to ICU for continued post operative care.    Last Vitals:  Vitals:   09/24/17 1445 09/24/17 1453  BP:    Pulse: (!) 113 67  Resp: (!) 29 17  Temp:  (!) 36.2 C  SpO2: 92% 94%    Last Pain:  Vitals:   09/24/17 0954  TempSrc: Oral  PainSc:                  [name] A.

## 2017-09-24 NOTE — Consult Note (Signed)
Cardiology Consult    Patient ID: Terri [name] MRN: [id number], DOB/AGE: 1930/12/18   Admit date: 09/16/2017 Date of Consult: 09/24/2017  Primary Physician: Prince Solian, MD Primary Cardiologist: New Requesting Provider: Rai Reason for Consultation: Afib  Terri [name] is a 83 y.o. female who is being seen today for the evaluation of Afib at the request of Dr. Tana Coast.   Patient Profile    83 yo female with PMH of HTN, HL, hypothyroidism, Crohn's disease, TIA, AS, AKI who presented with abd pain and found to have a SBO.   Past Medical History   Past Medical History:  Diagnosis Date  . Age-related macular degeneration, dry, both eyes   . Aortic insufficiency   . Aortic stenosis   . Arthritis    "hands" (05/31/2017)  . Asthma   . Basal cell carcinoma of nose   . Crohn's disease ([place])   . GERD (gastroesophageal reflux disease)   . History of duodenal ulcer   . History of stomach ulcers   . HOH (hard of hearing)   . Hx of rheumatic fever   . Hypertension   . Hypothyroidism   . Positive TB test   . Rheumatic heart disease   . Small bowel obstruction ([place]) 05/31/2017  . Squamous cell cancer of skin of nose   . TIA (transient ischemic attack)   . TIA (transient ischemic attack) 1999?    Past Surgical History:  Procedure Laterality Date  . BASAL CELL CARCINOMA EXCISION  X 2   "face"  . CARPAL TUNNEL RELEASE Right   . CATARACT EXTRACTION W/ INTRAOCULAR LENS IMPLANT Bilateral   . CHOLECYSTECTOMY    . SQUAMOUS CELL CARCINOMA EXCISION  X1   "face"  . TONSILLECTOMY       Allergies  Allergies  Allergen Reactions  . Lidocaine     Claims that it made her heart beat go irregular    History of Present Illness    Terri [name] is an 83 year old female with past medical history of hypertension, hypothyroidism, TIA, Crohn's disease and aortic stenosis.  states she was recently sent as an outpatient for an echocardiogram.  This appears to have been ordered by  her primary care doctor.  Echo showed normal EF of 55-60%, with no wall motion abnormality, mild aortic regurg and mildly dilated left atrium with a PA peak pressure of 31 mmHg.   She presented on 09/16/17 with abdominal pain, nausea and abdominal distention with concerns for small bowel obstruction.  She was diagnosed with Crohn's disease 5 years ago, and had a small bowel obstruction in July of this year that was felt to be secondary to this.  Small bowel obstruction resolved and improved after NG tube and steroids, therefore surgery was avoided.  Of note 10/23 CT scan was suggestive of Crohn's and omental nodularity worrisome for malignancy.  Patient had previously had biopsy scheduled at [facility] around the same time of her admission. CT scan this admission showed high-grade mechanical small bowel obstruction.  NG tube was placed, and surgery consulted.  She had a PICC line placed and was started on TPN.  She was evaluated by interventional radiology, he was unable to do omental biopsy due to significant inflammation and small bowel obstruction.  General surgery was consulted, and plan for exploratory laparotomy with probable small bowel resection.  Per Dr. 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  Perioperatively she developed atrial fibrillation, and was given a Cardizem bolus and drip by anesthesia.  Rates improved  and are now stabilized in the 90s.  In talking with patient she denies any history of arrhythmia prior to this admission.  EKG done confirms atrial fibrillation.  Patient denies any palpitations, lightheadedness or dizziness with associated arrhythmia.  Inpatient Medications    . [MAR Hold] enoxaparin (LOVENOX) injection  40 mg Subcutaneous Q24H  . [MAR Hold] insulin aspart  0-15 Units Subcutaneous Q6H  . [MAR Hold] levothyroxine  50 mcg Intravenous Daily  . [MAR Hold] mouth rinse  15 mL Mouth Rinse BID  . [MAR Hold] methylPREDNISolone (SOLU-MEDROL) injection   60 mg Intravenous Daily  . [MAR Hold] metoprolol tartrate  2.5 mg Intravenous Q6H  . [MAR Hold] pantoprazole (PROTONIX) IV  40 mg Intravenous Q24H    Family History    Family History  Problem Relation Age of Onset  . Bladder Cancer Brother   . Breast cancer Daughter   . Crohn's disease Neg Hx     Social History    Social History   Socioeconomic History  . Marital status: Widowed    Spouse name: Not on file  . Number of children: Not on file  . Years of education: Not on file  . Highest education level: Not on file  Social Needs  . Financial resource strain: Not on file  . Food insecurity - worry: Not on file  . Food insecurity - inability: Not on file  . Transportation needs - medical: Not on file  . Transportation needs - non-medical: Not on file  Occupational History  . Not on file  Tobacco Use  . Smoking status: Former Smoker    Packs/day: 0.10    Years: 15.00    Pack years: 1.50    Types: Cigarettes    Last attempt to quit: 1969    Years since quitting: 49.8  . Smokeless tobacco: Never Used  Substance and Sexual Activity  . Alcohol use: No  . Drug use: No  . Sexual activity: Not Currently  Other Topics Concern  . Not on file  Social History Narrative   Widowed 12/2016. Living with son. Retired Therapist, sports      Review of Systems    See HPI  All other systems reviewed and are otherwise negative except as noted above.  Physical Exam    Blood pressure 122/74, pulse 80, temperature 97.7 F (36.5 C), resp. rate (!) 23, height 5\' 2"  (1.575 m), weight 182 lb 4.8 oz (82.7 kg), SpO2 93 %.  General: Older WF, NAD Psych: Normal affect. Neuro: Alert and oriented X 3. Moves all extremities spontaneously. HEENT: Normal, NGT in place  Neck: Supple without bruits or JVD. Lungs:  Resp regular and unlabored, CTA. Heart: Irreg Irreg no s3, s4, soft systolic murmur. Abdomen: Dressing to abd, distended  Extremities: No clubbing, cyanosis or edema. DP/PT/Radials 2+ and equal  bilaterally.  Labs    Troponin (Point of Care Test) No results for input(s): TROPIPOC in the last 72 hours. No results for input(s): CKTOTAL, CKMB, TROPONINI in the last 72 hours. Lab Results  Component Value Date   WBC 12.2 (H) 09/24/2017   HGB 10.2 (L) 09/24/2017   HCT 32.4 (L) 09/24/2017   MCV 88.5 09/24/2017   PLT 152 09/24/2017    Recent Labs  Lab 09/24/17 0431  NA 137  K 4.2  CL 103  CO2 28  BUN 29*  CREATININE 0.81  CALCIUM 8.1*  PROT 5.0*  BILITOT 0.5  ALKPHOS 45  ALT 10*  AST 15  GLUCOSE 118*  Lab Results  Component Value Date   TRIG 88 09/24/2017   No results found for: [facility]   Radiology Studies    Dg Abd 1 View  Result Date: 09/21/2017 CLINICAL DATA:  Small bowel obstruction.  Repositioning of NG tube. EXAM: ABDOMEN - 1 VIEW FLUOROSCOPY TIME:  30 seconds COMPARISON:  Abdominal radiographs dated 09/21/2017 at 7:43 a.m. FINDINGS: NG tube was advanced under direct fluoroscopic visualization. The tip of the tube is now in the distal stomach. The stomach is decompressed. IMPRESSION: NG tube advanced into the distal stomach. The stomach is decompressed. Electronically Signed   By: Lorriane Shire M.D.   On: 09/21/2017 10:22   Ct Abdomen Pelvis W Contrast  Result Date: 09/16/2017 CLINICAL DATA:  Bowel obstruction EXAM: CT ABDOMEN AND PELVIS WITH CONTRAST TECHNIQUE: Multidetector CT imaging of the abdomen and pelvis was performed using the standard protocol following bolus administration of intravenous contrast. CONTRAST:  164mL ISOVUE-300 IOPAMIDOL (ISOVUE-300) INJECTION 61% COMPARISON:  09/16/2017, 09/04/2017, 05/31/2017 FINDINGS: Lower chest: Lung bases demonstrate no acute consolidation or pleural effusion. Heart size upper normal. Atherosclerotic calcifications. Hepatobiliary: Surgical absence of gallbladder. Mild intra and extrahepatic biliary enlargement similar compared to prior. Pancreas: Atrophic.  No inflammation Spleen: Normal in size without focal  abnormality. Adrenals/Urinary Tract: Adrenal glands are within normal limits. Multifocal cortical scarring in the kidneys. No hydronephrosis. The bladder is within normal limits Stomach/Bowel: Esophageal tube is present, the tip terminates along the wall of the proximal stomach. Multiple loops of markedly dilated, fluid-filled small bowel, measuring up to 6 cm in diameter. Transition point visualized in the right pelvis, series 3, image number 69 and is related to a disease segment of terminal ileum which appears very thickened. The colon is collapsed. Re- demonstrated rectangular 17 mm foreign object in the terminal ileum. Vascular/Lymphatic: Aortic atherosclerosis. No enlarged abdominal or pelvic lymph nodes. Reproductive: Uterus and bilateral adnexa are unremarkable. Other: Negative for free air. Tiny amount of free fluid in the pelvis. Re- demonstrated mild nodularity of the omentum most notable in the right upper quadrant. Musculoskeletal: Degenerative changes of the spine. No acute or suspicious bone lesion. IMPRESSION: 1. Findings consistent with high-grade mechanical small bowel obstruction. Transition point visualized in the right lower quadrant/pelvis and is related to a diseased segment of terminal ileum which demonstrates significant wall thickening and possible stricturing. Negative for free air. 2. Re- demonstrated 17 mm rectangular foreign object in the terminal ileum. 3. Mild nodularity of the omentum re- demonstrated, correlation with tumor markers previously recommended. Electronically Signed   By: Donavan Foil M.D.   On: 09/16/2017 22:14   Ct Abd Limited W/o Cm  Result Date: 09/19/2017 CLINICAL DATA:  83 year old female with a history of Crohn's disease, with CT dated 05/31/2017 showing bowel obstruction. Follow-up CT imaging September 04, 2017 again shows evidence of Crohn disease, with new omental nodularity. Patient was referred as outpatient for CT-guided omental biopsy. In the interval the  patient was admitted with recurrent bowel obstruction, 09/16/2017. Patient presents as inpatient for attempted omental biopsy. EXAM: CT ABDOMEN WITHOUT CONTRAST LIMITED TECHNIQUE: Multidetector CT imaging of the abdomen was performed following the standard protocol without IV contrast. COMPARISON:  Multiple prior, most recent 09/16/2017, 09/04/2017 FINDINGS: Limited CT of the abdomen was performed for planning purposes for a scheduled CT-guided omental biopsy. Initial image demonstrates calcific density in the distal small bowel, unchanged in position, uncertain etiology. Again, this does not appear to represent the site of the obstruction, in this patient with changes of  Crohn's disease and likely strictures. The omental nodularity is not as well-defined on the current CT study. The lower abdominal nodularity is mass thigh reactive ascitic fluid, with no safe target identified during the targeting phase of the procedure. There are small nodules in the upper abdomen adjacent to the distal stomach that were are also targeted, without successful identification given respiratory motion and the size of the nodules, which are smaller than the slice thicknesses of the planning CT. Given that there was no safe site for biopsy, biopsy deferred at this time. Body wall and omental anasarca/edema. Reactive ascitic fluid within small bowel loops, particularly at the right lower quadrant at the site of the previously suggested biopsy target. Evidence of continued bowel obstruction. IMPRESSION: Limited noncontrast CT during planning phase for attempted omental nodule CT-guided biopsy demonstrates there is no safe target on today's study, with the most reasonable site for biopsy masked by edema and ascitic fluid. Biopsy was deferred at this time. Images also demonstrate persistent small bowel obstruction, with unchanged position of the dense object at the ileocecal valve. Signed, Dulcy Fanny. Earleen Newport, DO [facility] Radiology PLAN: Would suggest another attempt at omental guided biopsy once the patient's condition improves, of small bowel obstruction and reactive changes/ascites resolves. Either CT-guided biopsy or repeat diagnostic CT scan may be considered. Electronically Signed   By: Corrie Mckusick D.O.   On: 09/19/2017 10:28   Ct Entero Abd/pelvis W Contast  Result Date: 09/04/2017 CLINICAL DATA:  83 year old female with history of mid abdominal pain for the past 4 months. History of Crohn's disease. EXAM: CT ABDOMEN AND PELVIS WITH CONTRAST (ENTEROGRAPHY) TECHNIQUE: Multidetector CT of the abdomen and pelvis during bolus administration of intravenous contrast. Negative oral contrast was given. CONTRAST:  129mL ISOVUE-300 IOPAMIDOL (ISOVUE-300) INJECTION 61% COMPARISON:  CT the abdomen and pelvis 05/31/2017. FINDINGS: Lower chest: Calcifications of the aortic valve. Mild cardiomegaly. Hepatobiliary: No cystic or solid hepatic lesions. No intra or extrahepatic biliary ductal dilatation. Gallbladder is not visualized and presumably surgically absent (no surgical clips are noted in the gallbladder fossa). Pancreas: No pancreatic mass. No pancreatic ductal dilatation. No pancreatic or peripancreatic fluid or inflammatory changes. Spleen: Unremarkable. Adrenals/Urinary Tract: Multifocal cortical thinning in the kidneys bilaterally. No suspicious renal lesions. No hydroureteronephrosis. Urinary bladder is normal in appearance. Bilateral adrenal glands nor appearance. Stomach/Bowel: The appearance of the stomach is normal. There is no pathologic dilatation of small bowel or colon. Enterography images demonstrate profound mural thickening of the distal and terminal ileum, with hyperenhancement of the associated mucosa. No overt surrounding inflammatory changes are noted at this time. No other focal areas of mural thickening are noted in the small bowel or colon. In the lumen of the terminal  ileum shortly before the ileocecal valve there is again a dense cylindrical shaped foreign body measuring 16 x 13 x 13 mm. Normal appendix. Vascular/Lymphatic: Aortic atherosclerosis, without evidence of aneurysm or dissection in the abdominal or pelvic vasculature. No lymphadenopathy noted in the abdomen or pelvis. Reproductive: Uterus and ovaries are unremarkable in appearance. Other: Multiple areas of apparent soft tissue nodularity throughout the omentum, measuring up to 9 mm in diameter (axial image 77 of series 3). No significant volume of ascites. No pneumoperitoneum. Mild diffuse body wall edema. Musculoskeletal: There are no aggressive appearing lytic or blastic lesions noted in the visualized portions of the skeleton. IMPRESSION: 1. Extensive mural thickening and mucosal hyperenhancement throughout the distal and terminal ileum, compatible with the reported clinical  history of Crohn's disease. At this time, there are no overt surrounding inflammatory changes to suggest Crohn's exacerbation. 2. Unusual soft tissue nodularity noted throughout the omentum. This is of uncertain etiology and significance, however, this is most commonly seen in the setting of intraperitoneal malignancy. Further clinical evaluation and correlation with tumor markers is suggested. 3. Previously noted intraluminal form body remains in position in the terminal ileum. This is 8 dense cylindrical object measuring 16 x 13 x 13 mm. 4. Aortic atherosclerosis. 5. There are calcifications of the aortic valve. Echocardiographic correlation for evaluation of potential valvular dysfunction may be warranted if clinically indicated. 6. Additional incidental findings, as above. Aortic Atherosclerosis (ICD10-I70.0). Electronically Signed   By: Vinnie Langton M.D.   On: 09/04/2017 10:43   Dg Abdomen Acute W/chest  Result Date: 09/16/2017 CLINICAL DATA:  Initial evaluation for acute vomiting, abdominal pain. EXAM: DG ABDOMEN ACUTE W/ 1V CHEST  COMPARISON:  Prior CT from 09/04/2017. FINDINGS: Cardiac and mediastinal silhouettes within normal limits. Lungs hypoinflated. Mild bibasilar atelectasis. No focal infiltrates. No pulmonary edema or pleural effusion. No pneumothorax. Multiple dilated gas-filled loops of small bowel seen within the upper mid and left abdomen. These measure up to 5.2 cm. Associated air-fluid levels. Findings consistent with small bowel obstruction. No soft tissue mass or abnormal calcification. No free air. IMPRESSION: 1. Multiple dilated gas-filled loops of small bowel with associated air-fluid levels, consistent with small bowel obstruction. 2. Shallow lung inflation with mild bibasilar atelectasis. Electronically Signed   By: Jeannine Boga M.D.   On: 09/16/2017 19:16   Dg Abd Portable 1v  Result Date: 09/23/2017 CLINICAL DATA:  Small bowel obstruction EXAM: PORTABLE ABDOMEN - 1 VIEW COMPARISON:  09/21/2017; 09/20/2017 FINDINGS: Re- demonstrated marked gas distention of several centralized loops of rather patulous appearing small bowel with index loop within the left mid hemiabdomen measuring approximately 5.5 cm in diameter, unchanged. These findings are again associated with a conspicuous paucity of distal colonic gas. Nondiagnostic evaluation for pneumoperitoneum secondary supine positioning and exclusion of the lower thorax. No pneumatosis or portal venous gas. Enteric tube tip and side port projected the expected location of the gastric fundus. Several phleboliths overlie the left hemipelvis. Otherwise, no definitive abnormal intra-abdominal calcifications. No definite acute osseus abnormalities. IMPRESSION: Similar findings worrisome for high a grade small-bowel obstruction. Electronically Signed   By: Sandi Mariscal M.D.   On: 09/23/2017 09:02   Dg Abd Portable 1v  Result Date: 09/21/2017 CLINICAL DATA:  Follow-up ileus. EXAM: PORTABLE ABDOMEN - 1 VIEW COMPARISON:  09/20/2017 FINDINGS: Nasogastric tube is in  place, tip overlying the very proximal aspect of the stomach. There are significantly dilated central small bowel loops, measuring up to 5.6 cm. Gas is identified within nondilated loops of large bowel. No evidence for free intraperitoneal air. IMPRESSION: Findings consistent with persistent high-grade small bowel obstruction. Electronically Signed   By: Nolon Nations M.D.   On: 09/21/2017 08:18   Dg Abd Portable 1v  Result Date: 09/20/2017 CLINICAL DATA:  83 year old female with small bowel obstruction. EXAM: PORTABLE ABDOMEN - 1 VIEW COMPARISON:  09/18/2017 FINDINGS: Moderately distended loops of gas-filled small bowel are again seen throughout the mid abdomen. Air is identified within the colon and rectum. Please note, evaluation for free intraperitoneal air is limited on single supine radiograph. No significant osseous abnormalities. IMPRESSION: 1. Persistent moderate distention of gas-filled small bowel loops within the mid abdomen. 2. Air is now identified within the colon and rectum. Electronically Signed   By:  Kristopher Oppenheim M.D.   On: 09/20/2017 08:12   Dg Abd Portable 1v  Result Date: 09/18/2017 CLINICAL DATA:  Abdominal pain, small bowel obstruction. EXAM: PORTABLE ABDOMEN - 1 VIEW COMPARISON:  Abdominal CT scan of September 16, 2017 and abdominal series of the same day. FINDINGS: There remain loops of moderately distended gas-filled small bowel in the midline. The uric free of gaseous distention of portions of the jejunum reaches 8.5 cm. No significant gas or stool is observed in the colon. The stomach is nondistended. The esophagogastric tube tip in proximal port project in the gastric cardia. There is contrast material within the urinary bladder. IMPRESSION: Persistent mid to distal small bowel obstruction with significant gaseous distention of small-bowel loops. No evidence of perforation currently. Electronically Signed   By: David  Martinique M.D.   On: 09/18/2017 10:44    ECG & Cardiac  Imaging    EKG: Afib  Echo: 9/18  Study Conclusions  - Left ventricle: The cavity size was normal. Systolic function was   normal. The estimated ejection fraction was in the range of 55%   to 60%. Wall motion was normal; there were no regional wall   motion abnormalities. Doppler parameters are consistent with both   elevated ventricular end-diastolic filling pressure and elevated   left atrial filling pressure. - Aortic valve: There was mild regurgitation. - Left atrium: The atrium was mildly dilated. - Atrial septum: No defect or patent foramen ovale was identified. - Pulmonary arteries: PA peak pressure: 31 mm Hg (S).  Assessment & Plan    83 yo female with PMH of HTN, HL, hypothyroidism, Crohn's disease, TIA, AS, AKI who presented with abd pain and found to have a SBO.   1. New Afib RVR: Developed perioperatively while undergoing exp lap with Dr. Georgette Dover. Started on IV dilt with 20mg  bolus. Rate is improved into the 90s. Overall asymptomatic. Denies any hx of arrhythmia prior to this admission.  Has a CHADSVASC score of at least 6, therefore would recommend [place] once stable from a surgery standpoint. Renal function is stable, therefore can consider DOAC.   2. SBO 2/2 Crohn's disease: s/p exp lap with ileostomy. NGT in place. Plans per surgery.   3. Mild A/s: reports a hx of rheumatic fever as a child. Stable on exam  4. HTN: tolerating Dilt drip  5. Hypothyroidism: on synthroid, will update TSH   Signed, Reino Bellis, NP-C Pager [phone] 09/24/2017, 2:28 PM   History and all data above reviewed.  Patient examined.  I agree with the findings as above.  The patient has a past history of RHD.  She had two episodes at age 5 and 85.  She has had a "negative " work up by and we do have an echo reported.  She has had a heart murmur for years.  The patient denies any new symptoms such as chest discomfort, neck or arm discomfort. There has been no new shortness of breath, PND  or orthopnea. There have been no reported palpitations, presyncope or syncope.  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  The patient exam reveals COR:RRR  ,  Lungs: Clear  ,  Abd: tender to palpation without bowel sounds, new surgical scars.   Ext No edema  .  All available labs, radiology testing, previous records reviewed. Agree with documented assessment and plan. Atrial fib:  Rate is OK currently on IV Cardizem.  No evidence of ischemia.  No suggestion of heart failure.  Not an anticoagulation candidate with her surgery today.  I suspect that she will convert back to NSR on her own.  If not we will plan rate control and eventual anticoagulation with or without cardioversion.  No reason to repeat an echo at this time.  We will follow with you.  No change in therapy.   Can reduce IV dilt as HR allows but likely would need to stay on 5 - 10 mg IV for now.    Jeneen Rinks Oluwatosin Bracy  3:53 PM  09/24/2017

## 2017-09-24 NOTE — Care Management Important Message (Signed)
Important Message  Patient Details  Name: Terri [name] MRN: [id number] Date of Birth: 1931/01/01   Medicare Important Message Given:  Yes    Terri [name] 09/24/2017, 3:00 PM

## 2017-09-25 ENCOUNTER — Inpatient Hospital Stay (HOSPITAL_COMMUNITY): Payer: Medicare Other

## 2017-09-25 ENCOUNTER — Encounter (HOSPITAL_COMMUNITY): Payer: Self-pay | Admitting: Surgery

## 2017-09-25 DIAGNOSIS — Z515 Encounter for palliative care: Secondary | ICD-10-CM

## 2017-09-25 DIAGNOSIS — C762 Malignant neoplasm of abdomen: Secondary | ICD-10-CM

## 2017-09-25 DIAGNOSIS — Z7189 Other specified counseling: Secondary | ICD-10-CM

## 2017-09-25 LAB — GLUCOSE, CAPILLARY
GLUCOSE-CAPILLARY: 152 mg/dL — AB (ref 65–99)
GLUCOSE-CAPILLARY: 168 mg/dL — AB (ref 65–99)
Glucose-Capillary: 101 mg/dL — ABNORMAL HIGH (ref 65–99)
Glucose-Capillary: 154 mg/dL — ABNORMAL HIGH (ref 65–99)

## 2017-09-25 LAB — CBC
HEMATOCRIT: 32.8 % — AB (ref 36.0–46.0)
Hemoglobin: 10.5 g/dL — ABNORMAL LOW (ref 12.0–15.0)
MCH: 28.8 pg (ref 26.0–34.0)
MCHC: 32 g/dL (ref 30.0–36.0)
MCV: 89.9 fL (ref 78.0–100.0)
PLATELETS: 133 10*3/uL — AB (ref 150–400)
RBC: 3.65 MIL/uL — ABNORMAL LOW (ref 3.87–5.11)
RDW: 14.7 % (ref 11.5–15.5)
WBC: 21.9 10*3/uL — ABNORMAL HIGH (ref 4.0–10.5)

## 2017-09-25 LAB — BASIC METABOLIC PANEL
Anion gap: 5 (ref 5–15)
BUN: 37 mg/dL — AB (ref 6–20)
CHLORIDE: 102 mmol/L (ref 101–111)
CO2: 27 mmol/L (ref 22–32)
CREATININE: 0.99 mg/dL (ref 0.44–1.00)
Calcium: 7.7 mg/dL — ABNORMAL LOW (ref 8.9–10.3)
GFR calc Af Amer: 59 mL/min — ABNORMAL LOW (ref >60)
GFR calc non Af Amer: 51 mL/min — ABNORMAL LOW (ref >60)
GLUCOSE: 129 mg/dL — AB (ref 65–99)
Potassium: 4.5 mmol/L (ref 3.5–5.1)
SODIUM: 134 mmol/L — AB (ref 135–145)

## 2017-09-25 LAB — TSH: TSH: 1.038 u[IU]/mL (ref 0.350–4.500)

## 2017-09-25 MED ORDER — FUROSEMIDE 10 MG/ML IJ SOLN
20.0000 mg | Freq: Once | INTRAMUSCULAR | Status: AC
  Administered 2017-09-25: 20 mg via INTRAVENOUS

## 2017-09-25 MED ORDER — TRAVASOL 10 % IV SOLN
INTRAVENOUS | Status: AC
  Administered 2017-09-25: 18:00:00 via INTRAVENOUS
  Filled 2017-09-25: qty 904.8

## 2017-09-25 MED ORDER — FUROSEMIDE 10 MG/ML IJ SOLN
INTRAMUSCULAR | Status: AC
  Administered 2017-09-25: 20 mg via INTRAVENOUS
  Filled 2017-09-25: qty 2

## 2017-09-25 MED ORDER — METHYLPREDNISOLONE SODIUM SUCC 40 MG IJ SOLR
40.0000 mg | Freq: Every day | INTRAMUSCULAR | Status: DC
Start: 2017-09-25 — End: 2017-09-27
  Administered 2017-09-25 – 2017-09-27 (×3): 40 mg via INTRAVENOUS
  Filled 2017-09-25 (×3): qty 1

## 2017-09-25 MED ORDER — MORPHINE SULFATE (PF) 4 MG/ML IV SOLN
2.0000 mg | INTRAVENOUS | Status: DC | PRN
  Administered 2017-09-25: 4 mg via INTRAVENOUS
  Administered 2017-09-25 – 2017-09-26 (×2): 2 mg via INTRAVENOUS
  Filled 2017-09-25 (×3): qty 1

## 2017-09-25 NOTE — Progress Notes (Signed)
[facility] Gastroenterology Progress Note  Terri [name] 83 y.o. 02-May-1931  CC:  Abdominal pain/small bowel obstruction   Subjective: Complaining of soreness at incisional site. Abdominal pain is improving.    Objective: Vital signs in last 24 hours: Vitals:   09/25/17 1100 09/25/17 1238  BP:  (!) 124/52  Pulse: 79 82  Resp: 13   Temp:  97.9 F (36.6 C)  SpO2: 97% 96%    Physical Exam: Gen : Alert, cooperative. No acute distress Abd: Soft, ileostomy noted. Bowel sounds present. Midline VAC noted.   Lab Results: Recent Labs    09/24/17 0431 09/24/17 1613 09/25/17 0633  NA 137 134* 134*  K 4.2 3.8 4.5  CL 103 101 102  CO2 28 25 27   GLUCOSE 118* 242* 129*  BUN 29* 28* 37*  CREATININE 0.81 0.86 0.99  CALCIUM 8.1* 7.4* 7.7*  MG 1.8 1.5*  --   PHOS 2.7 2.3*  --    Recent Labs    09/24/17 0431  AST 15  ALT 10*  ALKPHOS 45  BILITOT 0.5  PROT 5.0*  ALBUMIN 2.6*   Recent Labs    09/24/17 0431 09/25/17 0633  WBC 12.2* 21.9*  NEUTROABS 8.4*  --   HGB 10.2* 10.5*  HCT 32.4* 32.8*  MCV 88.5 89.9  PLT 152 133*   No results for input(s): LABPROT, INR in the last 72 hours.    Assessment/Plan: - Small bowel obstruction status post exploratory laparotomy, ileocecectomy with end ileostomy yesterday . findings concerning for metastatic adenocarcinoma with peritoneal carcinomatosis, pathology is pending - History of Crohn's ileitis - Foreign body in the distal ileum  Recommendations ----------------------- - Follow pathology - Further management per surgery and primary team - GI will follow periodically.  Otis Brace MD, [place] 09/25/2017, 1:56 PM  Contact #  [phone]

## 2017-09-25 NOTE — Progress Notes (Signed)
1 Day Post-Op   Subjective/Chief Complaint: Patient awake, alert, uncomfortable Mild nausea - minimal NG output Large ileostomy output Converted back to NSR   Objective: Vital signs in last 24 hours: Temp:  [97.2 F (36.2 C)-98.8 F (37.1 C)] 98.5 F (36.9 C) (11/13 0723) Pulse Rate:  [50-118] 74 (11/13 0800) Resp:  [13-29] 17 (11/13 0800) BP: (90-197)/(44-155) 115/44 (11/13 0800) SpO2:  [89 %-100 %] 98 % (11/13 0800) Weight:  [85 kg (187 lb 6.3 oz)] 85 kg (187 lb 6.3 oz) (11/13 0645) Last BM Date: 09/25/17  Intake/Output from previous day: 11/12 0701 - 11/13 0700 In: 3795.5 [I.V.:3022.1; NG/GT:20; IV Piggyback:753.3] Out: 2185 [Urine:885; Emesis/NG output:100; Blood:200] Intake/Output this shift: Total I/O In: 75 [I.V.:75] Out: 450 [Urine:50; Stool:400]  General appearance: alert, cooperative and no distress Resp: clear to auscultation bilaterally Cardio: regular rate and rhythm, S1, S2 normal, no murmur, click, rub or gallop GI: soft, incisional tenderness; pink ileostomy with large liquid output Midline VAC with good seal  Lab Results:  Recent Labs    09/24/17 0431 09/25/17 0633  WBC 12.2* 21.9*  HGB 10.2* 10.5*  HCT 32.4* 32.8*  PLT 152 133*   BMET Recent Labs    09/24/17 1613 09/25/17 0633  NA 134* 134*  K 3.8 4.5  CL 101 102  CO2 25 27  GLUCOSE 242* 129*  BUN 28* 37*  CREATININE 0.86 0.99  CALCIUM 7.4* 7.7*   PT/INR No results for input(s): LABPROT, INR in the last 72 hours. ABG No results for input(s): PHART, HCO3 in the last 72 hours.  Invalid input(s): PCO2, PO2  Studies/Results: Dg Chest Port 1 View  Result Date: 09/25/2017 CLINICAL DATA:  Atelectasis. EXAM: PORTABLE CHEST 1 VIEW COMPARISON:  09/16/2017. FINDINGS: Interim placement NG tube, its tip is below left hemidiaphragm. Interim placement right PICC line, its tip is in the upper portion the right atrium. Mild cardiomegaly and pulmonary venous congestion. Mild basilar atelectasis.  No prominent pleural effusion or pneumothorax. IMPRESSION: 1. Interim placement NG tube, its tip is below left hemidiaphragm. Interim placement of right PICC line, its tip is in the upper portion of the right atrium. 2. Mild cardiomegaly and pulmonary venous congestion. 3. Mild basilar atelectasis . Electronically Signed   By: Marcello Moores  Register   On: 09/25/2017 06:57   Dg Abd Portable 1v  Result Date: 09/23/2017 CLINICAL DATA:  Small bowel obstruction EXAM: PORTABLE ABDOMEN - 1 VIEW COMPARISON:  09/21/2017; 09/20/2017 FINDINGS: Re- demonstrated marked gas distention of several centralized loops of rather patulous appearing small bowel with index loop within the left mid hemiabdomen measuring approximately 5.5 cm in diameter, unchanged. These findings are again associated with a conspicuous paucity of distal colonic gas. Nondiagnostic evaluation for pneumoperitoneum secondary supine positioning and exclusion of the lower thorax. No pneumatosis or portal venous gas. Enteric tube tip and side port projected the expected location of the gastric fundus. Several phleboliths overlie the left hemipelvis. Otherwise, no definitive abnormal intra-abdominal calcifications. No definite acute osseus abnormalities. IMPRESSION: Similar findings worrisome for high a grade small-bowel obstruction. Electronically Signed   By: Sandi Mariscal M.D.   On: 09/23/2017 09:02    Anti-infectives: Anti-infectives (From admission, onward)   Start     Dose/Rate Route Frequency Ordered Stop   09/24/17 1030  cefoTEtan in Dextrose 5% (CEFOTAN) 2-2.08 GM-% IVPB  Status:  Discontinued    Comments:  Hogue, Samantha   : cabinet override      09/24/17 1030 09/24/17 1036   09/24/17 1023  dextrose 5 % with cefOXitin (MEFOXIN) ADS Med  Status:  Discontinued    Comments:  Hogue, Samantha   : cabinet override      09/24/17 1023 09/24/17 1033   09/24/17 0800  cefoTEtan (CEFOTAN) 1 g in dextrose 5 % 50 mL IVPB     1 g 100 mL/hr over 30 Minutes  Intravenous To Short Stay 09/23/17 2310 09/24/17 1149      Assessment/Plan: Recurrent small bowel obstruction Crohn's disease with inflammation of the distal terminal ileum/foreign object distal terminal ileum. Abnormal omental pattern on CT Metastatic adenocarcinoma with peritoneal carcinomatosis - probable SB primary - path pending S/P exploratory laparotomy/ ileocecectomy with end ileostomy  Malnutrition - Prealbumin 11.3: PICC line/TNA starting 09/21/17  per Dr. Tana Coast => prealbumin up to 12.4 ? Fluid overload - CXR with PICC; Dr. Tana Coast adjusting fluids History of GERD/duodenal ulcers- Protonix Mild Renal insufficiency- improving Reported mild aortic stenosis. Hypertension Hypothyroid-synthroid Remote history of rheumatic heart disease.  History of tobacco use History of TIA.  Fen: TNA/ clamp NG tube, ice chips Palliative Care/ Oncology consults for peritoneal carcinomatosis; pathology pending WOCN consult for ostomy care DVT: SCD/Lovenox       LOS: 9 days    Terri [name] K. 09/25/2017

## 2017-09-25 NOTE — Consult Note (Signed)
Consultation Note Date: 09/25/2017   Patient Name: Terri [name]  DOB: 05-25-1931  MRN: [id number]  Age / Sex: 83 y.o., female  PCP: Prince Solian, MD Referring Physician: Mendel Corning, MD  Reason for Consultation: Establishing goals of care  HPI/Patient Profile: 83 y.o. female  with past medical history of rheumatic heart disease, PUD, TIA, AS and presumed crohns who was admitted on 09/16/2017 with high grade SBO.  She underwent exploratory lap on 11/12.  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  She developed atrial fibrillation perioperatively and has been managed on a diltiazem gtt.  Clinical Assessment and Goals of Care:  I have reviewed medical records including EPIC notes, labs and imaging, received report from the bedside RN, assessed the patient and talked with her at bedside to begin to discuss diagnosis prognosis, GOC, EOL wishes, disposition and options.  I introduced Palliative Medicine as specialized medical care for people living with serious illness. It focuses on providing relief from the symptoms and stress of a serious illness. The goal is to improve quality of life for both the patient and the family.  We discussed a brief life review of the patient. 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  She has 2 sons and a daughter.  One son is a pediatrician, one son is a Music therapist, and her daughter is a Manufacturing engineer in [place].  She has 7 grandchildren that seem to be the light of her life.  She has not designated a HCPOA because "it doesn't matter".  My son (pediatrician) knows exactly what I want and he will do it.  She moved to [place] last 2017-01-19 after her husband died of hodgkin's lymphoma.  He was sick only 8 months before he passed  away.  Prior to this hospitalization she was independent with ADLs and walking.  She lives in a "mother in law suite" in her son's home.  We discussed the cancer in her bowel and carcinomatosis.  I explained that carcinomatosis indicates a shorter prognosis than those without carcinomatosis. She understands that treatment will most likely be palliative rather than curative.  She states "I don't want to extend my life with treatment just to be miserable".  She expresses "I can not stand vomiting".  She mentions that she may be near end of life and states that if she is she wants [employer].  I explained that we need to gather all of the information from the pathology results and Oncology (Dr. Gearldine Shown) recommendation - - and that once we have all of the information perhaps we can sort thru it with her son.    I attempted to elicit values and goals of care important to the patient.  She is an independent strong woman who does not want to be dependent and ill for the rest of her life.  She would rather accept hospice care.  Questions and concerns were addressed.   PMT will continue to follow supportively.   Primary Decision Maker:  PATIENT    SUMMARY OF RECOMMENDATIONS    Will revisit with the patient as her hospitalization progresses and more information comes to light.  Symptom Management:   Per primary team.  Additional Recommendations (Limitations, Scope, Preferences):  Full Scope Treatment  Palliative Prophylaxis:   Frequent Pain Assessment  Psycho-social/Spiritual:   Desire for further Chaplaincy support: She is Catholic and requested that I call her Monsignor (which I did)  Prognosis:  To be determined.    Discharge Planning: To Be Determined      Primary Diagnoses: Present on Admission: . SBO (small bowel obstruction) ([place]) . Crohn's disease ([place]) . Crohn's ileitis, with intestinal obstruction ([place]) . Abnormal CT of the abdomen   I have reviewed the  medical record, interviewed the patient and family, and examined the patient. The following aspects are pertinent.  Past Medical History:  Diagnosis Date  . Age-related macular degeneration, dry, both eyes   . Aortic insufficiency   . Aortic stenosis   . Arthritis    "hands" (05/31/2017)  . Asthma   . Basal cell carcinoma of nose   . Crohn's disease ([place])   . GERD (gastroesophageal reflux disease)   . History of duodenal ulcer   . History of stomach ulcers   . HOH (hard of hearing)   . Hx of rheumatic fever   . Hypertension   . Hypothyroidism   . Positive TB test   . Rheumatic heart disease   . Small bowel obstruction ([place]) 05/31/2017  . Squamous cell cancer of skin of nose   . TIA (transient ischemic attack)   . TIA (transient ischemic attack) 1999?   Social History   Socioeconomic History  . Marital status: Widowed    Spouse name: None  . Number of children: None  . Years of education: None  . Highest education level: None  Social Needs  . Financial resource strain: None  . Food insecurity - worry: None  . Food insecurity - inability: None  . Transportation needs - medical: None  . Transportation needs - non-medical: None  Occupational History  . None  Tobacco Use  . Smoking status: Former Smoker    Packs/day: 0.10    Years: 15.00    Pack years: 1.50    Types: Cigarettes    Last attempt to quit: 1969    Years since quitting: 49.8  . Smokeless tobacco: Never Used  Substance and Sexual Activity  . Alcohol use: No  . Drug use: No  . Sexual activity: Not Currently  Other Topics Concern  . None  Social History Narrative   Widowed 12/2016. Living with son. Retired Therapist, sports    Family History  Problem Relation Age of Onset  . Bladder Cancer Brother   . Breast cancer Daughter   . Crohn's disease Neg Hx    Scheduled Meds: . enoxaparin (LOVENOX) injection  40 mg Subcutaneous Q24H  . insulin aspart  0-15 Units Subcutaneous Q6H  . levothyroxine  50 mcg Intravenous  Daily  . mouth rinse  15 mL Mouth Rinse BID  . methylPREDNISolone (SOLU-MEDROL) injection  40 mg Intravenous Daily  . metoprolol tartrate  2.5 mg Intravenous Q6H  . pantoprazole (PROTONIX) IV  40 mg Intravenous Q24H   Continuous Infusions: . diltiazem (CARDIZEM) infusion Stopped (09/25/17 0330)  . TPN ADULT (ION) 65 mL/hr at 09/25/17 1100  . TPN ADULT (ION)  PRN Meds:.[DISCONTINUED] acetaminophen **OR** acetaminophen, hydroxypropyl methylcellulose / hypromellose, iopamidol, ipratropium, levalbuterol, morphine injection, [DISCONTINUED] ondansetron **OR** ondansetron (ZOFRAN) IV, sodium chloride flush Allergies  Allergen Reactions  . Lidocaine     Claims that it made her heart beat go irregular   Review of Systems endorses pain, fatigue, weakness  Physical Exam Well developed female, awake, alert, coherent, cooperative, with N/G in place.  Vital Signs: BP 124/62   Pulse 79   Temp 98.5 F (36.9 C) (Oral)   Resp 13   Ht 5\' 2"  (1.575 m)   Wt 85 kg (187 lb 6.3 oz)   SpO2 97%   BMI 34.27 kg/m  Pain Assessment: 0-10 POSS *See Group Information*: 1-Acceptable,Awake and alert Pain Score: Asleep   SpO2: SpO2: 97 % O2 Device:SpO2: 97 % O2 Flow Rate: .O2 Flow Rate (L/min): (S) 2 L/min  IO: Intake/output summary:   Intake/Output Summary (Last 24 hours) at 09/25/2017 1219 Last data filed at 09/25/2017 1100 Gross per 24 hour  Intake 4065.45 ml  Output 2605 ml  Net 1460.45 ml    LBM: Last BM Date: 09/25/17 Baseline Weight: Weight: 72.6 kg (160 lb) Most recent weight: Weight: 85 kg (187 lb 6.3 oz)     Palliative Assessment/Data: 30%     Time In: 10:00 Time Out: 11:00 Time Total: 60 min. Greater than 50%  of this time was spent counseling and coordinating care related to the above assessment and plan.  Signed by: Florentina Jenny, PA-C Palliative Medicine Pager: [phone]  Please contact Palliative Medicine Team phone at [phone] for questions and concerns.    For individual provider: See Shea Evans

## 2017-09-25 NOTE — Progress Notes (Signed)
[facility]                                                                              Patient Demographics  Terri [name], is a 83 y.o. female, DOB - 1931-05-02, WSF:[id number]  Admit date - 09/16/2017   Admitting Physician Terri Quill, DO  Outpatient Primary MD for the patient is [name], [name], MD  Outpatient specialists:   LOS - 9  days   Medical records reviewed and are as summarized below:    Chief Complaint  Patient presents with  . Abdominal Pain  . Nausea  . Emesis       Brief summary   HPI on 09/16/2017 by Dr. Jolene [name] Braughtonis a 83 y.o.femalewith medical history significant ofCrohn's disease diagnosed x5 years ago. SBO in July this year felt to be secondary to Crohn's disease. SBO resolved / improved after NGT and steroids though she has been having some abdominal symptoms in the intervening time. Patient presents to the ED with c/o nausea, abd distention, concern for another SBO. Of note patient just had CT scan on 10/23 which showed findings suggestive of Crohns, omental nodularity worrisome for malignancy (patient had biopsy scheduled at [facility] on Wed this week), and foreign body in terminal ilium, possibly a pill or tablet that has been present since CT scan in July of this year     Assessment & Plan    Principal Problem:   SBO (small bowel obstruction) ([place]) status post exploratory laparotomy/ileocecectomy and end ileostomy -CT abdomen showed high-grade mechanical SBO with transition point in the right lower quadrant/pelvis and related to disease segment of terminal ileum demonstrating wall thickening and possible stricturing -Patient was placed on conservative management with NPO, NGT.  Serial abdominal x-rays continue to show persistent high-grade SBO.  PICC line was placed, TNA started -Patient underwent exploratory laparotomy and was found to have metastatic adenocarcinoma with peritoneal carcinomatosis  likely causing the SBO, pathology pending. -Status post ileocecectomy and ileostomy, postop day #1 -General surgery following and primarily managing   Active Problems: Intraoperative atrial fibrillation with RVR -Currently back to normal sinus rhythm, was placed on Cardizem drip during OR and postoperatively -Seen by cardiology, recommending no further interventions or echo at this time. -Chest x-ray shows pulmonary venous congestion, I/O's with 8.7L positive, will give 1 dose of Lasix 20 mg IV x1  Omental nodularity, newly diagnosed metastatic adenocarcinoma with peritoneal carcinomatosis - IR was consulted, per Dr. Tresea [name], unable to do-the omental biopsy due to significant reactive inflammation and SBO.  Recommended outpatient biopsy after the acute hospitalization is resolved.  -Patient underwent ex lap on 11/12, biopsy results pending  -Oncology, palliative care consulted, will follow recommendations    Crohn's disease ([place]) -GI was consulted, ID was consulted by GI for possible Remicade use.  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  Patient had completed 6 months of INH -  ID felt no other therapy indicated prior to considering of biological agents. -Patient was started on steroids however will taper now given no Crohn's causing SBO, GI following    Foreign body of small intestine, subsequent encounter -Noted on CT abdomen pelvis 17  mm firm object in the terminal ileum, General surgery following  Hypothyroidism Continue Synthroid IV  Dyspnea/wheezing Improving, continue TNA, Lasix 20 mg IV x1 dose  Code Status: Full CODE STATUS DVT Prophylaxis:  Lovenox  Family Communication: Discussed in detail with the patient, all imaging results, lab results explained to the patient    Disposition Plan: Transfer to telemetry floor  Time Spent in minutes  35 minutes  Procedures:    Consultants:   General surgery GI IR Oncology Palliative care  Antimicrobials:       Medications  Scheduled Meds: . enoxaparin (LOVENOX) injection  40 mg Subcutaneous Q24H  . insulin aspart  0-15 Units Subcutaneous Q6H  . levothyroxine  50 mcg Intravenous Daily  . mouth rinse  15 mL Mouth Rinse BID  . methylPREDNISolone (SOLU-MEDROL) injection  40 mg Intravenous Daily  . metoprolol tartrate  2.5 mg Intravenous Q6H  . pantoprazole (PROTONIX) IV  40 mg Intravenous Q24H   Continuous Infusions: . diltiazem (CARDIZEM) infusion Stopped (09/25/17 0330)  . TPN ADULT (ION) 65 mL/hr at 09/25/17 1100  . TPN ADULT (ION)     PRN Meds:.[DISCONTINUED] acetaminophen **OR** acetaminophen, hydroxypropyl methylcellulose / hypromellose, iopamidol, ipratropium, levalbuterol, morphine injection, [DISCONTINUED] ondansetron **OR** ondansetron (ZOFRAN) IV, sodium chloride flush   Antibiotics   Anti-infectives (From admission, onward)   Start     Dose/Rate Route Frequency Ordered Stop   09/24/17 1030  cefoTEtan in Dextrose 5% (CEFOTAN) 2-2.08 GM-% IVPB  Status:  Discontinued    Comments:  [name], Terri   : cabinet override      09/24/17 1030 09/24/17 1036   09/24/17 1023  dextrose 5 % with cefOXitin (MEFOXIN) ADS Med  Status:  Discontinued    Comments:  [name], Terri   : cabinet override      09/24/17 1023 09/24/17 1033   09/24/17 0800  cefoTEtan (CEFOTAN) 1 g in dextrose 5 % 50 mL IVPB     1 g 100 mL/hr over 30 Minutes Intravenous To Short Stay 09/23/17 2310 09/24/17 1149        Subjective:   Terri [name] was seen and examined today.  Feeling somewhat depressed given the new diagnosis of adenocarcinoma.  Abdominal pain 7/10, no fevers or chills, no vomiting.  Patient denies dizziness, chest pain,  new weakness, numbess, tingling. No acute events overnight.  Objective:   Vitals:   09/25/17 0800 09/25/17 0900 09/25/17 1000 09/25/17 1100  BP: (!) 115/44 (!) 115/52 124/62   Pulse: 74 78 83 79  Resp: 17 16 10 13   Temp:      TempSrc:      SpO2: 98% 97% 96% 97%   Weight:      Height:        Intake/Output Summary (Last 24 hours) at 09/25/2017 1234 Last data filed at 09/25/2017 1100 Gross per 24 hour  Intake 3815.45 ml  Output 2605 ml  Net 1210.45 ml     Wt Readings from Last 3 Encounters:  09/25/17 85 kg (187 lb 6.3 oz)  09/22/17 82.4 kg (181 lb 11.2 oz)  09/19/17 72.6 kg (160 lb)     Exam   General: Alert and oriented x 3, NAD  Eyes:   HEENT:    Cardiovascular: S1 S2 auscultated, Regular rate and rhythm. Trace pedal edema  Respiratory: Decreased breath sound at the bases  Gastrointestinal: Soft, ileostomy +  Ext: no pedal edema bilaterally  Neuro: no new deficits   musculoskeletal: No digital cyanosis, clubbing  Skin: No rashes  Psych: sad, alert and oriented x3    Data Reviewed:  I have personally reviewed following labs and imaging studies  Micro Results Recent Results (from the past 240 hour(s))  Surgical pcr screen     Status: None   Collection Time: 09/23/17 10:26 PM  Result Value Ref Range Status   MRSA, PCR NEGATIVE NEGATIVE Final   Staphylococcus aureus NEGATIVE NEGATIVE Final    Comment: (NOTE) The Xpert SA Assay (FDA approved for NASAL specimens in patients 53 years of age and older), is one component of a comprehensive surveillance program. It is not intended to diagnose infection nor to guide or monitor treatment.     Radiology Reports Dg Abd 1 View  Result Date: 09/21/2017 CLINICAL DATA:  Small bowel obstruction.  Repositioning of NG tube. EXAM: ABDOMEN - 1 VIEW FLUOROSCOPY TIME:  30 seconds COMPARISON:  Abdominal radiographs dated 09/21/2017 at 7:43 a.m. FINDINGS: NG tube was advanced under direct fluoroscopic visualization. The tip of the tube is now in the distal stomach. The stomach is decompressed. IMPRESSION: NG tube advanced into the distal stomach. The stomach is decompressed. Electronically Signed   By: Lorriane Shire M.D.   On: 09/21/2017 10:22   Ct Abdomen Pelvis W  Contrast  Result Date: 09/16/2017 CLINICAL DATA:  Bowel obstruction EXAM: CT ABDOMEN AND PELVIS WITH CONTRAST TECHNIQUE: Multidetector CT imaging of the abdomen and pelvis was performed using the standard protocol following bolus administration of intravenous contrast. CONTRAST:  134mL ISOVUE-300 IOPAMIDOL (ISOVUE-300) INJECTION 61% COMPARISON:  09/16/2017, 09/04/2017, 05/31/2017 FINDINGS: Lower chest: Lung bases demonstrate no acute consolidation or pleural effusion. Heart size upper normal. Atherosclerotic calcifications. Hepatobiliary: Surgical absence of gallbladder. Mild intra and extrahepatic biliary enlargement similar compared to prior. Pancreas: Atrophic.  No inflammation Spleen: Normal in size without focal abnormality. Adrenals/Urinary Tract: Adrenal glands are within normal limits. Multifocal cortical scarring in the kidneys. No hydronephrosis. The bladder is within normal limits Stomach/Bowel: Esophageal tube is present, the tip terminates along the wall of the proximal stomach. Multiple loops of markedly dilated, fluid-filled small bowel, measuring up to 6 cm in diameter. Transition point visualized in the right pelvis, series 3, image number 69 and is related to a disease segment of terminal ileum which appears very thickened. The colon is collapsed. Re- demonstrated rectangular 17 mm foreign object in the terminal ileum. Vascular/Lymphatic: Aortic atherosclerosis. No enlarged abdominal or pelvic lymph nodes. Reproductive: Uterus and bilateral adnexa are unremarkable. Other: Negative for free air. Tiny amount of free fluid in the pelvis. Re- demonstrated mild nodularity of the omentum most notable in the right upper quadrant. Musculoskeletal: Degenerative changes of the spine. No acute or suspicious bone lesion. IMPRESSION: 1. Findings consistent with high-grade mechanical small bowel obstruction. Transition point visualized in the right lower quadrant/pelvis and is related to a diseased segment of  terminal ileum which demonstrates significant wall thickening and possible stricturing. Negative for free air. 2. Re- demonstrated 17 mm rectangular foreign object in the terminal ileum. 3. Mild nodularity of the omentum re- demonstrated, correlation with tumor markers previously recommended. Electronically Signed   By: Donavan Foil M.D.   On: 09/16/2017 22:14   Ct Abd Limited W/o Cm  Result Date: 09/19/2017 CLINICAL DATA:  83 year old female with a history of Crohn's disease, with CT dated 05/31/2017 showing bowel obstruction. Follow-up CT imaging September 04, 2017 again shows evidence of Crohn disease, with new omental nodularity. Patient was referred as outpatient for CT-guided omental biopsy. In the interval the patient was admitted with  recurrent bowel obstruction, 09/16/2017. Patient presents as inpatient for attempted omental biopsy. EXAM: CT ABDOMEN WITHOUT CONTRAST LIMITED TECHNIQUE: Multidetector CT imaging of the abdomen was performed following the standard protocol without IV contrast. COMPARISON:  Multiple prior, most recent 09/16/2017, 09/04/2017 FINDINGS: Limited CT of the abdomen was performed for planning purposes for a scheduled CT-guided omental biopsy. Initial image demonstrates calcific density in the distal small bowel, unchanged in position, uncertain etiology. Again, this does not appear to represent the site of the obstruction, in this patient with changes of Crohn's disease and likely strictures. The omental nodularity is not as well-defined on the current CT study. The lower abdominal nodularity is mass thigh reactive ascitic fluid, with no safe target identified during the targeting phase of the procedure. There are small nodules in the upper abdomen adjacent to the distal stomach that were are also targeted, without successful identification given respiratory motion and the size of the nodules, which are smaller than the slice thicknesses of the planning CT. Given that there was no  safe site for biopsy, biopsy deferred at this time. Body wall and omental anasarca/edema. Reactive ascitic fluid within small bowel loops, particularly at the right lower quadrant at the site of the previously suggested biopsy target. Evidence of continued bowel obstruction. IMPRESSION: Limited noncontrast CT during planning phase for attempted omental nodule CT-guided biopsy demonstrates there is no safe target on today's study, with the most reasonable site for biopsy masked by edema and ascitic fluid. Biopsy was deferred at this time. Images also demonstrate persistent small bowel obstruction, with unchanged position of the dense object at the ileocecal valve. Signed, Dulcy Fanny. Earleen Newport, DO [facility] Radiology PLAN: Would suggest another attempt at omental guided biopsy once the patient's condition improves, of small bowel obstruction and reactive changes/ascites resolves. Either CT-guided biopsy or repeat diagnostic CT scan may be considered. Electronically Signed   By: Corrie Mckusick D.O.   On: 09/19/2017 10:28   Ct Entero Abd/pelvis W Contast  Result Date: 09/04/2017 CLINICAL DATA:  83 year old female with history of mid abdominal pain for the past 4 months. History of Crohn's disease. EXAM: CT ABDOMEN AND PELVIS WITH CONTRAST (ENTEROGRAPHY) TECHNIQUE: Multidetector CT of the abdomen and pelvis during bolus administration of intravenous contrast. Negative oral contrast was given. CONTRAST:  138mL ISOVUE-300 IOPAMIDOL (ISOVUE-300) INJECTION 61% COMPARISON:  CT the abdomen and pelvis 05/31/2017. FINDINGS: Lower chest: Calcifications of the aortic valve. Mild cardiomegaly. Hepatobiliary: No cystic or solid hepatic lesions. No intra or extrahepatic biliary ductal dilatation. Gallbladder is not visualized and presumably surgically absent (no surgical clips are noted in the gallbladder fossa). Pancreas: No pancreatic mass. No pancreatic ductal dilatation. No  pancreatic or peripancreatic fluid or inflammatory changes. Spleen: Unremarkable. Adrenals/Urinary Tract: Multifocal cortical thinning in the kidneys bilaterally. No suspicious renal lesions. No hydroureteronephrosis. Urinary bladder is normal in appearance. Bilateral adrenal glands nor appearance. Stomach/Bowel: The appearance of the stomach is normal. There is no pathologic dilatation of small bowel or colon. Enterography images demonstrate profound mural thickening of the distal and terminal ileum, with hyperenhancement of the associated mucosa. No overt surrounding inflammatory changes are noted at this time. No other focal areas of mural thickening are noted in the small bowel or colon. In the lumen of the terminal ileum shortly before the ileocecal valve there is again a dense cylindrical shaped foreign body measuring 16 x 13 x 13 mm. Normal appendix. Vascular/Lymphatic: Aortic atherosclerosis, without evidence of aneurysm or dissection in the abdominal  or pelvic vasculature. No lymphadenopathy noted in the abdomen or pelvis. Reproductive: Uterus and ovaries are unremarkable in appearance. Other: Multiple areas of apparent soft tissue nodularity throughout the omentum, measuring up to 9 mm in diameter (axial image 77 of series 3). No significant volume of ascites. No pneumoperitoneum. Mild diffuse body wall edema. Musculoskeletal: There are no aggressive appearing lytic or blastic lesions noted in the visualized portions of the skeleton. IMPRESSION: 1. Extensive mural thickening and mucosal hyperenhancement throughout the distal and terminal ileum, compatible with the reported clinical history of Crohn's disease. At this time, there are no overt surrounding inflammatory changes to suggest Crohn's exacerbation. 2. Unusual soft tissue nodularity noted throughout the omentum. This is of uncertain etiology and significance, however, this is most commonly seen in the setting of intraperitoneal malignancy. Further  clinical evaluation and correlation with tumor markers is suggested. 3. Previously noted intraluminal form body remains in position in the terminal ileum. This is 8 dense cylindrical object measuring 16 x 13 x 13 mm. 4. Aortic atherosclerosis. 5. There are calcifications of the aortic valve. Echocardiographic correlation for evaluation of potential valvular dysfunction may be warranted if clinically indicated. 6. Additional incidental findings, as above. Aortic Atherosclerosis (ICD10-I70.0). Electronically Signed   By: Vinnie Langton M.D.   On: 09/04/2017 10:43   Dg Chest Port 1 View  Result Date: 09/25/2017 CLINICAL DATA:  Atelectasis. EXAM: PORTABLE CHEST 1 VIEW COMPARISON:  09/16/2017. FINDINGS: Interim placement NG tube, its tip is below left hemidiaphragm. Interim placement right PICC line, its tip is in the upper portion the right atrium. Mild cardiomegaly and pulmonary venous congestion. Mild basilar atelectasis. No prominent pleural effusion or pneumothorax. IMPRESSION: 1. Interim placement NG tube, its tip is below left hemidiaphragm. Interim placement of right PICC line, its tip is in the upper portion of the right atrium. 2. Mild cardiomegaly and pulmonary venous congestion. 3. Mild basilar atelectasis . Electronically Signed   By: Marcello Moores  Register   On: 09/25/2017 06:57   Dg Abdomen Acute W/chest  Result Date: 09/16/2017 CLINICAL DATA:  Initial evaluation for acute vomiting, abdominal pain. EXAM: DG ABDOMEN ACUTE W/ 1V CHEST COMPARISON:  Prior CT from 09/04/2017. FINDINGS: Cardiac and mediastinal silhouettes within normal limits. Lungs hypoinflated. Mild bibasilar atelectasis. No focal infiltrates. No pulmonary edema or pleural effusion. No pneumothorax. Multiple dilated gas-filled loops of small bowel seen within the upper mid and left abdomen. These measure up to 5.2 cm. Associated air-fluid levels. Findings consistent with small bowel obstruction. No soft tissue mass or abnormal  calcification. No free air. IMPRESSION: 1. Multiple dilated gas-filled loops of small bowel with associated air-fluid levels, consistent with small bowel obstruction. 2. Shallow lung inflation with mild bibasilar atelectasis. Electronically Signed   By: Jeannine Boga M.D.   On: 09/16/2017 19:16   Dg Abd Portable 1v  Result Date: 09/23/2017 CLINICAL DATA:  Small bowel obstruction EXAM: PORTABLE ABDOMEN - 1 VIEW COMPARISON:  09/21/2017; 09/20/2017 FINDINGS: Re- demonstrated marked gas distention of several centralized loops of rather patulous appearing small bowel with index loop within the left mid hemiabdomen measuring approximately 5.5 cm in diameter, unchanged. These findings are again associated with a conspicuous paucity of distal colonic gas. Nondiagnostic evaluation for pneumoperitoneum secondary supine positioning and exclusion of the lower thorax. No pneumatosis or portal venous gas. Enteric tube tip and side port projected the expected location of the gastric fundus. Several phleboliths overlie the left hemipelvis. Otherwise, no definitive abnormal intra-abdominal calcifications. No definite acute osseus abnormalities. IMPRESSION:  Similar findings worrisome for high a grade small-bowel obstruction. Electronically Signed   By: Sandi Mariscal M.D.   On: 09/23/2017 09:02   Dg Abd Portable 1v  Result Date: 09/21/2017 CLINICAL DATA:  Follow-up ileus. EXAM: PORTABLE ABDOMEN - 1 VIEW COMPARISON:  09/20/2017 FINDINGS: Nasogastric tube is in place, tip overlying the very proximal aspect of the stomach. There are significantly dilated central small bowel loops, measuring up to 5.6 cm. Gas is identified within nondilated loops of large bowel. No evidence for free intraperitoneal air. IMPRESSION: Findings consistent with persistent high-grade small bowel obstruction. Electronically Signed   By: Nolon Nations M.D.   On: 09/21/2017 08:18   Dg Abd Portable 1v  Result Date: 09/20/2017 CLINICAL DATA:   83 year old female with small bowel obstruction. EXAM: PORTABLE ABDOMEN - 1 VIEW COMPARISON:  09/18/2017 FINDINGS: Moderately distended loops of gas-filled small bowel are again seen throughout the mid abdomen. Air is identified within the colon and rectum. Please note, evaluation for free intraperitoneal air is limited on single supine radiograph. No significant osseous abnormalities. IMPRESSION: 1. Persistent moderate distention of gas-filled small bowel loops within the mid abdomen. 2. Air is now identified within the colon and rectum. Electronically Signed   By: Kristopher Oppenheim M.D.   On: 09/20/2017 08:12   Dg Abd Portable 1v  Result Date: 09/18/2017 CLINICAL DATA:  Abdominal pain, small bowel obstruction. EXAM: PORTABLE ABDOMEN - 1 VIEW COMPARISON:  Abdominal CT scan of September 16, 2017 and abdominal series of the same day. FINDINGS: There remain loops of moderately distended gas-filled small bowel in the midline. The uric free of gaseous distention of portions of the jejunum reaches 8.5 cm. No significant gas or stool is observed in the colon. The stomach is nondistended. The esophagogastric tube tip in proximal port project in the gastric cardia. There is contrast material within the urinary bladder. IMPRESSION: Persistent mid to distal small bowel obstruction with significant gaseous distention of small-bowel loops. No evidence of perforation currently. Electronically Signed   By: David  Martinique M.D.   On: 09/18/2017 10:44    Lab Data:  CBC: Recent Labs  Lab 09/21/17 0353 09/22/17 0452 09/23/17 0452 09/24/17 0431 09/25/17 0633  WBC 11.8* 10.6* 13.6* 12.2* 21.9*  NEUTROABS  --  7.4  --  8.4*  --   HGB 10.4* 10.1* 10.2* 10.2* 10.5*  HCT 33.0* 31.9* 31.9* 32.4* 32.8*  MCV 90.4 88.9 87.9 88.5 89.9  PLT 216 181 176 152 096*   Basic Metabolic Panel: Recent Labs  Lab 09/22/17 0452 09/23/17 0452 09/24/17 0431 09/24/17 1613 09/25/17 0633  NA 138 138 137 134* 134*  K 3.9 4.1 4.2 3.8 4.5   CL 106 105 103 101 102  CO2 26 28 28 25 27   GLUCOSE 139* 133* 118* 242* 129*  BUN 19 24* 29* 28* 37*  CREATININE 1.01* 0.90 0.81 0.86 0.99  CALCIUM 8.1* 8.0* 8.1* 7.4* 7.7*  MG 1.3* 2.0 1.8 1.5*  --   PHOS 3.2  --  2.7 2.3*  --    GFR: Estimated Creatinine Clearance: 42.8 mL/min (by C-G formula based on SCr of 0.99 mg/dL). Liver Function Tests: Recent Labs  Lab 09/21/17 0353 09/22/17 0452 09/24/17 0431  AST 16 15 15   ALT 11* 11* 10*  ALKPHOS 45 45 45  BILITOT 0.5 0.4 0.5  PROT 4.9* 5.0* 5.0*  ALBUMIN 2.5* 2.5* 2.6*   No results for input(s): LIPASE, AMYLASE in the last 168 hours. No results for input(s): AMMONIA in the  last 168 hours. Coagulation Profile: No results for input(s): INR, PROTIME in the last 168 hours. Cardiac Enzymes: No results for input(s): CKTOTAL, CKMB, CKMBINDEX, TROPONINI in the last 168 hours. BNP (last 3 results) No results for input(s): PROBNP in the last 8760 hours. HbA1C: No results for input(s): HGBA1C in the last 72 hours. CBG: Recent Labs  Lab 09/24/17 1531 09/24/17 1757 09/24/17 2333 09/25/17 0645 09/25/17 1208  GLUCAP 174* 188* 173* 101* 154*   Lipid Profile: Recent Labs    09/24/17 0431  TRIG 88   Thyroid Function Tests: Recent Labs    09/25/17 0633  TSH 1.038   Anemia Panel: No results for input(s): VITAMINB12, FOLATE, FERRITIN, TIBC, IRON, RETICCTPCT in the last 72 hours. Urine analysis:    Component Value Date/Time   COLORURINE YELLOW 09/17/2017 0425   APPEARANCEUR CLEAR 09/17/2017 0425   LABSPEC 1.044 (H) 09/17/2017 0425   PHURINE 5.0 09/17/2017 0425   GLUCOSEU NEGATIVE 09/17/2017 0425   HGBUR NEGATIVE 09/17/2017 0425   BILIRUBINUR NEGATIVE 09/17/2017 0425   KETONESUR NEGATIVE 09/17/2017 0425   PROTEINUR NEGATIVE 09/17/2017 0425   NITRITE NEGATIVE 09/17/2017 0425   LEUKOCYTESUR NEGATIVE 09/17/2017 0425     Noemi Ishmael M.D. [facility] 09/25/2017, 12:34 PM  Pager: [phone] Between 7am to 7pm -  call Pager - [phone]  After 7pm go to [email or web address] - password [email or web address]  Call night coverage person covering after 7pm

## 2017-09-25 NOTE — Progress Notes (Signed)
Report called to 6N charge RN, tx w/ vss to 6N 15, recvd by [employer] and report confirmed at bs. Both son notified earlier of imminent tx.

## 2017-09-25 NOTE — Progress Notes (Signed)
[place] NOTE   Pharmacy Consult for TPN Indication: prolonged ileus  Patient Measurements: Height: _0  (157.5 cm) Weight: 187 lb 6.3 oz (85 kg) IBW/kg (Calculated) : 50.1 TPN AdjBW (KG): 55.7 Body mass index is 34.27 kg/m.  Assessment:  66 yof with recent hx of SBO in July secondary to Crohn's disease admitted. SBO resolved / improved after NGT and steroids, though has had some abdominal sxs in the intervening time.Patient admitted with N/V, abd distention, found to have another SBO. CT scan on 10/23 showed findings suggestive of Crohns, omental nodularity worrisome for malignancy (unable to have biopsy at [facility] this week due to inflammation/SBO), and foreign body in terminal ilium, possibly a tablet present since CT scan in July.  GI: Crohn's dz, high-grade SBO, ommental nodularity. Foreign body in small intestine. Went to OR on 11/12 for ex lap. Found several peritoneal implants. Foreign body was removed. Albumin low at 2.6. Pre-albumin 12.4. Abdomen distension ongoing. +flatus, last BM 11/11. NGT in place, 100 ml output. May try to clamp NG today. Large ileostomy output. Folic acid, PPI Endo: No DM hx - CBGs remain controlled but trending up since TPN started (100-180s) Noted on Solu-medrol reduced to 69m daily. Synthroid Insulin requirements in the past 24 hours: 6 units of SSI + 14 units in TPN Lytes:  All wnl, Phos low yesterday at 2.3 (replaced), Mg low at 1.5 (replaced) CoCa 8.6. Renal: SCr stable, CrCl~51. BUN up to 29. I/Os not charted correctly. NS at 10 ml/hr Pulm: Unable to be extubated post surgery so moved to the ICU. Cards: BP ok, HR tachy - lopressor IV Hepatobil: LFTs/Tbili/Alk phos wnl. Trig ok at 88. Neuro: morph prn ID: Previously completed 614mof INH for prior +TB test. ID felt no further tx necessary. WBC up to 21.9 today, Afebrile - no abx  Best Practices: enox TPN Access: PICC Double lumen TPN start date: 09/21/17  >>  Nutritional Goals (per RD recommendation on 11/9): KCal:  1600-1800 kcal Protein:  80-95 g Fluid: 1.6-1.8 L  Current Nutrition:  NPO TPN  Plan:  Continue TPN at 65 ml/hr TPN provides 90 g of protein, 250g of dextrose, and 47g lipids providing 1679 total kcal, which meets ~100% of patient needs Continue NS at 10 ml/hr per MD Electrolytes in TPN: Cl:Ac ratio 1:1 Add MVI, trace elements, folic acid [id number] add 15 units of regular insulin in TPN Continue moderate SSI q6h and adjust as needed Monitor TPN labs, recheck labs tomorrow, clinical progress F/U GOC   NatElenor QuinonesharmD, BCPOrthopedic Healthcare Ancillary Services LLC Dba Slocum Ambulatory Surgery Centerinical Pharmacist Pager [phone]/13/2018 8:17 AM

## 2017-09-25 NOTE — Consult Note (Addendum)
[place] Nurse ostomy consult note Stoma type/location: Pt had surgery yesterday and received an ileostomy Stomal assessment/size: Pouch intact with good seal on the first post-op day.  Stoma visualized through pouch; red and viable, appears to be flush with skin level. Output: Mod amt semiformed brown stool in the pouch  Ostomy pouching: 2pc.  Education provided:  Briefly discussed pouching routines.  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  Will perform pouch change and educational session when stable and out of ICU. Supplies left at the bedside for staff nurse use.  No family present in the room. Prevena Vac intact with good seal to midline abd wound at 125 mm cont suction.  This can remian in place for 7 days; please refer to surgical team for plan of care. Julien Girt MSN, RN, [place], [name], [place]

## 2017-09-26 LAB — CBC
HEMATOCRIT: 31.7 % — AB (ref 36.0–46.0)
Hemoglobin: 10 g/dL — ABNORMAL LOW (ref 12.0–15.0)
MCH: 27.9 pg (ref 26.0–34.0)
MCHC: 31.5 g/dL (ref 30.0–36.0)
MCV: 88.5 fL (ref 78.0–100.0)
PLATELETS: 125 10*3/uL — AB (ref 150–400)
RBC: 3.58 MIL/uL — ABNORMAL LOW (ref 3.87–5.11)
RDW: 14.3 % (ref 11.5–15.5)
WBC: 21.2 10*3/uL — AB (ref 4.0–10.5)

## 2017-09-26 LAB — BASIC METABOLIC PANEL
Anion gap: 5 (ref 5–15)
BUN: 33 mg/dL — AB (ref 6–20)
CHLORIDE: 100 mmol/L — AB (ref 101–111)
CO2: 29 mmol/L (ref 22–32)
CREATININE: 1.02 mg/dL — AB (ref 0.44–1.00)
Calcium: 7.9 mg/dL — ABNORMAL LOW (ref 8.9–10.3)
GFR calc Af Amer: 57 mL/min — ABNORMAL LOW (ref >60)
GFR calc non Af Amer: 49 mL/min — ABNORMAL LOW (ref >60)
GLUCOSE: 140 mg/dL — AB (ref 65–99)
POTASSIUM: 5 mmol/L (ref 3.5–5.1)
SODIUM: 134 mmol/L — AB (ref 135–145)

## 2017-09-26 LAB — GLUCOSE, CAPILLARY
GLUCOSE-CAPILLARY: 144 mg/dL — AB (ref 65–99)
GLUCOSE-CAPILLARY: 139 mg/dL — AB (ref 65–99)
GLUCOSE-CAPILLARY: 172 mg/dL — AB (ref 65–99)
Glucose-Capillary: 144 mg/dL — ABNORMAL HIGH (ref 65–99)

## 2017-09-26 LAB — MAGNESIUM: Magnesium: 2 mg/dL (ref 1.7–2.4)

## 2017-09-26 LAB — PHOSPHORUS: Phosphorus: 3.3 mg/dL (ref 2.5–4.6)

## 2017-09-26 MED ORDER — TRAVASOL 10 % IV SOLN
INTRAVENOUS | Status: AC
  Administered 2017-09-26: 17:00:00 via INTRAVENOUS
  Filled 2017-09-26: qty 904.8

## 2017-09-26 MED ORDER — HYDROCODONE-ACETAMINOPHEN 5-325 MG PO TABS
1.0000 | ORAL_TABLET | ORAL | Status: DC | PRN
  Filled 2017-09-26: qty 2

## 2017-09-26 MED ORDER — HYDRALAZINE HCL 20 MG/ML IJ SOLN: 5.0000 mg | Freq: Four times a day (QID) | INTRAMUSCULAR | Status: DC | PRN

## 2017-09-26 MED ORDER — MORPHINE SULFATE (PF) 4 MG/ML IV SOLN
2.0000 mg | INTRAVENOUS | Status: DC | PRN
  Administered 2017-09-26: 2 mg via INTRAVENOUS
  Administered 2017-09-27: 4 mg via INTRAVENOUS
  Administered 2017-09-28 – 2017-09-29 (×2): 2 mg via INTRAVENOUS
  Administered 2017-09-29: 4 mg via INTRAVENOUS
  Filled 2017-09-26 (×6): qty 1

## 2017-09-26 NOTE — Progress Notes (Addendum)
Progress Note  Patient Name: Terri [name] Date of Encounter: 09/26/2017  Primary Cardiologist: new - Dr. Percival Spanish  Subjective   Patient is feeling well; denies chest pain, SOB, and palpitations.  Inpatient Medications    Scheduled Meds: . enoxaparin (LOVENOX) injection  40 mg Subcutaneous Q24H  . insulin aspart  0-15 Units Subcutaneous Q6H  . levothyroxine  50 mcg Intravenous Daily  . mouth rinse  15 mL Mouth Rinse BID  . methylPREDNISolone (SOLU-MEDROL) injection  40 mg Intravenous Daily  . metoprolol tartrate  2.5 mg Intravenous Q6H  . pantoprazole (PROTONIX) IV  40 mg Intravenous Q24H   Continuous Infusions: . diltiazem (CARDIZEM) infusion Stopped (09/25/17 0330)  . TPN ADULT (ION) 65 mL/hr at 09/25/17 1758   PRN Meds: [DISCONTINUED] acetaminophen **OR** acetaminophen, hydroxypropyl methylcellulose / hypromellose, iopamidol, ipratropium, levalbuterol, morphine injection, [DISCONTINUED] ondansetron **OR** ondansetron (ZOFRAN) IV, sodium chloride flush   Vital Signs    Vitals:   09/25/17 1238 09/25/17 1706 09/25/17 1948 09/26/17 0554  BP: (!) 124/52 140/60 135/67 (!) 153/71  Pulse: 82 79 78 88  Resp:   20 19  Temp: 97.9 F (36.6 C) 97.8 F (36.6 C) 97.7 F (36.5 C) 98.8 F (37.1 C)  TempSrc:  Oral Oral Oral  SpO2: 96% 95% 96% 98%  Weight:      Height:        Intake/Output Summary (Last 24 hours) at 09/26/2017 1057 Last data filed at 09/26/2017 0900 Gross per 24 hour  Intake 841.75 ml  Output 3300 ml  Net -2458.25 ml   Filed Weights   09/22/17 0645 09/23/17 1852 09/25/17 0645  Weight: 181 lb (82.1 kg) 182 lb 4.8 oz (82.7 kg) 187 lb 6.3 oz (85 kg)     Physical Exam   General: Well developed, well nourished, female appearing in no acute distress. Head: Normocephalic, atraumatic.  Neck: Supple without bruits, no JVD. Lungs:  Resp regular and unlabored, CTA in upper lobes, scattered crackles in bases Heart: RRR S1, S2, 4/6 systolic murmur; no  rub. Abdomen: Soft, non-tender, non-distended with normoactive bowel sounds. No hepatomegaly. No rebound/guarding. No obvious abdominal masses. Extremities: No clubbing, cyanosis, no edema. Distal pedal pulses are 2+ bilaterally. Neuro: Alert and oriented X 3. Moves all extremities spontaneously. Psych: Normal affect.  Labs    Chemistry Recent Labs  Lab 09/21/17 0353 09/22/17 0452  09/24/17 0431 09/24/17 1613 09/25/17 0633 09/26/17 0501  NA 140 138   < > 137 134* 134* 134*  K 4.4 3.9   < > 4.2 3.8 4.5 5.0  CL 108 106   < > 103 101 102 100*  CO2 23 26   < > 28 25 27 29   GLUCOSE 89 139*   < > 118* 242* 129* 140*  BUN 16 19   < > 29* 28* 37* 33*  CREATININE 1.11* 1.01*   < > 0.81 0.86 0.99 1.02*  CALCIUM 7.9* 8.1*   < > 8.1* 7.4* 7.7* 7.9*  PROT 4.9* 5.0*  --  5.0*  --   --   --   ALBUMIN 2.5* 2.5*  --  2.6*  --   --   --   AST 16 15  --  15  --   --   --   ALT 11* 11*  --  10*  --   --   --   ALKPHOS 45 45  --  45  --   --   --   BILITOT 0.5 0.4  --  0.5  --   --   --   GFRNONAA 44* 50*   < > >60 >60 51* 49*  GFRAA 51* 58*   < > >60 >60 59* 57*  ANIONGAP 9 6   < > 6 8 5 5    < > = values in this interval not displayed.     Hematology Recent Labs  Lab 09/24/17 0431 09/25/17 0633 09/26/17 0501  WBC 12.2* 21.9* 21.2*  RBC 3.66* 3.65* 3.58*  HGB 10.2* 10.5* 10.0*  HCT 32.4* 32.8* 31.7*  MCV 88.5 89.9 88.5  MCH 27.9 28.8 27.9  MCHC 31.5 32.0 31.5  RDW 14.2 14.7 14.3  PLT 152 133* 125*    Cardiac EnzymesNo results for input(s): TROPONINI in the last 168 hours. No results for input(s): TROPIPOC in the last 168 hours.   BNPNo results for input(s): BNP, PROBNP in the last 168 hours.   DDimer No results for input(s): DDIMER in the last 168 hours.   Radiology    Dg Chest Port 1 View  Result Date: 09/25/2017 CLINICAL DATA:  Atelectasis. EXAM: PORTABLE CHEST 1 VIEW COMPARISON:  09/16/2017. FINDINGS: Interim placement NG tube, its tip is below left hemidiaphragm.  Interim placement right PICC line, its tip is in the upper portion the right atrium. Mild cardiomegaly and pulmonary venous congestion. Mild basilar atelectasis. No prominent pleural effusion or pneumothorax. IMPRESSION: 1. Interim placement NG tube, its tip is below left hemidiaphragm. Interim placement of right PICC line, its tip is in the upper portion of the right atrium. 2. Mild cardiomegaly and pulmonary venous congestion. 3. Mild basilar atelectasis . Electronically Signed   By: Marcello Moores  Register   On: 09/25/2017 06:57     Telemetry    Sinus rhythm, unknown when she converted - Personally Reviewed  ECG    New tracing pending to document NSR - Personally Reviewed   Cardiac Studies   Echo: 9/18  Study Conclusions - Left ventricle: The cavity size was normal. Systolic function was normal. The estimated ejection fraction was in the range of 55% to 60%. Wall motion was normal; there were no regional wall motion abnormalities. Doppler parameters are consistent with both elevated ventricular end-diastolic filling pressure and elevated left atrial filling pressure. - Aortic valve: There was mild regurgitation. - Left atrium: The atrium was mildly dilated. - Atrial septum: No defect or patent foramen ovale was identified. - Pulmonary arteries: PA peak pressure: 31 mm Hg (S).   Patient Profile     83 y.o. female with PMH of HTN, HL, hypothyroidism, Crohn's disease, TIA, AS, AKI who presented with abd pain and found to have a SBO.  Assessment & Plan    1. New onset Afib with RVR - diltiazem drip weaned off - she converted to NSR - will discuss need for anticoagulation with attending as she is back in NSR and is not an AC candidate today given her post-operative state and NPO status This patients CHA2DS2-VASc Score and unadjusted Ischemic Stroke Rate (% per year) is equal to 9.7 % stroke rate/year from a score of 2 (age, stroke, female, HTN)  2. SBO 2/2 Crohn's  disease - NGT in place and TPN running - clear liquid diet, no pills - per primary  3. Mild AS - states she was told she had rheumatic fever as a child - murmur appreciated on exam  4. HTN - pressure increased now that diltiazem has been stopped - added PRN hydralazine if needed  Signed, Ledora Bottcher ,  PA-C 10:57 AM 09/26/2017 Pager: [phone]  History and all data above reviewed.  Patient examined.  No chest pain.  No SOB.  She is starting to take some POs.    I agree with the findings as above.  The patient exam reveals COR:RRR  ,  Lungs: Clear  ,  Abd: Decreased bowel sounds, Ext Mild edema  .  All available labs, radiology testing, previous records reviewed. Agree with documented assessment and plan. Atrial fib:  She is back in NSR.  I am not going to commit her to chronic anticoagulation at this point.  However, she would need a DOAC if she has recurrent fib.  I discussed this with her granddaughter who is a pediatrician and the retired Marine scientist patient.  Otherwise no further cardiac work up.  I talked to her about getting an AliveCor to monitor her rhythm at home.  I will sign off but arrange follow up in a month in our office.    Terri [name]  12:11 PM  09/26/2017

## 2017-09-26 NOTE — Consult Note (Signed)
[place] Nurse ostomy follow up Stoma type/location:  Ileostomy surgery performed on 11/12 Stomal assessment/size: Stoma is red and viable, flush with skin level, 1 inch Peristomal assessment: Intact skin surrounding Output: Small amt liquid brown stool  Ostomy pouching: 1pc.  Education provided:  Previous pouch is leaking behind the barrier. Demonstrated pouch change using one piece pouch and barrier ring to attempt to maintain a seal.  No family present and patient states she is legally blind and will not be able to see to apply the pouch in the correct location.  Described the steps of application and using barrier ring, and pt was able to open and close the velcro to empty without assistance.  Educational materials in the room and patient states some family members will come on Fri for another teaching session, none present today. Supplies ordered to the room for staff nurse use.  Prevena dressing in place with cont suction at 186mm to abd wound. Enrolled patient in [place] Start Discharge program: Yes Julien Girt MSN, RN, [place], [place], [place]

## 2017-09-26 NOTE — Progress Notes (Signed)
Nutrition Follow-up  DOCUMENTATION CODES:   Not applicable  INTERVENTION:   -TPN management per pharmacy  NUTRITION DIAGNOSIS:   Inadequate oral intake related to altered GI function as evidenced by NPO status.  Ongoing  GOAL:   Patient will meet greater than or equal to 90% of their needs  Met with TPN  MONITOR:   Diet advancement, Labs, Weight trends, Skin, I & O's  REASON FOR ASSESSMENT:   Consult New TPN/TNA  ASSESSMENT:   Terri [name] is a 83 y.o. female with medical history significant of Crohn's disease diagnosed x5 years ago.  SBO in July this year felt to be secondary to Crohn's disease.  SBO resolved / improved after NGT and steroids though she has been having some abdominal symptoms in the intervening time.  Patient presents to the ED with c/o nausea, abd distention, concern for another SBO.  11/6- s/p omental mass biopsy with IR 11/8- per GI notes, KUB reveals persistent dilation of small bowel loops with gas in colon 11/9- PICC placed 11/12- Exploratory laparotomy, omentectomy, resection of ileum and cecum  Pt remains with NGT; clamped on 09/25/17.   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 Palliative care has been consulted; awaiting oncology input to discuss plan for goals of care.   Pt remains TPN dependent; receiving TPN @ 65 ml/hr, which provides 1679 kcals and 90 grams protein, meeting 100% of estimated needs.   Medications reviewed and include solu-medrol.   Labs reviewed: Na: 134, CBGS: 144-168 (inpatient orders for glycemic control are 0-15 units insulin aspart every 6 hours).   Diet Order:  Diet NPO time specified Except for: Ice Chips, Sips with Meds TPN ADULT (ION)  EDUCATION NEEDS:   Education needs have been addressed  Skin:  Skin Assessment: Skin Integrity Issues: Skin Integrity Issues:: Wound VAC Wound Vac: abdominal incision  Last BM:  09/26/17 (150 ml output via  colostomy)  Height:   Ht Readings from Last 1 Encounters:  09/16/17 5' 2"  (1.575 m)    Weight:   Wt Readings from Last 1 Encounters:  09/25/17 187 lb 6.3 oz (85 kg)    Ideal Body Weight:  50 kg  BMI:  Body mass index is 34.27 kg/m.  Estimated Nutritional Needs:   Kcal:  1600-1800  Protein:  80-95 grams  Fluid:  1.6-1.8 L    Elige Shouse A. Jimmye Norman, RD, LDN, CDE Pager: [phone] After hours Pager: [phone]

## 2017-09-26 NOTE — Care Management Note (Signed)
Case Management Note  Patient Details  Name: Sowmya Partridge MRN: [id number] Date of Birth: 1931/07/01  Subjective/Objective:                    Action/Plan:   Expected Discharge Date:  09/21/17               Expected Discharge Plan:  [place]  In-House Referral:     Discharge planning Services  CM Consult  Post Acute Care Choice:  Home Health Choice offered to:  Patient  DME Arranged:    DME Agency:     HH Arranged:  RN [place] Agency:  [facility]  Status of Service:  Completed, signed off  If discussed at [place] of Stay Meetings, dates discussed:    Additional Comments:  Marilu Favre, RN 09/26/2017, 12:16 PM

## 2017-09-26 NOTE — Progress Notes (Signed)
Daily Progress Note   Patient Name: Terri [name]       Date: 09/26/2017 DOB: 1930/12/05  Age: 83 y.o. MRN#: [id number] Attending Physician: Mariel Aloe, MD Primary Care Physician: Prince Solian, MD Admit Date: 09/16/2017  Reason for Consultation/Follow-up: Establishing goals of care  Subjective:  Patient mentions she has pain in her abdomen, but is going to get up and walk today.  She did not use pain medicine overnight but requested it this morning in order to walk.  She prefers to use it only sparingly.  We discussed code status.  She says clearly I do not want to be coded and my family knows that.  With regards to goals - the patient would like to be alive in 6 months - but "I don't want to be dependent on anyone else to wipe my butt".  "I do not want to be kept alive to be miserable".  Pathology has just resulted.  Oncology consult is pending.  Assessment: Retired Retail buyer.  S/P ex-lap.  New diagnosis of metastatic adenocarcinoma located in the bowel, omentum and peritoneum.  Patient was independent with ADLs prior to admission.  Depending on her choices regarding treatment - she may do well with Hospice Services at her home (her son's home) on discharge.   Patient Profile/HPI:  83 y.o. female  with past medical history of rheumatic heart disease, PUD, TIA, AS and presumed crohns who was admitted on 09/16/2017 with high grade SBO.  She underwent exploratory lap on 11/12.  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  She developed atrial fibrillation perioperatively and has been managed on a diltiazem gtt.      Length of Stay: 10  Current Medications: Scheduled Meds:  .  enoxaparin (LOVENOX) injection  40 mg Subcutaneous Q24H  . insulin aspart  0-15 Units Subcutaneous Q6H  . levothyroxine  50 mcg Intravenous Daily  . mouth rinse  15 mL Mouth Rinse BID  . methylPREDNISolone (SOLU-MEDROL) injection  40 mg Intravenous Daily  . metoprolol tartrate  2.5 mg Intravenous Q6H  . pantoprazole (PROTONIX) IV  40 mg Intravenous Q24H    Continuous Infusions: . diltiazem (CARDIZEM) infusion Stopped (09/25/17 0330)  . TPN ADULT (ION) 65 mL/hr at 09/25/17 1758  PRN Meds: [DISCONTINUED] acetaminophen **OR** acetaminophen, hydroxypropyl methylcellulose / hypromellose, iopamidol, ipratropium, levalbuterol, morphine injection, [DISCONTINUED] ondansetron **OR** ondansetron (ZOFRAN) IV, sodium chloride flush  Physical Exam        Well developed.  In recliner.  N/G in place.   NAD.  A&O coherent, appropriate  Vital Signs: BP (!) 153/71 (BP Location: Left Arm)   Pulse 88   Temp 98.8 F (37.1 C) (Oral)   Resp 19   Ht 5\' 2"  (1.575 m)   Wt 85 kg (187 lb 6.3 oz)   SpO2 98%   BMI 34.27 kg/m  SpO2: SpO2: 98 % O2 Device: O2 Device: Nasal Cannula O2 Flow Rate: O2 Flow Rate (L/min): 2 L/min  Intake/output summary:   Intake/Output Summary (Last 24 hours) at 09/26/2017 1057 Last data filed at 09/26/2017 0900 Gross per 24 hour  Intake 841.75 ml  Output 3300 ml  Net -2458.25 ml   LBM: Last BM Date: 09/25/17 Baseline Weight: Weight: 72.6 kg (160 lb) Most recent weight: Weight: 85 kg (187 lb 6.3 oz)       Palliative Assessment/Data:  30%      Patient Active Problem List   Diagnosis Date Noted  . Abdominal carcinomatosis ([place])   . Palliative care encounter   . Goals of care, counseling/discussion   . Foreign body of small intestine, subsequent encounter 09/16/2017  . Abnormal CT of the abdomen 09/16/2017  . SBO (small bowel obstruction) ([place]) 05/31/2017  . Crohn's disease ([place]) 05/31/2017  . Hx-TIA (transient ischemic attack) 05/31/2017  . HOH (hard of  hearing) 05/31/2017  . AKI (acute kidney injury) ([place]) 05/31/2017  . Leukocytosis 05/31/2017  . Bilious vomiting with nausea   . Crohn's ileitis, with intestinal obstruction ([place])   . Dehydration     Palliative Care Plan    Recommendations/Plan:  PMT will follow up tomorrow after oncology consultation to meet with patient and family for [place].   Code Status:  DNR with FULL SCOPE TREATMENT.  Prognosis:   < 6 months (but will defer to Oncology).  Metastatic adenocarcinoma with carcinomatosis in a patient who seems likely not to want to go thru a long period of illness.   Discharge Planning:  To Be Determined.  Potentially home with hospice pending her treatment options and decisions.  If she opts for not chemo or radiation she will be eligible for hospice services at home.  Care plan was discussed with attending physician.  Thank you for allowing the Palliative Medicine Team to assist in the care of this patient.  Total time spent:  35 min.     Greater than 50%  of this time was spent counseling and coordinating care related to the above assessment and plan.  Florentina Jenny, PA-C Palliative Medicine  Please contact Palliative MedicineTeam phone at [phone] for questions and concerns between 7 am - 7 pm.   Please see AMION for individual provider pager numbers.

## 2017-09-26 NOTE — [place] Notes (Signed)
[place] NOTE   Pharmacy Consult for TPN Indication: prolonged ileus  Patient Measurements: Height: 5' 2"  (157.5 cm) Weight: 187 lb 6.3 oz (85 kg) IBW/kg (Calculated) : 50.1 TPN AdjBW (KG): 55.7 Body mass index is 34.27 kg/m.  Assessment:  61 yof with recent hx of SBO in July secondary to Crohn's disease admitted. SBO resolved / improved after NGT and steroids, though has had some abdominal sxs in the intervening time.Patient admitted with N/V, abd distention, found to have another SBO. CT scan on 10/23 showed findings suggestive of Crohns, omental nodularity worrisome for malignancy (unable to have biopsy at [facility] this week due to inflammation/SBO), and foreign body in terminal ilium, possibly a tablet present since CT scan in July.  Significant events: 11/12: Ex Lap s/p ileocecectomy with end ileostomy   GI: Crohn's dz, high-grade SBO, ommental nodularity (newly diagnosed metatstatic adenocarcinoma - biopsy pending). Foreign body in small intestine. Went to OR on 11/12 for ex lap. Found several peritoneal implants. Foreign body was removed. Albumin low at 2.6. Pre-albumin 12.4. Abdomen distension ongoing? last BM 11/13. NGT in place & clamped. Ileostomy output down to 650 mL. Folic acid, PPI Endo: No DM hx - CBGs remain controlled but trending up since TPN started (144-168) Noted on Solu-medrol reduced to 22m daily. Synthroid Insulin requirements in the past 24 hours: 8 units of SSI + 15 units in TPN Lytes:  Na stable at 134, K 3.8>4.5>5, Phos improved to 3.3, Mg improved to 2, CoCa 8.6. Renal: SCr 0.86>0.99>1.02, CrCl~51. BUN up to 33. UOP 0.4 mL/kg/hr, -2.5L / 24 hrs. Pulm: 98% on 2L [place]  Cards: VSS in NSR - on scheduled IV metoprolol  Hepatobil: LFTs/Tbili/Alk phos wnl. Trig ok at 88. Neuro: morph prn ID: Previously completed 668mof INH for prior +TB test. ID felt no further tx necessary. WBC 21.2, Afebrile - no abx  Best Practices: enox TPN  Access: PICC Double lumen TPN start date: 09/21/17 >>  Nutritional Goals (per RD recommendation on 11/9): KCal:  1600-1800 kcal Protein:  80-95 g Fluid: 1.6-1.8 L  Current Nutrition:  NPO TPN  Plan:  Continue TPN at 65 ml/hr TPN provides 90 g of protein, 250g of dextrose, and 47g lipids providing 1679 total kcal, which meets ~100% of patient needs Electrolytes in TPN: Cl:Ac ratio 2:1. K down to 25 mEq/L, Na up to 85 mEq/L Add MVI, trace elements, folic acid 62m22mand continue to add 15 units of regular insulin in TPN Continue moderate SSI q6h and adjust as needed Monitor TPN labs, recheck labs tomorrow, clinical progress F/U GOC   BenAlbertina ParrharmD., BCPS Clinical Pharmacist Pager [phone]

## 2017-09-26 NOTE — Progress Notes (Signed)
2 Days Post-Op   Subjective/Chief Complaint: NG has been clamped all night - no nausea Pain better controlled Good ileostomy output   Objective: Vital signs in last 24 hours: Temp:  [97.7 F (36.5 C)-98.8 F (37.1 C)] 98.1 F (36.7 C) (11/14 1256) Pulse Rate:  [78-88] 85 (11/14 1256) Resp:  [18-20] 18 (11/14 1256) BP: (135-153)/(60-71) 141/60 (11/14 1256) SpO2:  [94 %-98 %] 94 % (11/14 1256) Last BM Date: 09/26/17  Intake/Output from previous day: 11/13 0701 - 11/14 0700 In: 1046.8 [I.V.:1046.8] Out: 3645 [Urine:2995; Stool:650] Intake/Output this shift: Total I/O In: 0  Out: 180 [Stool:180]  General appearance: alert, cooperative and no distress Lungs - CTA B CV - RRR Abd - soft, + BS; incisional VAC in place with good seal; Pink ileostomy with brownish liquid output  Lab Results:  Recent Labs    09/25/17 0633 09/26/17 0501  WBC 21.9* 21.2*  HGB 10.5* 10.0*  HCT 32.8* 31.7*  PLT 133* 125*   BMET Recent Labs    09/25/17 0633 09/26/17 0501  NA 134* 134*  K 4.5 5.0  CL 102 100*  CO2 27 29  GLUCOSE 129* 140*  BUN 37* 33*  CREATININE 0.99 1.02*  CALCIUM 7.7* 7.9*   PT/INR No results for input(s): LABPROT, INR in the last 72 hours. ABG No results for input(s): PHART, HCO3 in the last 72 hours.  Invalid input(s): PCO2, PO2  Studies/Results: Dg Chest Port 1 View  Result Date: 09/25/2017 CLINICAL DATA:  Atelectasis. EXAM: PORTABLE CHEST 1 VIEW COMPARISON:  09/16/2017. FINDINGS: Interim placement NG tube, its tip is below left hemidiaphragm. Interim placement right PICC line, its tip is in the upper portion the right atrium. Mild cardiomegaly and pulmonary venous congestion. Mild basilar atelectasis. No prominent pleural effusion or pneumothorax. IMPRESSION: 1. Interim placement NG tube, its tip is below left hemidiaphragm. Interim placement of right PICC line, its tip is in the upper portion of the right atrium. 2. Mild cardiomegaly and pulmonary venous  congestion. 3. Mild basilar atelectasis . Electronically Signed   By: Marcello Moores  Register   On: 09/25/2017 06:57    Anti-infectives: Anti-infectives (From admission, onward)   Start     Dose/Rate Route Frequency Ordered Stop   09/24/17 1030  cefoTEtan in Dextrose 5% (CEFOTAN) 2-2.08 GM-% IVPB  Status:  Discontinued    Comments:  Hogue, Samantha   : cabinet override      09/24/17 1030 09/24/17 1036   09/24/17 1023  dextrose 5 % with cefOXitin (MEFOXIN) ADS Med  Status:  Discontinued    Comments:  Hogue, Samantha   : cabinet override      09/24/17 1023 09/24/17 1033   09/24/17 0800  cefoTEtan (CEFOTAN) 1 g in dextrose 5 % 50 mL IVPB     1 g 100 mL/hr over 30 Minutes Intravenous To Short Stay 09/23/17 2310 09/24/17 1149     Pathology - 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 Abnormal omental pattern on CT Metastatic adenocarcinoma with peritoneal carcinomatosis - SB primary - pathology report discussed with patient.  Copy left at bedside for the family members S/P exploratory laparotomy/ ileocecectomy with end ileostomy  Malnutrition - Prealbumin 11.3: PICC line/TNA starting11/9/18per Dr. Tana Coast => prealbumin up to 12.4 ? Fluid overload - CXR with PICC; History of GERD/duodenal ulcers- Protonix Mild Renal insufficiency- improving Reported  mild aortic stenosis. Hypertension Hypothyroid-synthroid Remote history of rheumatic heart disease.  History of tobacco use History of TIA.  Fen: TNA/ remove NG; clear liquids Palliative Care/ Oncology consults for peritoneal carcinomatosis WOCN consult for ostomy care Will need [facility] for ostomy care DVT: SCD/Lovenox    LOS: 10 days    Pearley Baranek K. 09/26/2017

## 2017-09-26 NOTE — Progress Notes (Signed)
PROGRESS NOTE    Terri [name]  GLO:[id number] DOB: April 19, 1931 DOA: 09/16/2017 PCP: Prince Solian, MD   Brief Narrative: Terri [name] is a 83 y.o. female with a history of Crohn's disease. She presented with recurrent SBO and found to have metastatic cancer with carcinomatosis.   Assessment & Plan:   Principal Problem:   SBO (small bowel obstruction) (HCC) Active Problems:   Crohn's disease ([place])   Crohn's ileitis, with intestinal obstruction ([place])   Foreign body of small intestine, subsequent encounter   Abnormal CT of the abdomen   Abdominal carcinomatosis [facility])   Palliative care encounter   Goals of care, counseling/discussion   Small bowel obstruction Recurrent. Secondary to metastatic adenocarcinoma with carcinomatosis. S/p ex lap/ileocecectomy and end ileostomy. -General surgery recommendations: TNA, advancing diet -watch for refeeding syndrome  Omental nodularity Metastatic adenocarcinoma with peritoneal carcinomatosis Biopsy returned with confirmed invasive metastatic adenocarcinoma -oncology recommendations  Crohn's disease No flair. Stable.  Foreign body of small intestine 17 mm object noted on CT abdomen/pelvis -General surgery recommendations  Hypothyroidism -Continue Synthroid IV  Atrial fibrillation with RVR Intraoperative. Returned to sinus rhythm.  Leukocytosis Secondary to tumor burden. No evidence of infection.    DVT prophylaxis: Lovenox Code Status: DNR Family Communication: Grand-daughter in law at bedside Disposition Plan: Discharge pending diet advancement and general surgery/oncology recommendations   Consultants:   General surgery  Oncology  Interventional radiology  Gastroenterology  Palliative care medicine  Procedures:   Ex lap, omentectomy, Ileocecectomy, Ileostomy  Antimicrobials:  None    Subjective: Mild intermittent pain. Ready to take by mouth nutrition  Objective: Vitals:   09/25/17  1238 09/25/17 1706 09/25/17 1948 09/26/17 0554  BP: (!) 124/52 140/60 135/67 (!) 153/71  Pulse: 82 79 78 88  Resp:   20 19  Temp: 97.9 F (36.6 C) 97.8 F (36.6 C) 97.7 F (36.5 C) 98.8 F (37.1 C)  TempSrc:  Oral Oral Oral  SpO2: 96% 95% 96% 98%  Weight:      Height:        Intake/Output Summary (Last 24 hours) at 09/26/2017 1253 Last data filed at 09/26/2017 1152 Gross per 24 hour  Intake 776.75 ml  Output 3330 ml  Net -2553.25 ml   Filed Weights   09/22/17 0645 09/23/17 1852 09/25/17 0645  Weight: 82.1 kg (181 lb) 82.7 kg (182 lb 4.8 oz) 85 kg (187 lb 6.3 oz)    Examination:  General exam: Appears calm and comfortable Respiratory system: Clear to auscultation. Respiratory effort normal. Cardiovascular system: S1 & S2 heard, RRR. No murmurs, rubs, gallops or clicks. Gastrointestinal system: Abdomen is distendedd, soft and mildly tender. Normal bowel sounds heard. Ostomy with brown liquid stool Central nervous system: Alert and oriented. No focal neurological deficits. Extremities: No edema. No calf tenderness Skin: No cyanosis. No rashes Psychiatry: Judgement and insight appear normal. Mood & affect appropriate.     Data Reviewed: I have personally reviewed following labs and imaging studies  CBC: Recent Labs  Lab 09/22/17 0452 09/23/17 0452 09/24/17 0431 09/25/17 0633 09/26/17 0501  WBC 10.6* 13.6* 12.2* 21.9* 21.2*  NEUTROABS 7.4  --  8.4*  --   --   HGB 10.1* 10.2* 10.2* 10.5* 10.0*  HCT 31.9* 31.9* 32.4* 32.8* 31.7*  MCV 88.9 87.9 88.5 89.9 88.5  PLT 181 176 152 133* 188*   Basic Metabolic Panel: Recent Labs  Lab 09/22/17 0452 09/23/17 0452 09/24/17 0431 09/24/17 1613 09/25/17 0633 09/26/17 0501  NA 138 138 137 134* 134* 134*  K 3.9 4.1 4.2 3.8 4.5 5.0  CL 106 105 103 101 102 100*  CO2 26 28 28 25 27 29   GLUCOSE 139* 133* 118* 242* 129* 140*  BUN 19 24* 29* 28* 37* 33*  CREATININE 1.01* 0.90 0.81 0.86 0.99 1.02*  CALCIUM 8.1* 8.0* 8.1*  7.4* 7.7* 7.9*  MG 1.3* 2.0 1.8 1.5*  --  2.0  PHOS 3.2  --  2.7 2.3*  --  3.3   GFR: Estimated Creatinine Clearance: 41.5 mL/min (A) (by C-G formula based on SCr of 1.02 mg/dL (H)). Liver Function Tests: Recent Labs  Lab 09/21/17 0353 09/22/17 0452 09/24/17 0431  AST 16 15 15   ALT 11* 11* 10*  ALKPHOS 45 45 45  BILITOT 0.5 0.4 0.5  PROT 4.9* 5.0* 5.0*  ALBUMIN 2.5* 2.5* 2.6*   No results for input(s): LIPASE, AMYLASE in the last 168 hours. No results for input(s): AMMONIA in the last 168 hours. Coagulation Profile: No results for input(s): INR, PROTIME in the last 168 hours. Cardiac Enzymes: No results for input(s): CKTOTAL, CKMB, CKMBINDEX, TROPONINI in the last 168 hours. BNP (last 3 results) No results for input(s): PROBNP in the last 8760 hours. HbA1C: No results for input(s): HGBA1C in the last 72 hours. CBG: Recent Labs  Lab 09/25/17 1208 09/25/17 1819 09/25/17 2351 09/26/17 0541 09/26/17 1210  GLUCAP 154* 168* 152* 144* 139*   Lipid Profile: Recent Labs    09/24/17 0431  TRIG 88   Thyroid Function Tests: Recent Labs    09/25/17 0633  TSH 1.038   Anemia Panel: No results for input(s): VITAMINB12, FOLATE, FERRITIN, TIBC, IRON, RETICCTPCT in the last 72 hours. Sepsis Labs: No results for input(s): PROCALCITON, LATICACIDVEN in the last 168 hours.  Recent Results (from the past 240 hour(s))  Surgical pcr screen     Status: None   Collection Time: 09/23/17 10:26 PM  Result Value Ref Range Status   MRSA, PCR NEGATIVE NEGATIVE Final   Staphylococcus aureus NEGATIVE NEGATIVE Final    Comment: (NOTE) The Xpert SA Assay (FDA approved for NASAL specimens in patients 20 years of age and older), is one component of a comprehensive surveillance program. It is not intended to diagnose infection nor to guide or monitor treatment.          Radiology Studies: Dg Chest Port 1 View  Result Date: 09/25/2017 CLINICAL DATA:  Atelectasis. EXAM: PORTABLE  CHEST 1 VIEW COMPARISON:  09/16/2017. FINDINGS: Interim placement NG tube, its tip is below left hemidiaphragm. Interim placement right PICC line, its tip is in the upper portion the right atrium. Mild cardiomegaly and pulmonary venous congestion. Mild basilar atelectasis. No prominent pleural effusion or pneumothorax. IMPRESSION: 1. Interim placement NG tube, its tip is below left hemidiaphragm. Interim placement of right PICC line, its tip is in the upper portion of the right atrium. 2. Mild cardiomegaly and pulmonary venous congestion. 3. Mild basilar atelectasis . Electronically Signed   By: Marcello Moores  Register   On: 09/25/2017 06:57        Scheduled Meds: . enoxaparin (LOVENOX) injection  40 mg Subcutaneous Q24H  . insulin aspart  0-15 Units Subcutaneous Q6H  . levothyroxine  50 mcg Intravenous Daily  . mouth rinse  15 mL Mouth Rinse BID  . methylPREDNISolone (SOLU-MEDROL) injection  40 mg Intravenous Daily  . metoprolol tartrate  2.5 mg Intravenous Q6H  . pantoprazole (PROTONIX) IV  40 mg Intravenous Q24H   Continuous Infusions: . diltiazem (CARDIZEM) infusion Stopped (09/25/17 0330)  .  TPN ADULT (ION) 65 mL/hr at 09/25/17 1758  . TPN ADULT (ION)       LOS: 10 days     Cordelia Poche, MD [facility] 09/26/2017, 12:53 PM Pager: [phone]  If 7PM-7AM, please contact night-coverage [email or web address] Password [facility] 09/26/2017, 12:53 PM

## 2017-09-27 ENCOUNTER — Encounter: Payer: Self-pay | Admitting: Oncology

## 2017-09-27 ENCOUNTER — Encounter: Payer: Self-pay | Admitting: Hematology

## 2017-09-27 ENCOUNTER — Telehealth: Payer: Self-pay | Admitting: Oncology

## 2017-09-27 DIAGNOSIS — K50919 Crohn's disease, unspecified, with unspecified complications: Secondary | ICD-10-CM

## 2017-09-27 DIAGNOSIS — J9811 Atelectasis: Secondary | ICD-10-CM

## 2017-09-27 DIAGNOSIS — C762 Malignant neoplasm of abdomen: Secondary | ICD-10-CM

## 2017-09-27 LAB — COMPREHENSIVE METABOLIC PANEL
ALK PHOS: 55 U/L (ref 38–126)
ALT: 12 U/L — ABNORMAL LOW (ref 14–54)
ANION GAP: 5 (ref 5–15)
AST: 16 U/L (ref 15–41)
Albumin: 2.2 g/dL — ABNORMAL LOW (ref 3.5–5.0)
BILIRUBIN TOTAL: 0.5 mg/dL (ref 0.3–1.2)
BUN: 28 mg/dL — ABNORMAL HIGH (ref 6–20)
CALCIUM: 8 mg/dL — AB (ref 8.9–10.3)
CO2: 30 mmol/L (ref 22–32)
CREATININE: 0.9 mg/dL (ref 0.44–1.00)
Chloride: 97 mmol/L — ABNORMAL LOW (ref 101–111)
GFR, EST NON AFRICAN AMERICAN: 57 mL/min — AB (ref >60)
Glucose, Bld: 123 mg/dL — ABNORMAL HIGH (ref 65–99)
Potassium: 4.8 mmol/L (ref 3.5–5.1)
SODIUM: 132 mmol/L — AB (ref 135–145)
TOTAL PROTEIN: 4.8 g/dL — AB (ref 6.5–8.1)

## 2017-09-27 LAB — CBC
HCT: 29.1 % — ABNORMAL LOW (ref 36.0–46.0)
Hemoglobin: 9.2 g/dL — ABNORMAL LOW (ref 12.0–15.0)
MCH: 27.6 pg (ref 26.0–34.0)
MCHC: 31.6 g/dL (ref 30.0–36.0)
MCV: 87.4 fL (ref 78.0–100.0)
PLATELETS: 131 10*3/uL — AB (ref 150–400)
RBC: 3.33 MIL/uL — ABNORMAL LOW (ref 3.87–5.11)
RDW: 14.1 % (ref 11.5–15.5)
WBC: 21.3 10*3/uL — AB (ref 4.0–10.5)

## 2017-09-27 LAB — GLUCOSE, CAPILLARY
GLUCOSE-CAPILLARY: 143 mg/dL — AB (ref 65–99)
GLUCOSE-CAPILLARY: 211 mg/dL — AB (ref 65–99)
Glucose-Capillary: 136 mg/dL — ABNORMAL HIGH (ref 65–99)

## 2017-09-27 LAB — MAGNESIUM: MAGNESIUM: 1.6 mg/dL — AB (ref 1.7–2.4)

## 2017-09-27 LAB — PHOSPHORUS: PHOSPHORUS: 3.3 mg/dL (ref 2.5–4.6)

## 2017-09-27 MED ORDER — ACETAMINOPHEN-CODEINE #3 300-30 MG PO TABS
1.0000 | ORAL_TABLET | ORAL | Status: DC | PRN
  Administered 2017-09-27 – 2017-09-28 (×4): 2 via ORAL
  Filled 2017-09-27 (×4): qty 2

## 2017-09-27 MED ORDER — TRAVASOL 10 % IV SOLN
INTRAVENOUS | Status: DC
  Filled 2017-09-27: qty 904.8

## 2017-09-27 MED ORDER — ENSURE ENLIVE PO LIQD
237.0000 mL | Freq: Two times a day (BID) | ORAL | Status: DC
  Administered 2017-09-27 – 2017-09-29 (×4): 237 mL via ORAL

## 2017-09-27 MED ORDER — ACETAMINOPHEN-CODEINE #3 300-30 MG PO TABS: 1.0000 | ORAL_TABLET | ORAL | Status: DC | PRN

## 2017-09-27 MED ORDER — MAGNESIUM SULFATE 2 GM/50ML IV SOLN
2.0000 g | Freq: Once | INTRAVENOUS | Status: AC
  Administered 2017-09-27: 2 g via INTRAVENOUS
  Filled 2017-09-27: qty 50

## 2017-09-27 MED ORDER — LEVOTHYROXINE SODIUM 100 MCG PO TABS
100.0000 ug | ORAL_TABLET | Freq: Every day | ORAL | Status: DC
  Administered 2017-09-28 – 2017-09-30 (×3): 100 ug via ORAL
  Filled 2017-09-27 (×3): qty 1

## 2017-09-27 NOTE — Progress Notes (Signed)
Met at bedside with patient.  She asked about prognosis.  I explained that with adenocarcinoma with carcinomatosis her prognosis is less than 6 months - without treatment.  This seemed to hit her pretty hard.  She looks forward to meeting with oncology after Thanksgiving.   She is agreeable to Palliative Care following her outpatient.  We discussed going home on oral pain meds.  Tylenol 3 is already on her orders.   PE: Well developed, alert, orientated, coherent, appropriate. CV rrr with sys murmur Resp no distress.  No w/c/r. Abdomen not examined - bowel sounds heard  Prognosis:  Months given metastatic adenocarcinoma with peritoneal carcinomatosis - if she opts for no treatment.  Hospice eligible if she opts for no chemo or radiation.  Plan:  Palliative Care to follow outpatient. We will sign off.   Please call if we can be of assistance.  Florentina Jenny, PA-C Palliative Medicine Pager: [phone] Office [phone]

## 2017-09-27 NOTE — Progress Notes (Addendum)
PHARMACY - ADULT TOTAL PARENTERAL NUTRITION CONSULT NOTE   Pharmacy Consult for TPN Indication: prolonged ileus  Patient Measurements: Height: 5' 2"  (157.5 cm) Weight: 189 lb 9.5 oz (86 kg) IBW/kg (Calculated) : 50.1 TPN AdjBW (KG): 55.7 Body mass index is 34.68 kg/m.  Assessment:  54 yof with recent hx of SBO in July secondary to Crohn's disease admitted. SBO resolved / improved after NGT and steroids, though has had some abdominal sxs in the intervening time.Patient admitted with N/V, abd distention, found to have another SBO. CT scan on 10/23 showed findings suggestive of Crohns, omental nodularity worrisome for malignancy (unable to have biopsy at [facility] this week due to inflammation/SBO), and foreign body in terminal ilium, possibly a tablet present since CT scan in July.  Significant events: 11/12: Ex Lap s/p ileocecectomy with end ileostomy   GI: Crohn's dz, high-grade SBO, ommental nodularity (newly diagnosed metatstatic adenocarcinoma - biopsy pending). Foreign body in small intestine. Went to OR on 11/12 for ex lap. Found several peritoneal implants. Foreign body was removed. Albumin low at 2.6. Pre-albumin 12.4. NGT removed yesterday. Ileostomy output down to 330 mL. Tolerating clear liquid diet today, Folic acid, PPI Endo: No DM hx - CBGs remain controlled but trending up since TPN started (136-172) Noted on Solu-medrol reduced to 22m daily. Synthroid Insulin requirements in the past 24 hours: 13 units of SSI + 15 units in TPN Lytes:  Na down 134>132, K 5>4.8, Phos stable 3.3, Mg down to 1.6, CoCa 9.04. Renal: SCr 0.9, CrCl~51. BUN 28. UOP not charted. Pulm: 98% on 2L White  Cards: VSS in NSR - on scheduled IV metoprolol  Hepatobil: LFTs/Tbili/Alk phos wnl. Trig ok at 88. Neuro: morph prn ID: Previously completed 66mof INH for prior +TB test. ID felt no further tx necessary. WBC 21.2, Afebrile - no abx  Best Practices: enox TPN Access: PICC Double lumen TPN start date: 09/21/17  >>  Nutritional Goals (per RD recommendation on 11/9): KCal:  1600-1800 kcal Protein:  80-95 g Fluid: 1.6-1.8 L  Current Nutrition:  Clear liquids TPN  Plan:  Continue TPN at 65 mL/hr  TPN provides 90 g of protein, 250g of dextrose, and 47g lipids providing 1679 total kcal, which meets ~100% of patient needs Electrolytes in TPN: Cl:Ac ratio- Max Cl, Na up to 110 mEq/L Magnesium sulfate 2 gm IV once  Add MVI, trace elements, folic acid 19m92mand continue to add 15 units of regular insulin in TPN Continue moderate SSI q6h and adjust as needed Monitor TPN labs, recheck labs tomorrow, clinical progress F/U GOC Advancing to full liquids today. Plan is to likely wean TPN tomorrow    BenAlbertina ParrharmD., BCPS Clinical Pharmacist Pager [phone]: Plans is to wean TPN to off today per surgery. Will decrease TPN rate to 30 mL/hr x 2 hours, then wean to off.  BenAlbertina ParrharmD., BCPS Clinical Pharmacist Pager [phone]

## 2017-09-27 NOTE — Progress Notes (Signed)
Brief discussion with the patient regarding the diagnosis of metastatic small bowel carcinoma.  Outpatient follow-up will be arranged at the Cancer center for the week of 10/08/2017.  Please call Oncology as needed while she is in the hospital.

## 2017-09-27 NOTE — Progress Notes (Signed)
PROGRESS NOTE    Terri [name]  ACZ:[id number] DOB: [birth year] DOA: 09/16/2017 PCP: Prince Solian, MD   Brief Narrative: Terri [name] is a 83 y.o. female with a history of Crohn's disease. She presented with recurrent SBO and found to have metastatic cancer with carcinomatosis.   Assessment & Plan:   Principal Problem:   SBO (small bowel obstruction) (HCC) Active Problems:   Crohn's disease ([place])   Crohn's ileitis, with intestinal obstruction ([place])   Foreign body of small intestine, subsequent encounter   Abnormal CT of the abdomen   Abdominal carcinomatosis [facility])   Palliative care encounter   Goals of care, counseling/discussion   Small bowel obstruction Recurrent. Secondary to metastatic adenocarcinoma with carcinomatosis. S/p ex lap/ileocecectomy and end ileostomy. -General surgery recommendations: TNA, advancing diet -watch for refeeding syndrome  Hypomagnesemia -replete today -repeat magnesium level tomorrow  Omental nodularity Metastatic adenocarcinoma with peritoneal carcinomatosis Biopsy returned with confirmed invasive metastatic adenocarcinoma -oncology recommendations: outpatient follow-up  Crohn's disease No flair. Stable. -discontinue steroids  Foreign body of small intestine 17 mm object noted on CT abdomen/pelvis -General surgery recommendations  Hypothyroidism -switch to Synthroid PO  Atrial fibrillation with RVR Intraoperative. Returned to sinus rhythm.  Leukocytosis Secondary to demargination from steroids vs tumor burden. Afebrile. No evidence of infection. -discontinue steroids as above    DVT prophylaxis: Lovenox Code Status: DNR Family Communication: None at bedside Disposition Plan: Discharge pending diet advancement and general surgery/oncology recommendations   Consultants:   General surgery  Oncology  Interventional radiology  Gastroenterology  Palliative care medicine  Procedures:   Ex lap,  omentectomy, Ileocecectomy, Ileostomy  Antimicrobials:  None    Subjective: Mild abdominal pain. No nausea or vomiting. Tolerated diet yesterday.  Objective: Vitals:   09/26/17 1256 09/26/17 2048 09/27/17 0447 09/27/17 0451  BP: (!) 141/60 (!) 161/56  (!) 154/60  Pulse: 85 73  83  Resp: 18 20  20   Temp: 98.1 F (36.7 C) 98.9 F (37.2 C)  99.3 F (37.4 C)  TempSrc: Oral Oral  Oral  SpO2: 94% 94%  91%  Weight:   86 kg (189 lb 9.5 oz)   Height:        Intake/Output Summary (Last 24 hours) at 09/27/2017 1048 Last data filed at 09/27/2017 0848 Gross per 24 hour  Intake 773 ml  Output 431 ml  Net 342 ml   Filed Weights   09/23/17 1852 09/25/17 0645 09/27/17 0447  Weight: 82.7 kg (182 lb 4.8 oz) 85 kg (187 lb 6.3 oz) 86 kg (189 lb 9.5 oz)    Examination:  General exam: Appears calm and comfortable Respiratory system: Clear to auscultation. Respiratory effort normal. Cardiovascular system: S1 & S2 heard, RRR. No murmurs, rubs, gallops or clicks. Gastrointestinal system: Abdomen is distendedd, soft and mildly tender. Normal bowel sounds heard. Ostomy with brown liquid stool Central nervous system: Alert and oriented. No focal neurological deficits. Extremities: No edema. No calf tenderness Skin: No cyanosis. No rashes Psychiatry: Judgement and insight appear normal. Mood & affect appropriate.   Exam unchanged from 09/26/17    Data Reviewed: I have personally reviewed following labs and imaging studies  CBC: Recent Labs  Lab 09/22/17 0452 09/23/17 0452 09/24/17 0431 09/25/17 0633 09/26/17 0501 09/27/17 0419  WBC 10.6* 13.6* 12.2* 21.9* 21.2* 21.3*  NEUTROABS 7.4  --  8.4*  --   --   --   HGB 10.1* 10.2* 10.2* 10.5* 10.0* 9.2*  HCT 31.9* 31.9* 32.4* 32.8* 31.7* 29.1*  MCV 88.9 87.9  88.5 89.9 88.5 87.4  PLT 181 176 152 133* 125* 277*   Basic Metabolic Panel: Recent Labs  Lab 09/22/17 0452 09/23/17 0452 09/24/17 0431 09/24/17 1613 09/25/17 0633  09/26/17 0501 09/27/17 0419  NA 138 138 137 134* 134* 134* 132*  K 3.9 4.1 4.2 3.8 4.5 5.0 4.8  CL 106 105 103 101 102 100* 97*  CO2 26 28 28 25 27 29 30   GLUCOSE 139* 133* 118* 242* 129* 140* 123*  BUN 19 24* 29* 28* 37* 33* 28*  CREATININE 1.01* 0.90 0.81 0.86 0.99 1.02* 0.90  CALCIUM 8.1* 8.0* 8.1* 7.4* 7.7* 7.9* 8.0*  MG 1.3* 2.0 1.8 1.5*  --  2.0 1.6*  PHOS 3.2  --  2.7 2.3*  --  3.3 3.3   GFR: Estimated Creatinine Clearance: 47.4 mL/min (by C-G formula based on SCr of 0.9 mg/dL). Liver Function Tests: Recent Labs  Lab 09/21/17 0353 09/22/17 0452 09/24/17 0431 09/27/17 0419  AST 16 15 15 16   ALT 11* 11* 10* 12*  ALKPHOS 45 45 45 55  BILITOT 0.5 0.4 0.5 0.5  PROT 4.9* 5.0* 5.0* 4.8*  ALBUMIN 2.5* 2.5* 2.6* 2.2*   No results for input(s): LIPASE, AMYLASE in the last 168 hours. No results for input(s): AMMONIA in the last 168 hours. Coagulation Profile: No results for input(s): INR, PROTIME in the last 168 hours. Cardiac Enzymes: No results for input(s): CKTOTAL, CKMB, CKMBINDEX, TROPONINI in the last 168 hours. BNP (last 3 results) No results for input(s): PROBNP in the last 8760 hours. HbA1C: No results for input(s): HGBA1C in the last 72 hours. CBG: Recent Labs  Lab 09/26/17 0541 09/26/17 1210 09/26/17 1808 09/26/17 2358 09/27/17 0554  GLUCAP 144* 139* 172* 144* 136*   Lipid Profile: No results for input(s): CHOL, HDL, LDLCALC, TRIG, CHOLHDL, LDLDIRECT in the last 72 hours. Thyroid Function Tests: Recent Labs    09/25/17 0633  TSH 1.038   Anemia Panel: No results for input(s): VITAMINB12, FOLATE, FERRITIN, TIBC, IRON, RETICCTPCT in the last 72 hours. Sepsis Labs: No results for input(s): PROCALCITON, LATICACIDVEN in the last 168 hours.  Recent Results (from the past 240 hour(s))  Surgical pcr screen     Status: None   Collection Time: 09/23/17 10:26 PM  Result Value Ref Range Status   MRSA, PCR NEGATIVE NEGATIVE Final   Staphylococcus aureus  NEGATIVE NEGATIVE Final    Comment: (NOTE) The Xpert SA Assay (FDA approved for NASAL specimens in patients 60 years of age and older), is one component of a comprehensive surveillance program. It is not intended to diagnose infection nor to guide or monitor treatment.          Radiology Studies: No results found.      Scheduled Meds: . enoxaparin (LOVENOX) injection  40 mg Subcutaneous Q24H  . feeding supplement (ENSURE ENLIVE)  237 mL Oral BID BM  . insulin aspart  0-15 Units Subcutaneous Q6H  . levothyroxine  50 mcg Intravenous Daily  . mouth rinse  15 mL Mouth Rinse BID  . metoprolol tartrate  2.5 mg Intravenous Q6H  . pantoprazole (PROTONIX) IV  40 mg Intravenous Q24H   Continuous Infusions: . diltiazem (CARDIZEM) infusion Stopped (09/25/17 0330)  . magnesium sulfate 1 - 4 g bolus IVPB    . TPN ADULT (ION) 65 mL/hr at 09/26/17 1710  . TPN ADULT (ION)       LOS: 11 days     Cordelia Poche, MD [facility] 09/27/2017, 10:48 AM Pager: [phone]  If 7PM-7AM, please contact night-coverage [email or web address] Password [email or web address] 09/27/2017, 10:48 AM

## 2017-09-27 NOTE — Progress Notes (Addendum)
Patient seen both yesterday and today and we discussed her pathology and her case discussed with the surgical team and she is doing well from a postop standpoint and we answered all of her questions and please let us know if we can be of any further assistance with this hospital stay and I am happy to see him back in the office and assist with her care in any way I can and thanks to the surgical team for their excellent care and work and her case was discussed with her son as well and I have recommend them to restart her Pentasa at home in an effort to hopefully delay any recurrence of Crohn's

## 2017-09-27 NOTE — Progress Notes (Signed)
3 Days Post-Op    CC:  SBO  Subjective: She looks pretty good, only complaint is of pain from the surgery.  She is not wanting any Vicodin, but is comfortable with Tylenol #3.  Good output thru the ileostomy this AM.  Wound vac in place for 1 week  Objective: Vital signs in last 24 hours: Temp:  [98.1 F (36.7 C)-99.3 F (37.4 C)] 99.3 F (37.4 C) (11/15 0451) Pulse Rate:  [73-85] 83 (11/15 0451) Resp:  [18-20] 20 (11/15 0451) BP: (141-161)/(56-60) 154/60 (11/15 0451) SpO2:  [91 %-94 %] 91 % (11/15 0451) Weight:  [86 kg (189 lb 9.5 oz)] 86 kg (189 lb 9.5 oz) (11/15 0447) Last BM Date: 09/26/17 360 PO 413 IV recorded? Afebrile, VSS Na 132, mag low WBC still 21.3K   Intake/Output from previous day: 11/14 0701 - 11/15 0700 In: 773 [P.O.:360; I.V.:413] Out: 331 [Drains:1; Stool:330] Intake/Output this shift: No intake/output data recorded.  General appearance: alert, cooperative and no distress Resp: clear to auscultation bilaterally and few rales in base GI: soft, incisional vac covering the wound, ileostomy is putting out allot of fluid this AM.  Lab Results:  Recent Labs    09/26/17 0501 09/27/17 0419  WBC 21.2* 21.3*  HGB 10.0* 9.2*  HCT 31.7* 29.1*  PLT 125* 131*    BMET Recent Labs    09/26/17 0501 09/27/17 0419  NA 134* 132*  K 5.0 4.8  CL 100* 97*  CO2 29 30  GLUCOSE 140* 123*  BUN 33* 28*  CREATININE 1.02* 0.90  CALCIUM 7.9* 8.0*   PT/INR No results for input(s): LABPROT, INR in the last 72 hours.  Recent Labs  Lab 09/21/17 0353 09/22/17 0452 09/24/17 0431 09/27/17 0419  AST 16 15 15 16   ALT 11* 11* 10* 12*  ALKPHOS 45 45 45 55  BILITOT 0.5 0.4 0.5 0.5  PROT 4.9* 5.0* 5.0* 4.8*  ALBUMIN 2.5* 2.5* 2.6* 2.2*     Lipase     Component Value Date/Time   LIPASE 17 09/16/2017 1507     Medications: . enoxaparin (LOVENOX) injection  40 mg Subcutaneous Q24H  . insulin aspart  0-15 Units Subcutaneous Q6H  . levothyroxine  50 mcg  Intravenous Daily  . mouth rinse  15 mL Mouth Rinse BID  . methylPREDNISolone (SOLU-MEDROL) injection  40 mg Intravenous Daily  . metoprolol tartrate  2.5 mg Intravenous Q6H  . pantoprazole (PROTONIX) IV  40 mg Intravenous Q24H   . diltiazem (CARDIZEM) infusion Stopped (09/25/17 0330)  . TPN ADULT (ION) 65 mL/hr at 09/26/17 1710   Anti-infectives (From admission, onward)   Start     Dose/Rate Route Frequency Ordered Stop   09/24/17 1030  cefoTEtan in Dextrose 5% (CEFOTAN) 2-2.08 GM-% IVPB  Status:  Discontinued    Comments:  Hogue, Samantha   : cabinet override      09/24/17 1030 09/24/17 1036   09/24/17 1023  dextrose 5 % with cefOXitin (MEFOXIN) ADS Med  Status:  Discontinued    Comments:  Hogue, Samantha   : cabinet override      09/24/17 1023 09/24/17 1033   09/24/17 0800  cefoTEtan (CEFOTAN) 1 g in dextrose 5 % 50 mL IVPB     1 g 100 mL/hr over 30 Minutes Intravenous To Short Stay 09/23/17 2310 09/24/17 1149      Assessment/Plan Recurrent small bowel obstruction Presumed Crohn's disease with inflammation of the distal terminal ileum/ foreign object distal terminal ileum. Abnormal omental pattern on CT  Metastatic adenocarcinoma with peritoneal carcinomatosis - SB primary - pathology report discussed with patient.  Copy left at bedside for the family members S/P exploratory laparotomy/ ileocecectomy with end ileostomy   Malnutrition - Prealbumin 11.3:  PICC line/TNA starting 09/21/17  per Dr. Tana Coast => prealbumin up to 12.4 ? Fluid overload - CXR with PICC; History of GERD/duodenal ulcers - Protonix Mild Renal insufficiency - improving Reported mild aortic stenosis. Hypertension Hypothyroid - synthroid Remote history of rheumatic heart disease.  History of tobacco use History of TIA.   Fen: TNA/clear liquids ID:  Pre op only  DVT: SCD/Lovenox   Plan:  Full liquids, Tylenol #3 for PO pain meds.  Mobilize more.  I will let Medicine and pharmacy fix her Mag.  If she does  well with PO's today we can start weaning TNA tomorrow.        LOS: 11 days    Chantil Bari 09/27/2017 [phone]

## 2017-09-27 NOTE — Progress Notes (Signed)
Nutrition Follow-up  DOCUMENTATION CODES:   Not applicable  INTERVENTION:   -TPN management per pharmacy -Ensure Enlive po BID, each supplement provides 350 kcal and 20 grams of protein  NUTRITION DIAGNOSIS:   Inadequate oral intake related to altered GI function as evidenced by (TPN dependent).  Progressing  GOAL:   Patient will meet greater than or equal to 90% of their needs  Met with TPN  MONITOR:   PO intake, Supplement acceptance, Diet advancement, Labs, Weight trends, Skin, I & O's  REASON FOR ASSESSMENT:   Consult New TPN/TNA  ASSESSMENT:   Terri [name] is a 83 y.o. female with medical history significant of Crohn's disease diagnosed x5 years ago.  SBO in July this year felt to be secondary to Crohn's disease.  SBO resolved / improved after NGT and steroids though she has been having some abdominal symptoms in the intervening time.  Patient presents to the ED with c/o nausea, abd distention, concern for another SBO.  11/6- s/p omental mass biopsy with IR 11/8- per GI notes, KUB reveals persistent dilation of small bowel loops with gas in colon 11/9- PICC placed 11/12- Exploratory laparotomy, omentectomy, resection of ileum and cecum 11/14- NGT removed  Pathology revealed new diagnosis of metastatic adenocarcinoma located in the bowel, omentum and peritoneum. Palliative care following.   Pt remains TPN dependent. Plan to continue TPN @ 65 ml/hr, which provides 1679 kcals and 90 grams protein, meeting 100% of estimated needs. Pt advanced to full liquid diet today and will start to wean TPN tomorrow if tolerates diet.   Labs reviewed: Na: 132, Mg: 1.6, CBGS: 136-172 (inpatient orders for glycemic control are 0-15 units insulin aspart every 6 hours).   Diet Order:  TPN ADULT (ION) Diet full liquid Room service appropriate? Yes; Fluid consistency: Thin  EDUCATION NEEDS:   Education needs have been addressed  Skin:  Skin Assessment: Skin Integrity  Issues: Skin Integrity Issues:: Wound VAC Wound Vac: abdominal incision  Last BM:  09/27/17 (250 ml output via ileostomy)  Height:   Ht Readings from Last 1 Encounters:  09/16/17 _0  (1.575 m)    Weight:   Wt Readings from Last 1 Encounters:  09/27/17 189 lb 9.5 oz (86 kg)    Ideal Body Weight:  50 kg  BMI:  Body mass index is 34.68 kg/m.  Estimated Nutritional Needs:   Kcal:  1600-1800  Protein:  80-95 grams  Fluid:  1.6-1.8 L    Herberth Deharo A. Jimmye Norman, RD, LDN, CDE Pager: [phone] After hours Pager: [phone]

## 2017-09-27 NOTE — Care Management Note (Signed)
Case Management Note  Patient Details  Name: Terri [name] MRN: [id number] Date of Birth: 1931-04-18  Subjective/Objective:                    Action/Plan:  [place] to follow for Palliative Care Outpatient.   Expected Discharge Date:  09/21/17               Expected Discharge Plan:  [place]  In-House Referral:     Discharge planning Services  CM Consult  Post Acute Care Choice:  Home Health Choice offered to:  Patient  DME Arranged:    DME Agency:     HH Arranged:  RN [place] Agency:  [place]  Status of Service:  Completed, signed off  If discussed at [employer] of Stay Meetings, dates discussed:    Additional Comments:  Marilu Favre, RN 09/27/2017, 2:14 PM

## 2017-09-27 NOTE — Telephone Encounter (Signed)
Appt has been scheduled for the pt to see Dr. Benay Spice on 11/27 at 2pm. Letter mailed to the pt.

## 2017-09-28 LAB — CBC
HCT: 28.1 % — ABNORMAL LOW (ref 36.0–46.0)
Hemoglobin: 9 g/dL — ABNORMAL LOW (ref 12.0–15.0)
MCH: 28 pg (ref 26.0–34.0)
MCHC: 32 g/dL (ref 30.0–36.0)
MCV: 87.5 fL (ref 78.0–100.0)
PLATELETS: 137 10*3/uL — AB (ref 150–400)
RBC: 3.21 MIL/uL — AB (ref 3.87–5.11)
RDW: 13.6 % (ref 11.5–15.5)
WBC: 16.8 10*3/uL — ABNORMAL HIGH (ref 4.0–10.5)

## 2017-09-28 LAB — BASIC METABOLIC PANEL
ANION GAP: 7 (ref 5–15)
BUN: 20 mg/dL (ref 6–20)
CALCIUM: 8 mg/dL — AB (ref 8.9–10.3)
CHLORIDE: 93 mmol/L — AB (ref 101–111)
CO2: 29 mmol/L (ref 22–32)
Creatinine, Ser: 0.87 mg/dL (ref 0.44–1.00)
GFR calc Af Amer: >60 mL/min (ref >60)
GFR calc non Af Amer: 59 mL/min — ABNORMAL LOW (ref >60)
GLUCOSE: 124 mg/dL — AB (ref 65–99)
Potassium: 4.7 mmol/L (ref 3.5–5.1)
Sodium: 129 mmol/L — ABNORMAL LOW (ref 135–145)

## 2017-09-28 LAB — GLUCOSE, CAPILLARY
GLUCOSE-CAPILLARY: 99 mg/dL (ref 65–99)
Glucose-Capillary: 94 mg/dL (ref 65–99)

## 2017-09-28 MED ORDER — METOPROLOL TARTRATE 25 MG PO TABS
25.0000 mg | ORAL_TABLET | Freq: Two times a day (BID) | ORAL | Status: DC
  Administered 2017-09-28 – 2017-09-30 (×4): 25 mg via ORAL
  Filled 2017-09-28 (×5): qty 1

## 2017-09-28 MED ORDER — PREDNISONE 10 MG PO TABS
10.0000 mg | ORAL_TABLET | Freq: Every day | ORAL | Status: DC
  Administered 2017-09-28 – 2017-09-30 (×3): 10 mg via ORAL
  Filled 2017-09-28 (×3): qty 1

## 2017-09-28 MED ORDER — LOSARTAN POTASSIUM 50 MG PO TABS: 50.0000 mg | ORAL_TABLET | Freq: Every day | ORAL | Status: DC

## 2017-09-28 MED ORDER — PANTOPRAZOLE SODIUM 40 MG PO TBEC
40.0000 mg | DELAYED_RELEASE_TABLET | Freq: Every day | ORAL | Status: DC
  Administered 2017-09-28 – 2017-09-30 (×3): 40 mg via ORAL
  Filled 2017-09-28 (×3): qty 1

## 2017-09-28 MED ORDER — PROMETHAZINE HCL 25 MG PO TABS: 12.5000 mg | ORAL_TABLET | Freq: Three times a day (TID) | ORAL | Status: DC | PRN

## 2017-09-28 NOTE — Progress Notes (Signed)
PROGRESS NOTE    Terri [name]  KYH:[id number] DOB: 1931/03/03 DOA: 09/16/2017 PCP: Prince Solian, MD   Brief Narrative: Terri [name] is a 83 y.o. female with a history of Crohn's disease. She presented with recurrent SBO and found to have metastatic cancer with carcinomatosis.   Assessment & Plan:   Principal Problem:   SBO (small bowel obstruction) (HCC) Active Problems:   Crohn's disease ([place])   Crohn's ileitis, with intestinal obstruction ([place])   Foreign body of small intestine, subsequent encounter   Abnormal CT of the abdomen   Abdominal carcinomatosis [facility])   Palliative care encounter   Goals of care, counseling/discussion   Small bowel obstruction Recurrent. Secondary to metastatic adenocarcinoma with carcinomatosis. S/p ex lap/ileocecectomy and end ileostomy. -General surgery recommendations: TNA discontinued. Advancing diet  Hypomagnesemia Given magnesium yesterday. -magnesium level pending  Omental nodularity Metastatic adenocarcinoma with peritoneal carcinomatosis Biopsy returned with confirmed invasive metastatic adenocarcinoma -oncology recommendations: outpatient follow-up  Crohn's disease No flair. Stable. -discontinue IV steroids -continue home prednisone, although patient states that she was previously on a taper with goals of stopping  Foreign body of small intestine 17 mm object noted on CT abdomen/pelvis -General surgery recommendations  Hypothyroidism -Continue Synthroid   Atrial fibrillation with RVR Intraoperative. Returned to sinus rhythm.  Leukocytosis Secondary to demargination from steroids vs tumor burden. Afebrile. No evidence of infection. Trending down since stopping IV steroids.  Thrombocytopenia Stable.    DVT prophylaxis: Lovenox Code Status: DNR Family Communication: None at bedside Disposition Plan: Discharge pending diet advancement and general surgery/oncology recommendations   Consultants:    General surgery  Oncology  Interventional radiology  Gastroenterology  Palliative care medicine  Procedures:   Ex lap, omentectomy, Ileocecectomy, Ileostomy  Antimicrobials:  None    Subjective: Tolerating diet. Abdominal pain this morning that improved with time and movement.  Objective: Vitals:   09/27/17 1851 09/27/17 2105 09/28/17 0027 09/28/17 0458  BP: (!) 121/51 (!) 131/48 (!) 132/54 125/61  Pulse: 73 74 63 70  Resp:  20  18  Temp:  98 F (36.7 C)  98.6 F (37 C)  TempSrc:  Oral  Oral  SpO2:  94%  95%  Weight:    83.2 kg (183 lb 6.8 oz)  Height:        Intake/Output Summary (Last 24 hours) at 09/28/2017 0909 Last data filed at 09/28/2017 0502 Gross per 24 hour  Intake 1001.17 ml  Output 1450 ml  Net -448.83 ml   Filed Weights   09/25/17 0645 09/27/17 0447 09/28/17 0458  Weight: 85 kg (187 lb 6.3 oz) 86 kg (189 lb 9.5 oz) 83.2 kg (183 lb 6.8 oz)    Examination:  General exam: Appears calm and comfortable Respiratory system: Clear to auscultation. Respiratory effort normal. Cardiovascular system: S1 & S2 heard, RRR. 2/6 systolic murmur Gastrointestinal system: Soft, mildly tender, non-distended, no guarding, no rebound, no masses felt, ostomy bag with formed stool Central nervous system: Alert and oriented. No focal neurological deficits. Extremities: No edema. No calf tenderness Skin: No cyanosis. No rashes Psychiatry: Judgement and insight appear normal. Mood & affect appropriate.    Data Reviewed: I have personally reviewed following labs and imaging studies  CBC: Recent Labs  Lab 09/22/17 0452  09/24/17 0431 09/25/17 0633 09/26/17 0501 09/27/17 0419 09/28/17 0454  WBC 10.6*   < > 12.2* 21.9* 21.2* 21.3* 16.8*  NEUTROABS 7.4  --  8.4*  --   --   --   --   HGB 10.1*   < >  10.2* 10.5* 10.0* 9.2* 9.0*  HCT 31.9*   < > 32.4* 32.8* 31.7* 29.1* 28.1*  MCV 88.9   < > 88.5 89.9 88.5 87.4 87.5  PLT 181   < > 152 133* 125* 131* 137*   <  > = values in this interval not displayed.   Basic Metabolic Panel: Recent Labs  Lab 09/22/17 0452 09/23/17 0452 09/24/17 0431 09/24/17 1613 09/25/17 0633 09/26/17 0501 09/27/17 0419  NA 138 138 137 134* 134* 134* 132*  K 3.9 4.1 4.2 3.8 4.5 5.0 4.8  CL 106 105 103 101 102 100* 97*  CO2 26 28 28 25 27 29 30   GLUCOSE 139* 133* 118* 242* 129* 140* 123*  BUN 19 24* 29* 28* 37* 33* 28*  CREATININE 1.01* 0.90 0.81 0.86 0.99 1.02* 0.90  CALCIUM 8.1* 8.0* 8.1* 7.4* 7.7* 7.9* 8.0*  MG 1.3* 2.0 1.8 1.5*  --  2.0 1.6*  PHOS 3.2  --  2.7 2.3*  --  3.3 3.3   GFR: Estimated Creatinine Clearance: 46.5 mL/min (by C-G formula based on SCr of 0.9 mg/dL). Liver Function Tests: Recent Labs  Lab 09/22/17 0452 09/24/17 0431 09/27/17 0419  AST 15 15 16   ALT 11* 10* 12*  ALKPHOS 45 45 55  BILITOT 0.4 0.5 0.5  PROT 5.0* 5.0* 4.8*  ALBUMIN 2.5* 2.6* 2.2*   No results for input(s): LIPASE, AMYLASE in the last 168 hours. No results for input(s): AMMONIA in the last 168 hours. Coagulation Profile: No results for input(s): INR, PROTIME in the last 168 hours. Cardiac Enzymes: No results for input(s): CKTOTAL, CKMB, CKMBINDEX, TROPONINI in the last 168 hours. BNP (last 3 results) No results for input(s): PROBNP in the last 8760 hours. HbA1C: No results for input(s): HGBA1C in the last 72 hours. CBG: Recent Labs  Lab 09/27/17 0554 09/27/17 1208 09/27/17 1719 09/28/17 0025 09/28/17 0536  GLUCAP 136* 143* 211* 99 94   Lipid Profile: No results for input(s): CHOL, HDL, LDLCALC, TRIG, CHOLHDL, LDLDIRECT in the last 72 hours. Thyroid Function Tests: No results for input(s): TSH, T4TOTAL, FREET4, T3FREE, THYROIDAB in the last 72 hours. Anemia Panel: No results for input(s): VITAMINB12, FOLATE, FERRITIN, TIBC, IRON, RETICCTPCT in the last 72 hours. Sepsis Labs: No results for input(s): PROCALCITON, LATICACIDVEN in the last 168 hours.  Recent Results (from the past 240 hour(s))  Surgical  pcr screen     Status: None   Collection Time: 09/23/17 10:26 PM  Result Value Ref Range Status   MRSA, PCR NEGATIVE NEGATIVE Final   Staphylococcus aureus NEGATIVE NEGATIVE Final    Comment: (NOTE) The Xpert SA Assay (FDA approved for NASAL specimens in patients 23 years of age and older), is one component of a comprehensive surveillance program. It is not intended to diagnose infection nor to guide or monitor treatment.          Radiology Studies: No results found.      Scheduled Meds: . enoxaparin (LOVENOX) injection  40 mg Subcutaneous Q24H  . feeding supplement (ENSURE ENLIVE)  237 mL Oral BID BM  . levothyroxine  100 mcg Oral QAC breakfast  . mouth rinse  15 mL Mouth Rinse BID  . metoprolol tartrate  25 mg Oral BID  . pantoprazole (PROTONIX) IV  40 mg Intravenous Q24H  . predniSONE  10 mg Oral Q breakfast   Continuous Infusions:    LOS: 12 days     Cordelia Poche, MD [facility] 09/28/2017, 9:08 AM Pager: [phone]  If 7PM-7AM, please contact night-coverage [email or web address] Password [email or web address] 09/28/2017, 9:08 AM

## 2017-09-28 NOTE — Consult Note (Addendum)
[place] Nurse ostomy follow up Surgical team was in this am to assess patient and remove incisional vac. Stoma type/location:  Ileostomy surgery performed on 11/12 Stomal assessment/size: Stoma is red and viable, flush with skin level, 1 inch Peristomal assessment: Intact skin surrounding Output: Small amt liquid brown stool  Ostomy pouching: 1pc.  Education provided:  Demonstrated pouch change to daughters at the bedside using one piece pouch and barrier ring to attempt to maintain a seal.  Patient states she is legally blind and will not be able to see to apply the pouch in the correct location.  Family members participated in the steps of application and using barrier ring, and pt was able to open and close the velcro to empty without assistance. Educational materials in the room and 4 sets of pouches and barrier rings are at the bedside for use at home.  Discussed  dietary precautions and importance of avoiding dehydration. Pt plans to have home health assistance.  Reviewed pouching routines and ordering supplies. Enrolled patient in [place] Start Discharge program: Yes Julien Girt MSN, RN, [place], [place], [place]

## 2017-09-28 NOTE — Evaluation (Signed)
Physical Therapy Evaluation Patient Details Name: Terri [name] MRN: [id number] DOB: 1930/11/22 Today's Date: 09/28/2017   History of Present Illness  83 y.o. female admitted on 09/16/17 for nausea, abdominal distention, and concern for SBO.  Pt underwent bx of mass found on CT on 09/19/17 (at [facility] as she was already scheduled to be there as an OP), pt s/p Exploratory laparotomy, omentectomy, resection of ileum and cecum on 09/24/17 with post op ostomy bag on the right.  Found to have metastatic adenocarcinoma with carcinomatosis.  Pt with significant PMH of TIA, HTN, HOH, Chron's disease, aortic stenosis/insufficiency, basal cell carinoma removal, per pt report she is legally blind (so she doesn't drive).   Clinical Impression  Pt would benefit from home health assessment due to she is normally independent and is now safer using a RW.  She has gait instability and weakness from prolonged hospitalization and bowel surgery.  She will likely recover quickly, but HH therapy could help with progression back to cane or no AD at home.   PT to follow acutely for deficits listed below.       Follow Up Recommendations Home health PT;Supervision for mobility/OOB    Equipment Recommendations  None recommended by PT    Recommendations for Other Services   NA    Precautions / Restrictions Precautions Precautions: Fall;Other (comment)(abdominal) Precaution Comments: check ostomy before mobility to make sure it is not too full of fecal matter or gas.        Mobility  Bed Mobility               General bed mobility comments: Pt was OOB in chair.  We verally discussed log roll for more comfortable in/out of bed mobility.   Transfers Overall transfer level: Needs assistance   Transfers: Sit to/from Stand Sit to Stand: Min guard         General transfer comment: Min guard assist for safety during transitions and balance.   Ambulation/Gait Ambulation/Gait assistance: Min assist    Assistive device: 1 person hand held assist Gait Pattern/deviations: Step-through pattern;Staggering left;Staggering right Gait velocity: decreased Gait velocity interpretation: Below normal speed for age/gender General Gait Details: Pt was able to preform hallway ambulation with min hand held assist for balance.  She would likely do better at this time with RW use and may get up to supervision level with RW, but this is not her baseline.           Balance Overall balance assessment: Needs assistance Sitting-balance support: Feet supported;No upper extremity supported Sitting balance-Leahy Scale: Good     Standing balance support: Single extremity supported Standing balance-Leahy Scale: Fair                               Pertinent Vitals/Pain Pain Assessment: Faces Faces Pain Scale: Hurts even more Pain Location: abdomen Pain Descriptors / Indicators: Sore Pain Intervention(s): Limited activity within patient's tolerance;Monitored during session;Repositioned    Home Living Family/patient expects to be discharged to:: Private residence Living Arrangements: Children Available Help at Discharge: Family Type of Home: House Home Access: Stairs to enter Entrance Stairs-Rails: Right Entrance Stairs-Number of Steps: 4 Home Layout: One level Home Equipment: Environmental consultant - 4 wheels;Cane - single point;Bedside commode Additional Comments: Pt is a retired Naval architect Level of Independence: Independent         Comments: Pt is generally independent with her mobility.  She does have access  to a RW and a cane     Hand Dominance   Dominant Hand: Right    Extremity/Trunk Assessment   Upper Extremity Assessment Upper Extremity Assessment: Overall WFL for tasks assessed    Lower Extremity Assessment Lower Extremity Assessment: Generalized weakness(at least 4/5 per gross assessment in recliner chair. )    Cervical / Trunk Assessment Cervical / Trunk  Assessment: Normal  Communication   Communication: No difficulties  Cognition Arousal/Alertness: Awake/alert Behavior During Therapy: WFL for tasks assessed/performed Overall Cognitive Status: Within Functional Limits for tasks assessed                                               Assessment/Plan    PT Assessment Patient needs continued PT services  PT Problem List Decreased strength;Decreased activity tolerance;Decreased balance;Decreased mobility;Decreased knowledge of use of DME;Decreased knowledge of precautions;Pain       PT Treatment Interventions DME instruction;Gait training;Stair training;Functional mobility training;Therapeutic exercise;Therapeutic activities;Balance training;Patient/family education    PT Goals (Current goals can be found in the Care Plan section)  Acute Rehab PT Goals Patient Stated Goal: to get home and get better, be safe PT Goal Formulation: With patient Time For Goal Achievement: 10/12/17 Potential to Achieve Goals: Good    Frequency Min 3X/week           AM-PAC PT "6 Clicks" Daily Activity  Outcome Measure Difficulty turning over in bed (including adjusting bedclothes, sheets and blankets)?: Unable Difficulty moving from lying on back to sitting on the side of the bed? : Unable Difficulty sitting down on and standing up from a chair with arms (e.g., wheelchair, bedside commode, etc,.)?: Unable Help needed moving to and from a bed to chair (including a wheelchair)?: A Little Help needed walking in hospital room?: A Little Help needed climbing 3-5 steps with a railing? : A Little 6 Click Score: 12    End of Session   Activity Tolerance: Patient limited by pain Patient left: in chair;with call bell/phone within reach   PT Visit Diagnosis: Muscle weakness (generalized) (M62.81);Difficulty in walking, not elsewhere classified (R26.2);Pain Pain - Right/Left: (midline) Pain - part of body: (abdomen)    Time:  7622-6333 PT Time Calculation (min) (ACUTE ONLY): 23 min   Charges:         Wells Guiles B. Haruko Mersch, PT, DPT [phone]   PT Evaluation $PT Eval Moderate Complexity: 1 Mod PT Treatments $Gait Training: 8-22 mins   09/28/2017, 1:51 PM

## 2017-09-28 NOTE — Care Management Important Message (Signed)
Important Message  Patient Details  Name: Terri [name] MRN: [id number] Date of Birth: [birth year]   Medicare Important Message Given:  Yes    Nathen May 09/28/2017, 12:45 PM

## 2017-09-28 NOTE — Progress Notes (Signed)
4 Days Post-Op    CC: SBO  Subjective: Patient is sitting up and looks good this morning.  She reports tolerating the full liquids well.  Says she was up walking yesterday.  She reports the Tylenol 3 is working well for her. Objective: Vital signs in last 24 hours: Temp:  [97.9 F (36.6 C)-98.6 F (37 C)] 98.6 F (37 C) (11/16 0458) Pulse Rate:  [63-74] 70 (11/16 0458) Resp:  [18-20] 18 (11/16 0458) BP: (121-133)/(48-61) 125/61 (11/16 0458) SpO2:  [94 %-95 %] 95 % (11/16 0458) Weight:  [83.2 kg (183 lb 6.8 oz)] 83.2 kg (183 lb 6.8 oz) (11/16 0458) Last BM Date: 09/27/17(via ileostomy) PO not recorded 1000 IV Voided x 5 1700 from ileostomy Wt down, but varies 2-3 KG last 48 hours Afebrile, VSS WBC is trending down Intake/Output from previous day: 11/15 0701 - 11/16 0700 In: 1001.2 [I.V.:951.2; IV Piggyback:50] Out: 1700 [Stool:1700] Intake/Output this shift: No intake/output data recorded.  General appearance: alert, cooperative and no distress Resp: clear to auscultation bilaterally GI: Soft, sore, ileostomy is working well.  We will plan to take the incisional VAC off today but will have to do it with the ostomy bag change.  Since the ostomy bag is incorporated into the wound VAC.  Lab Results:  Recent Labs    09/27/17 0419 09/28/17 0454  WBC 21.3* 16.8*  HGB 9.2* 9.0*  HCT 29.1* 28.1*  PLT 131* 137*    BMET Recent Labs    09/26/17 0501 09/27/17 0419  NA 134* 132*  K 5.0 4.8  CL 100* 97*  CO2 29 30  GLUCOSE 140* 123*  BUN 33* 28*  CREATININE 1.02* 0.90  CALCIUM 7.9* 8.0*   PT/INR No results for input(s): LABPROT, INR in the last 72 hours.  Recent Labs  Lab 09/22/17 0452 09/24/17 0431 09/27/17 0419  AST 15 15 16   ALT 11* 10* 12*  ALKPHOS 45 45 55  BILITOT 0.4 0.5 0.5  PROT 5.0* 5.0* 4.8*  ALBUMIN 2.5* 2.6* 2.2*     Lipase     Component Value Date/Time   LIPASE 17 09/16/2017 1507     Medications: . enoxaparin (LOVENOX) injection  40  mg Subcutaneous Q24H  . feeding supplement (ENSURE ENLIVE)  237 mL Oral BID BM  . insulin aspart  0-15 Units Subcutaneous Q6H  . levothyroxine  100 mcg Oral QAC breakfast  . mouth rinse  15 mL Mouth Rinse BID  . metoprolol tartrate  2.5 mg Intravenous Q6H  . pantoprazole (PROTONIX) IV  40 mg Intravenous Q24H    Assessment/Plan Recurrent small bowel obstruction/presumedCrohn's disease with inflammation of the distal terminal ileum/foreign object distal terminal ileum. Metastatic adenocarcinoma with peritoneal carcinomatosis - SB primary - pathology report discussed with patient. Copy left at bedside for the family members S/P exploratory laparotomy/ ileocecectomy with end ileostomy, 09/24/17, Dr. Donnie Mesa  Malnutrition - Prealbumin 11.3: PICC line/TNA starting11/9/18per Dr. Tana Coast  Fluid overload -  History of GERD/duodenal ulcers- Protonix Mild Renal insufficiency- improving Reported mild aortic stenosis. Hypertension Hypothyroid-synthroid Remote history of rheumatic heart disease.  History of tobacco use History of TIA. Legally blind -cannot see to do ostomy care alone. Fen:TNA/full liquids ID:  Pre op only  DVT: SCD/Lovenox  Plan: Advance her diet.  Vascular OT and PT to see her.  She will need home health care.  Her TNA has been discontinued.  We need to make sure she becoming dehydrated with her ileostomy output.  LOS: 12 days    Shakeela Rabadan 09/28/2017 [phone]

## 2017-09-28 NOTE — Plan of Care (Signed)
Nutrition Education Note  RD consulted for nutrition education regarding nutrition management for diverticulosis/ Diverticulitis.    RD provided "Ileostomy Nutrition Therapy" handout from the [employer]. Reviewed patient's dietary recall and discussed ways for pt to meet nutrition goals over the next several weeks. Explained reasons for pt to follow a low fiber diet. Reviewed low fiber foods and high fiber foods. Discussed ways to increased fluid intake to prevent dehydration, as well as signs of symptoms of dehydration. Encouraged use of oral rehydration supplements as needed  Teach back method used. Pt verbalizes understanding of information provided.   Expect fair to good compliance. Pt with family members who are experienced in caring for ostomies; handuts provided with RD contact information and encouraged pt to call with additional questions if needed.   RD already following for acute nutrition issues; will continue to follow and adjust nutrition plan as necessary.   Iyad Deroo A. Jimmye Norman, RD, LDN, CDE Pager: [phone] After hours Pager: [phone]

## 2017-09-28 NOTE — Progress Notes (Signed)
Nutrition Follow-up  DOCUMENTATION CODES:   Not applicable  INTERVENTION:   -Continue Ensure Enlive po BID, each supplement provides 350 kcal and 20 grams of protein -Chewable MVI daily -Provided education on ways to increase calories and protein, as well as low fiber diet with adequate fluid intake; see education note for further details  NUTRITION DIAGNOSIS:   Inadequate oral intake related to early satiety as evidenced by meal completion < 50%.  Ongoing  GOAL:   Patient will meet greater than or equal to 90% of their needs  Progressing  MONITOR:   PO intake, Diet advancement, Labs, Skin, I & O's  REASON FOR ASSESSMENT:   Consult Assessment of nutrition requirement/status  ASSESSMENT:   Terri [name] is a 83 y.o. female with medical history significant of Crohn's disease diagnosed x5 years ago.  SBO in July this year felt to be secondary to Crohn's disease.  SBO resolved / improved after NGT and steroids though she has been having some abdominal symptoms in the intervening time.  Patient presents to the ED with c/o nausea, abd distention, concern for another SBO.  11/6- s/p omental mass biopsy with IR 11/8- per GI notes, KUB reveals persistent dilation of small bowel loops with gas in colon 11/9- PICC placed 11/12-Exploratory laparotomy, omentectomy, resection of ileum and cecum 11/14- NGT removed 11/16- TPN d/c  Spoke with pt, who reports she is feeling better each day. She did not eat much breakfast this morning, due to it getting cold due to multiple cares/procedures. She reports tolerating liquids well and consuming Ensure supplements BID. She is eager to try solid foods, but hesitant to eat oatmeal, as she has had poor experiences with tolerance in the past. Advised pt to try other hot cereals, like grits or cream of wheat.   Pt shares that she is likely to go home either tomorrow or Sunday. She verbalizes that she will have both home health and palliative  care support. She also has multiple family members who will be with her next week (daughter from [place], as well as son and daughter-in-law, who is a pediatrician and pediatric nurse respectively). She reports that she feels comfortable taking care of herself with her family support (daughter-in-law works routinely with ostomies).   Discussed importance of good meal and supplement intake to promote healing. Also encouraged supplements and adequate fluid intake; reviewed symptoms of dehydration. See education note for further details.   Labs reviewed: CBGS" [id number].  Diet Order:  DIET SOFT Room service appropriate? Yes; Fluid consistency: Thin  EDUCATION NEEDS:   Education needs have been addressed  Skin:  Skin Assessment: Skin Integrity Issues: Skin Integrity Issues:: Wound VAC Wound Vac: abdominal incision  Last BM:  09/28/17 (250 ml output via ileostomy)  Height:   Ht Readings from Last 1 Encounters:  09/16/17 5\' 2"  (1.575 m)    Weight:   Wt Readings from Last 1 Encounters:  09/28/17 183 lb 6.8 oz (83.2 kg)    Ideal Body Weight:  50 kg  BMI:  Body mass index is 33.55 kg/m.  Estimated Nutritional Needs:   Kcal:  1600-1800  Protein:  80-95 grams  Fluid:  1.6-1.8 L    Anela Bensman A. Jimmye Norman, RD, LDN, CDE Pager: [phone] After hours Pager: [phone]

## 2017-09-29 ENCOUNTER — Inpatient Hospital Stay (HOSPITAL_COMMUNITY): Payer: Medicare Other

## 2017-09-29 LAB — BASIC METABOLIC PANEL
Anion gap: 7 (ref 5–15)
BUN: 20 mg/dL (ref 6–20)
CALCIUM: 7.8 mg/dL — AB (ref 8.9–10.3)
CO2: 31 mmol/L (ref 22–32)
CREATININE: 0.94 mg/dL (ref 0.44–1.00)
Chloride: 92 mmol/L — ABNORMAL LOW (ref 101–111)
GFR calc Af Amer: >60 mL/min (ref >60)
GFR calc non Af Amer: 54 mL/min — ABNORMAL LOW (ref >60)
GLUCOSE: 88 mg/dL (ref 65–99)
Potassium: 4.5 mmol/L (ref 3.5–5.1)
Sodium: 130 mmol/L — ABNORMAL LOW (ref 135–145)

## 2017-09-29 LAB — MAGNESIUM: Magnesium: 1.4 mg/dL — ABNORMAL LOW (ref 1.7–2.4)

## 2017-09-29 LAB — PHOSPHORUS: Phosphorus: 3.2 mg/dL (ref 2.5–4.6)

## 2017-09-29 MED ORDER — OXYCODONE-ACETAMINOPHEN 5-325 MG PO TABS
1.0000 | ORAL_TABLET | ORAL | Status: DC | PRN
  Administered 2017-09-29 – 2017-09-30 (×2): 1 via ORAL
  Filled 2017-09-29 (×2): qty 1

## 2017-09-29 MED ORDER — MAGNESIUM SULFATE 2 GM/50ML IV SOLN
2.0000 g | Freq: Once | INTRAVENOUS | Status: AC
  Administered 2017-09-29: 2 g via INTRAVENOUS
  Filled 2017-09-29: qty 50

## 2017-09-29 NOTE — Progress Notes (Signed)
PROGRESS NOTE    Terri [name]  UDJ:[id number] DOB: Jul 21, 1931 DOA: 09/16/2017 PCP: Prince Solian, MD   Brief Narrative: Terri [name] is a 83 y.o. female with a history of Crohn's disease. She presented with recurrent SBO and found to have metastatic cancer with carcinomatosis.   Assessment & Plan:   Principal Problem:   SBO (small bowel obstruction) (HCC) Active Problems:   Crohn's disease ([place])   Crohn's ileitis, with intestinal obstruction ([place])   Foreign body of small intestine, subsequent encounter   Abnormal CT of the abdomen   Abdominal carcinomatosis [facility])   Palliative care encounter   Goals of care, counseling/discussion   Small bowel obstruction Recurrent. Secondary to metastatic adenocarcinoma with carcinomatosis. S/p ex lap/ileocecectomy and end ileostomy. Worsened abdominal pain. Ostomy does have output, however. -General surgery recommendations: Advancing diet -switch from Tylenol #3 to Percocet prn; watch stool while on higher doses of narcotics -repeat abdominal x-ray  Hypomagnesemia Labs ordered and canceled yesterday -repeat magnesium level today  Omental nodularity Metastatic adenocarcinoma with peritoneal carcinomatosis Biopsy returned with confirmed invasive metastatic adenocarcinoma -oncology recommendations: outpatient follow-up  Crohn's disease No flair. Stable. -Taper prednisone to discontinue  Foreign body of small intestine 17 mm object noted on CT abdomen/pelvis -General surgery recommendations  Hypothyroidism -Continue Synthroid   Atrial fibrillation with RVR Intraoperative. Returned to sinus rhythm.  Leukocytosis Secondary to demargination from steroids vs tumor burden. Afebrile. No evidence of infection. Trending down since stopping IV steroids. -repeat CBC in AM  Thrombocytopenia Stable.    DVT prophylaxis: Lovenox Code Status: DNR Family Communication: None at bedside Disposition Plan: Discharge pending  diet advancement and general surgery/oncology recommendations    Consultants:   General surgery  Oncology  Interventional radiology  Gastroenterology  Palliative care medicine  Procedures:   Ex lap, omentectomy, Ileocecectomy, Ileostomy  Antimicrobials:  None    Subjective: Abdominal pain worsened last night into this morning. Tylenol #3 not helping and required morphine IV. Passing gas. Output in ostomy.  Objective: Vitals:   09/28/17 0458 09/28/17 1441 09/28/17 2030 09/29/17 0500  BP: 125/61 120/60 (!) 139/48 (!) 116/48  Pulse: 70 70 74 71  Resp: 18 16 16 18   Temp: 98.6 F (37 C) 98.5 F (36.9 C) 98.5 F (36.9 C) 98.3 F (36.8 C)  TempSrc: Oral Oral Oral Oral  SpO2: 95% 97% 97% 95%  Weight: 83.2 kg (183 lb 6.8 oz)   80.2 kg (176 lb 12.9 oz)  Height:        Intake/Output Summary (Last 24 hours) at 09/29/2017 1221 Last data filed at 09/29/2017 1201 Gross per 24 hour  Intake 280 ml  Output 1850 ml  Net -1570 ml   Filed Weights   09/27/17 0447 09/28/17 0458 09/29/17 0500  Weight: 86 kg (189 lb 9.5 oz) 83.2 kg (183 lb 6.8 oz) 80.2 kg (176 lb 12.9 oz)    Examination:  General exam: Appears calm and comfortable Respiratory system: Clear to auscultation. Respiratory effort normal. Cardiovascular system: S1 & S2 heard, RRR. 2/6 systolic murmur Gastrointestinal system: Soft, moderately tender, non-distended, mild guarding, no rebound Central nervous system: Alert and oriented. No focal neurological deficits. Extremities: No edema. No calf tenderness Skin: No cyanosis. No rashes Psychiatry: Judgement and insight appear normal. Mood & affect appropriate.    Data Reviewed: I have personally reviewed following labs and imaging studies  CBC: Recent Labs  Lab 09/24/17 0431 09/25/17 0633 09/26/17 0501 09/27/17 0419 09/28/17 0454  WBC 12.2* 21.9* 21.2* 21.3* 16.8*  NEUTROABS 8.4*  --   --   --   --  HGB 10.2* 10.5* 10.0* 9.2* 9.0*  HCT 32.4* 32.8*  31.7* 29.1* 28.1*  MCV 88.5 89.9 88.5 87.4 87.5  PLT 152 133* 125* 131* 786*   Basic Metabolic Panel: Recent Labs  Lab 09/23/17 0452 09/24/17 0431 09/24/17 1613 09/25/17 0633 09/26/17 0501 09/27/17 0419 09/28/17 1850 09/29/17 0524  NA 138 137 134* 134* 134* 132* 129* 130*  K 4.1 4.2 3.8 4.5 5.0 4.8 4.7 4.5  CL 105 103 101 102 100* 97* 93* 92*  CO2 28 28 25 27 29 30 29 31   GLUCOSE 133* 118* 242* 129* 140* 123* 124* 88  BUN 24* 29* 28* 37* 33* 28* 20 20  CREATININE 0.90 0.81 0.86 0.99 1.02* 0.90 0.87 0.94  CALCIUM 8.0* 8.1* 7.4* 7.7* 7.9* 8.0* 8.0* 7.8*  MG 2.0 1.8 1.5*  --  2.0 1.6*  --   --   PHOS  --  2.7 2.3*  --  3.3 3.3  --   --    GFR: Estimated Creatinine Clearance: 43.7 mL/min (by C-G formula based on SCr of 0.94 mg/dL). Liver Function Tests: Recent Labs  Lab 09/24/17 0431 09/27/17 0419  AST 15 16  ALT 10* 12*  ALKPHOS 45 55  BILITOT 0.5 0.5  PROT 5.0* 4.8*  ALBUMIN 2.6* 2.2*   No results for input(s): LIPASE, AMYLASE in the last 168 hours. No results for input(s): AMMONIA in the last 168 hours. Coagulation Profile: No results for input(s): INR, PROTIME in the last 168 hours. Cardiac Enzymes: No results for input(s): CKTOTAL, CKMB, CKMBINDEX, TROPONINI in the last 168 hours. BNP (last 3 results) No results for input(s): PROBNP in the last 8760 hours. HbA1C: No results for input(s): HGBA1C in the last 72 hours. CBG: Recent Labs  Lab 09/27/17 0554 09/27/17 1208 09/27/17 1719 09/28/17 0025 09/28/17 0536  GLUCAP 136* 143* 211* 99 94   Lipid Profile: No results for input(s): CHOL, HDL, LDLCALC, TRIG, CHOLHDL, LDLDIRECT in the last 72 hours. Thyroid Function Tests: No results for input(s): TSH, T4TOTAL, FREET4, T3FREE, THYROIDAB in the last 72 hours. Anemia Panel: No results for input(s): VITAMINB12, FOLATE, FERRITIN, TIBC, IRON, RETICCTPCT in the last 72 hours. Sepsis Labs: No results for input(s): PROCALCITON, LATICACIDVEN in the last 168  hours.  Recent Results (from the past 240 hour(s))  Surgical pcr screen     Status: None   Collection Time: 09/23/17 10:26 PM  Result Value Ref Range Status   MRSA, PCR NEGATIVE NEGATIVE Final   Staphylococcus aureus NEGATIVE NEGATIVE Final    Comment: (NOTE) The Xpert SA Assay (FDA approved for NASAL specimens in patients 33 years of age and older), is one component of a comprehensive surveillance program. It is not intended to diagnose infection nor to guide or monitor treatment.          Radiology Studies: No results found.      Scheduled Meds: . enoxaparin (LOVENOX) injection  40 mg Subcutaneous Q24H  . feeding supplement (ENSURE ENLIVE)  237 mL Oral BID BM  . levothyroxine  100 mcg Oral QAC breakfast  . mouth rinse  15 mL Mouth Rinse BID  . metoprolol tartrate  25 mg Oral BID  . pantoprazole  40 mg Oral Daily  . predniSONE  10 mg Oral Q breakfast   Continuous Infusions:    LOS: 13 days     Cordelia Poche, MD [facility] 09/29/2017, 12:21 PM Pager: [phone]  If 7PM-7AM, please contact night-coverage [email or web address] Password [email or web address] 09/29/2017, 12:21 PM

## 2017-09-29 NOTE — Evaluation (Signed)
Occupational Therapy Evaluation Patient Details Name: Terri [name] MRN: [id number] DOB: [birth year] Today's Date: 09/29/2017    History of Present Illness 83 y.o. female admitted on 09/16/17 for nausea, abdominal distention, and concern for SBO.  Pt underwent bx of mass found on CT on 09/19/17 (at [facility] as she was already scheduled to be there as an OP), pt s/p Exploratory laparotomy, omentectomy, resection of ileum and cecum on 09/24/17 with post op ostomy bag on the right.  Found to have metastatic adenocarcinoma with carcinomatosis.  Pt with significant PMH of TIA, HTN, HOH, Chron's disease, aortic stenosis/insufficiency, basal cell carinoma removal, per pt report she is legally blind (so she doesn't drive).    Clinical Impression   Pt presents with pain and limited activity tolerance upon OT evaluation. She was up in chair upon OT arrival but only willing to minimally participate in tasks (states she is too painful).  Therapist provided education on importance of participating in mobility and ADLs to improve activity tolerance.  Pt plans to go home with her children, stating they can provide 24/7 assist.  Recommending HHOT.  Will continue to follow acutely to address deficits listed below in order to improve safety and independence with ADLs prior to return home.    Follow Up Recommendations  Home health OT;Supervision/Assistance - 24 hour    Equipment Recommendations  None recommended by OT    Recommendations for Other Services       Precautions / Restrictions Precautions Precautions: Fall;Other (comment)(abdominal) Precaution Comments: check ostomy before mobility to make sure it is not too full of fecal matter or gas.   Restrictions Weight Bearing Restrictions: No      Mobility Bed Mobility               General bed mobility comments: Pt OOB and sitting up in chair.   Transfers Overall transfer level: Needs assistance   Transfers: Sit to/from Stand Sit to Stand:  Min guard              Balance                                           ADL either performed or assessed with clinical judgement   ADL Overall ADL's : Needs assistance/impaired Eating/Feeding: Set up;Sitting   Grooming: Wash/dry hands;Wash/dry face;Set up;Sitting   Upper Body Bathing: Moderate assistance;Sitting   Lower Body Bathing: Maximal assistance;Sit to/from stand   Upper Body Dressing : Moderate assistance;Sitting   Lower Body Dressing: Maximal assistance;Sit to/from stand                 General ADL Comments: Pt very painful this morning and refusing practicing functional transfers or bathing.  Pt up in chair upon therapist arrival and was willing to stand and get repositioned in chair.  Dicussed importance of mobility and participating in ADLs to increase activity tolerance.     Vision Baseline Vision/History: Legally blind       Perception     Praxis      Pertinent Vitals/Pain Pain Assessment: 0-10 Pain Score: 8  Pain Location: abdomen Pain Descriptors / Indicators: Sore Pain Intervention(s): Limited activity within patient's tolerance;Monitored during session     Hand Dominance Right   Extremity/Trunk Assessment Upper Extremity Assessment Upper Extremity Assessment: Overall WFL for tasks assessed           Communication Communication Communication: No  difficulties   Cognition Arousal/Alertness: Awake/alert Behavior During Therapy: WFL for tasks assessed/performed;Flat affect Overall Cognitive Status: Within Functional Limits for tasks assessed                                     General Comments       Exercises     Shoulder Instructions      Home Living Family/patient expects to be discharged to:: Private residence Living Arrangements: Children Available Help at Discharge: Family Type of Home: House Home Access: Stairs to enter Technical brewer of Steps: 4 Entrance Stairs-Rails:  Right Home Layout: One level     Bathroom Shower/Tub: Walk-in shower         Home Equipment: Environmental consultant - 4 wheels;Cane - single point;Bedside commode   Additional Comments: Pt is a retired Therapist, sports      Prior Functioning/Environment Level of Independence: Independent        Comments: Pt is generally independent with her mobility.  She does have access to a RW and a cane        OT Problem List: Decreased strength;Decreased activity tolerance;Impaired balance (sitting and/or standing);Decreased coordination;Decreased knowledge of use of DME or AE;Decreased knowledge of precautions;Pain      OT Treatment/Interventions: Self-care/ADL training;Therapeutic exercise;DME and/or AE instruction;Therapeutic activities;Patient/family education;Balance training    OT Goals(Current goals can be found in the care plan section) Acute Rehab OT Goals Patient Stated Goal: to decrease pain and go home with family OT Goal Formulation: With patient Time For Goal Achievement: 10/13/17 Potential to Achieve Goals: Good ADL Goals Pt Will Perform Upper Body Bathing: with supervision;sitting Pt Will Perform Lower Body Bathing: with supervision Pt Will Perform Upper Body Dressing: with supervision;sitting Pt Will Perform Lower Body Dressing: with supervision;sit to/from stand Pt Will Transfer to Toilet: with supervision;ambulating;regular height toilet;bedside commode  OT Frequency: Min 3X/week   Barriers to D/C:            Co-evaluation              AM-PAC PT "6 Clicks" Daily Activity     Outcome Measure Help from another person eating meals?: None Help from another person taking care of personal grooming?: None Help from another person toileting, which includes using toliet, bedpan, or urinal?: A Little Help from another person bathing (including washing, rinsing, drying)?: A Lot Help from another person to put on and taking off regular upper body clothing?: A Lot Help from another person to  put on and taking off regular lower body clothing?: A Lot 6 Click Score: 17   End of Session Equipment Utilized During Treatment: Rolling walker  Activity Tolerance: Patient limited by pain Patient left: with call bell/phone within reach;in chair  OT Visit Diagnosis: Unsteadiness on feet (R26.81);Muscle weakness (generalized) (M62.81);Pain Pain - part of body: (abdomen)                Time: 4665-9935 OT Time Calculation (min): 15 min Charges:  OT General Charges $OT Visit: 1 Visit OT Evaluation $OT Eval Moderate Complexity: 1 Mod G-Codes:       Darrol Jump OTR/L 09/29/2017, 9:29 AM

## 2017-09-29 NOTE — Progress Notes (Addendum)
5 Days Post-Op   Subjective/Chief Complaint: Complains of soreness   Objective: Vital signs in last 24 hours: Temp:  [98.3 F (36.8 C)-98.5 F (36.9 C)] 98.3 F (36.8 C) (11/17 0500) Pulse Rate:  [70-74] 71 (11/17 0500) Resp:  [16-18] 18 (11/17 0500) BP: (116-139)/(48-60) 116/48 (11/17 0500) SpO2:  [95 %-97 %] 95 % (11/17 0500) Weight:  [80.2 kg (176 lb 12.9 oz)] 80.2 kg (176 lb 12.9 oz) (11/17 0500) Last BM Date: 09/28/17  Intake/Output from previous day: 11/16 0701 - 11/17 0700 In: 520 [P.O.:520] Out: 1750 [Urine:800; Stool:950] Intake/Output this shift: No intake/output data recorded.  General appearance: alert and cooperative Resp: clear to auscultation bilaterally Cardio: regular rate and rhythm GI: soft, appropriately tender. ostomy pink and productive. incision ok  Lab Results:  Recent Labs    09/27/17 0419 09/28/17 0454  WBC 21.3* 16.8*  HGB 9.2* 9.0*  HCT 29.1* 28.1*  PLT 131* 137*   BMET Recent Labs    09/28/17 1850 09/29/17 0524  NA 129* 130*  K 4.7 4.5  CL 93* 92*  CO2 29 31  GLUCOSE 124* 88  BUN 20 20  CREATININE 0.87 0.94  CALCIUM 8.0* 7.8*   PT/INR No results for input(s): LABPROT, INR in the last 72 hours. ABG No results for input(s): PHART, HCO3 in the last 72 hours.  Invalid input(s): PCO2, PO2  Studies/Results: No results found.  Anti-infectives: Anti-infectives (From admission, onward)   Start     Dose/Rate Route Frequency Ordered Stop   09/24/17 1030  cefoTEtan in Dextrose 5% (CEFOTAN) 2-2.08 GM-% IVPB  Status:  Discontinued    Comments:  Hogue, Samantha   : cabinet override      09/24/17 1030 09/24/17 1036   09/24/17 1023  dextrose 5 % with cefOXitin (MEFOXIN) ADS Med  Status:  Discontinued    Comments:  Hogue, Samantha   : cabinet override      09/24/17 1023 09/24/17 1033   09/24/17 0800  cefoTEtan (CEFOTAN) 1 g in dextrose 5 % 50 mL IVPB     1 g 100 mL/hr over 30 Minutes Intravenous To Short Stay 09/23/17 2310  09/24/17 1149      Assessment/Plan: s/p Procedure(s): EXPLORATORY LAPAROTOMY (N/A) OMENTECTOMY (N/A) ILEOCECETOMY (N/A) ILEOSTOMY (N/A) Advance diet  Continue to work on pain control Ambulate Needs home health PT Not taking adequate po's for discharge  LOS: 13 days    TOTH III,Camdin Hegner S 09/29/2017

## 2017-09-30 ENCOUNTER — Inpatient Hospital Stay (HOSPITAL_COMMUNITY): Payer: Medicare Other

## 2017-09-30 LAB — MAGNESIUM: MAGNESIUM: 1.9 mg/dL (ref 1.7–2.4)

## 2017-09-30 LAB — CBC WITH DIFFERENTIAL/PLATELET
BASOS PCT: 0 %
Basophils Absolute: 0 10*3/uL (ref 0.0–0.1)
EOS PCT: 2 %
Eosinophils Absolute: 0.3 10*3/uL (ref 0.0–0.7)
LYMPHS ABS: 2.2 10*3/uL (ref 0.7–4.0)
Lymphocytes Relative: 13 %
Monocytes Absolute: 1.7 10*3/uL — ABNORMAL HIGH (ref 0.1–1.0)
Monocytes Relative: 10 %
NEUTROS PCT: 75 %
Neutro Abs: 12.5 10*3/uL — ABNORMAL HIGH (ref 1.7–7.7)

## 2017-09-30 LAB — BASIC METABOLIC PANEL
Anion gap: 7 (ref 5–15)
BUN: 12 mg/dL (ref 6–20)
CALCIUM: 7.8 mg/dL — AB (ref 8.9–10.3)
CO2: 30 mmol/L (ref 22–32)
Chloride: 94 mmol/L — ABNORMAL LOW (ref 101–111)
Creatinine, Ser: 0.97 mg/dL (ref 0.44–1.00)
GFR calc Af Amer: >60 mL/min (ref >60)
GFR, EST NON AFRICAN AMERICAN: 52 mL/min — AB (ref >60)
Glucose, Bld: 113 mg/dL — ABNORMAL HIGH (ref 65–99)
POTASSIUM: 4.1 mmol/L (ref 3.5–5.1)
SODIUM: 131 mmol/L — AB (ref 135–145)

## 2017-09-30 LAB — CBC
HCT: 29 % — ABNORMAL LOW (ref 36.0–46.0)
Hemoglobin: 9.3 g/dL — ABNORMAL LOW (ref 12.0–15.0)
MCH: 27.8 pg (ref 26.0–34.0)
MCHC: 32.1 g/dL (ref 30.0–36.0)
MCV: 86.8 fL (ref 78.0–100.0)
PLATELETS: 167 10*3/uL (ref 150–400)
RBC: 3.34 MIL/uL — AB (ref 3.87–5.11)
RDW: 13.9 % (ref 11.5–15.5)
WBC: 17.5 10*3/uL — AB (ref 4.0–10.5)

## 2017-09-30 LAB — PHOSPHORUS: PHOSPHORUS: 3.6 mg/dL (ref 2.5–4.6)

## 2017-09-30 MED ORDER — OXYCODONE-ACETAMINOPHEN 5-325 MG PO TABS: 1.0000 | ORAL_TABLET | ORAL | 0 refills | Status: AC | PRN

## 2017-09-30 NOTE — Discharge Instructions (Signed)
[facility]      [facility] °[phone] ° °OPEN ABDOMINAL SURGERY: POST OP INSTRUCTIONS ° °Always review your discharge instruction sheet given to you by the facility where your surgery was performed. ° °IF YOU HAVE DISABILITY OR FAMILY LEAVE FORMS, YOU MUST BRING THEM TO THE OFFICE FOR PROCESSING.  PLEASE DO NOT GIVE THEM TO YOUR DOCTOR. ° °1. A prescription for pain medication may be given to you upon discharge.  Take your pain medication as prescribed, if needed.  If narcotic pain medicine is not needed, then you may take acetaminophen (Tylenol) or ibuprofen (Advil) as needed. °2. Take your usually prescribed medications unless otherwise directed. °3. If you need a refill on your pain medication, please contact your pharmacy. They will contact our office to request authorization.  Prescriptions will not be filled after 5pm or on week-ends. °4. You should follow a light diet the first few days after arrival home, such as soup and crackers, pudding, etc.unless your doctor has advised otherwise. A high-fiber, low fat diet can be resumed as tolerated.   Be sure to include lots of fluids daily. Most patients will experience some swelling and bruising on the chest and neck area.  Ice packs will help.  Swelling and bruising can take several days to resolve °5. Most patients will experience some swelling and bruising in the area of the incision. Ice pack will help. Swelling and bruising can take several days to resolve..  °6. It is common to experience some constipation if taking pain medication after surgery.  Increasing fluid intake and taking a stool softener will usually help or prevent this problem from occurring.  A mild laxative (Milk of Magnesia or Miralax) should be taken according to package directions if there are no bowel movements after 48 hours. °7.  You may have steri-strips (small skin tapes) in place directly over the incision.  These strips should be left on the skin for 7-10 days.  If your  surgeon used skin glue on the incision, you may shower in 24 hours.  The glue will flake off over the next 2-3 weeks.  Any sutures or staples will be removed at the office during your follow-up visit. You may find that a light gauze bandage over your incision may keep your staples from being rubbed or pulled. You may shower and replace the bandage daily. °8. ACTIVITIES:  You may resume regular (light) daily activities beginning the next day--such as daily self-care, walking, climbing stairs--gradually increasing activities as tolerated.  You may have sexual intercourse when it is comfortable.  Refrain from any heavy lifting or straining until approved by your doctor. °a. You may drive when you no longer are taking prescription pain medication, you can comfortably wear a seatbelt, and you can safely maneuver your car and apply brakes °b. Return to Work: ___________________________________ °9. You should see your doctor in the office for a follow-up appointment approximately two weeks after your surgery.  Make sure that you call for this appointment within a day or two after you arrive home to insure a convenient appointment time. °OTHER INSTRUCTIONS:  °_____________________________________________________________ °_____________________________________________________________ ° °WHEN TO CALL YOUR DOCTOR: °1. Fever over 101.0 °2. Inability to urinate °3. Nausea and/or vomiting °4. Extreme swelling or bruising °5. Continued bleeding from incision. °6. Increased pain, redness, or drainage from the incision. °7. Difficulty swallowing or breathing °8. Muscle cramping or spasms. °9. Numbness or tingling in hands or feet or around lips. ° °The clinic staff is available to   answer your questions during regular business hours.  Please dont hesitate to call and ask to speak to one of the nurses if you have concerns.  For further questions, please visit [email or web address]   Colostomy Home Guide, Adult  (Yours is  an ileostomy, but care is the same) A colostomy is a surgical procedure to make an opening (stoma) for stool (feces) to leave your body. This surgery is done when a medical condition prevents stool from leaving your body through the end of the large intestine (rectum). During the surgery, part of the large intestine (colon) is attached to the stoma that is made in the front of your abdomen. A bag (pouch) is fitted over the stoma. Stool and gas will collect in the bag. After having this surgery, you will need to empty and change your colostomy bag as needed. You will also need to care for the stoma. How do I care for my stoma? Your stoma should look pink, red, and moist, like the inside of your cheek. At first, the stoma may be swollen, but this swelling will go away within 6 weeks. To care for the stoma:  Keep the skin around the stoma clean and dry.  Use a clean, soft washcloth to gently wash the stoma and the skin around it. ? Use warm water and only use cleansers recommended by your health care provider. ? Rinse the stoma area with plain water. ? Dry the area well.  Use stoma powder or ointment on your skin only as told by your health care provider. Do not use any other powders, gels, wipes, or creams on your skin.  Change your colostomy bag if your skin becomes irritated. Irritation may indicate that the bag is leaking.  Check your stoma area every day for signs of infection. Check for: ? More redness, swelling, or pain. ? More fluid or blood. ? Pus or warmth.  Measure the stoma opening regularly and record the size. Watch for changes. Share this information with your health care provider.  How do I care for my colostomy bag? The bag that fits over the stoma can have either one or two pieces.  One-piece bag: The skin barrier and the bag are combined in a single unit.  Two-piece bag: The skin barrier and the bag are separate pieces that attach to each other. 1.   Empty your bag at  bedtime and whenever it is one-third to one-half full. Do not let more stool or gas build up. This could cause the bag to leak. Some colostomy bags have a built-in gas release valve. Change the bag every 3-4 days or as told by your health care provider. Also change the bag if it is leaking or separating from the skin or your skin looks irritated. How do I empty my colostomy bag? Before you leave the hospital, you will be taught how to empty your bag. Follow these basic steps: 1. Wash your hands with soap and water. 2. Sit far back on the toilet. 3. Put several pieces of toilet paper into the toilet water. This will prevent splashing as you empty the stool into the toilet. 4. Remove the clip or the velcro from the tail end of the bag. 5. Unroll the tail, then empty stool into the toilet. 6. Clean the tail with toilet paper. 1.  7. Reroll the tail, and close it with the clip or velcro. 8. Wash your hands again.  How do I change my colostomy bag? Before you leave the  hospital, you will be taught how to change your bag. Always have colostomy supplies with you, and follow these basic steps: 1. Wash your hands with soap and water. Have paper towels or tissues near you to clean any discharge. 2. Use a template to pre-cut the skin barrier. Smooth any rough edges. 3. If using a two-piece bag, attach the bag and the skin barrier to each other. Add the barrier ring, if you use one. 4. If your stools are watery, add a few cotton balls to the new bag to absorb the liquid. 5. Remove the old bag and skin barrier. Gently push the skin away from the barrier with your fingers or a warm cloth. 6. Wash your hands again. Then clean the stoma area as directed with water or with mild soap and water. Use water to rinse away any soap. 7. Dry the skin. You may use the cool setting on a hair dryer to do this. 8. If directed, apply stoma powder or skin barrier gel to the skin. 9. Dry the skin again. 10. Warm the skin  barrier with your hands or a warm compress. 11. Remove the paper from the sticky (adhesive) strip of the skin barrier. 12. Press the adhesive strip onto the skin around the stoma. 13. Gently rub the skin barrier onto the skin. This creates heat that helps the barrier to stick. 14. Apply stoma tape to the edges of the skin barrier.  What are some general tips?  Avoid wearing clothes that are tight directly over your stoma.  You may shower or bathe with the colostomy bag on or off. Do not use harsh or oily soaps or lotions. Dry the skin and bag after bathing.  Store all supplies in a cool, dry place. Do not leave supplies in extreme heat because parts can melt.  Whenever you leave home, take an extra skin barrier and colostomy bag with you.  If your colostomy bag gets wet, you can dry it with a hair dryer on the cool setting.  To prevent odor, put drops of ostomy deodorizer in the colostomy bag. Your health care provider may also recommend putting ostomy lubricant inside the bag. This helps the stool to slide out of the bag more easily and completely. Contact a health care provider if:  You have more redness, swelling, or pain around your stoma.  You have more fluid or blood coming from your stoma.  Your stoma feels warm to the touch.  You have pus coming from your stoma.  Your stoma extends in or out farther than normal.  You need to change the bag every day.  You have a fever. Get help right away if:  Your stool is bloody.  You vomit.  You have trouble breathing. This information is not intended to replace advice given to you by your health care provider. Make sure you discuss any questions you have with your health care provider. Document Released: 11/02/2003 Document Revised: 03/09/2016 Document Reviewed: 03/04/2014 Elsevier Interactive Patient Education  [employer].

## 2017-09-30 NOTE — Progress Notes (Signed)
Radiology called DG Abd Port result showing significant free intra-abdominal air noted projecting over the right side of abdomen, may be post-op in nature, though would correlate clinically for symptoms of perforation. Recommended for further imaging if deemed clinically appropriate by Radiology.  Floor coverage TRH, X. Blount was made aware thru text page. Patient now resting on bed comfortably with both eyes closed. Will monitor.

## 2017-09-30 NOTE — Discharge Summary (Signed)
Physician Discharge Summary  Terri [name] QHU:[id number] DOB: Dec 31, 1930 DOA: 09/16/2017  PCP: Prince Solian, MD  Admit date: 09/16/2017 Discharge date: 09/30/2017  Admitted From: Home Disposition: Home  Recommendations for Outpatient Follow-up:  1. Follow up with PCP in 1 week 2. Follow up with Oncology 3. Follow up with General surgery 4. Please obtain BMP/CBC in one week 5. Please follow up on the following pending results: None  Home Health: RN Equipment/Devices: None  Discharge Condition: Guarded CODE STATUS: DNR Diet recommendation: heart healthy   Brief/Interim Summary:  Admission HPI written by Etta Quill, DO   Chief Complaint: Abd pain  HPI: Terri [name] is a 83 y.o. female with medical history significant of Crohn's disease diagnosed x5 years ago.  SBO in July this year felt to be secondary to Crohn's disease.  SBO resolved / improved after NGT and steroids though she has been having some abdominal symptoms in the intervening time.  Patient presents to the ED with c/o nausea, abd distention, concern for another SBO.  Of note patient just had CT scan on 10/23 which showed findings suggestive of Crohns, omental nodularity worrisome for malignancy (patient had biopsy scheduled at [facility] on Wed this week), and foreign body in terminal ilium, possibly a pill or tablet that has been present since CT scan in July of this year.   ED Course: NGT placed, GI recs repeat CT and will see in AM.  Put on solumedrol 125mg .    Hospital course:  Small bowel obstruction Recurrent. Secondary to metastatic adenocarcinoma with carcinomatosis. S/p ex lap/ileocecectomy and end ileostomy on 09/24/17. During her course, she developed worsened abdominal pain with good output from her ostomy. She was initially controlled on Tylenol #3, but required Percocet for continued pain management. X-ray obtained and was suggestive of free air with concern for perforation. Repeat  chest/abd x-ray did not confirm free air, however. General surgery advanced patient's diet from TPN to full liquid. Patient discharge without TPN and with good output from ostomy. She will follow-up with general surgery as an outpatient.  Hypomagnesemia Given magnesium with resolution. Recheck as an outpatient.  Omental nodularity Metastatic adenocarcinoma with peritoneal carcinomatosis Biopsy returned with confirmed invasive metastatic adenocarcinoma. Oncology follow-up made. Options to discussed as an outpatient. Recommend hospice if patient decides on no treatment.  Crohn's disease Initially patient was thought to have a flair and she was given IV steroids. This was tapered down to home dose of prednisone 10 mg. Patient was on a taper as an outpatient and will follow-up with GI for continued taper.  Foreign body of small intestine 17 mm object noted on CT abdomen/pelvis. Outpatient follow-up with surgery.  Hypothyroidism Continued Synthroid   Atrial fibrillation with RVR Intraoperative. Returned to sinus rhythm.  Leukocytosis Secondary to demargination from steroids vs tumor burden. Afebrile. No evidence of infection. Trending down since stopping IV steroids but became stable. Recommend repeat CBC for trend.  Thrombocytopenia Secondary to acute illness. Resolved before discharge.   Discharge Diagnoses:  Principal Problem:   SBO (small bowel obstruction) (HCC) Active Problems:   Crohn's disease ([place])   Crohn's ileitis, with intestinal obstruction ([place])   Foreign body of small intestine, subsequent encounter   Abnormal CT of the abdomen   Abdominal carcinomatosis [facility])   Palliative care encounter   Goals of care, counseling/discussion    Discharge Instructions  Discharge Instructions    Call MD for:  difficulty breathing, headache or visual disturbances   Complete by:  As directed  Call MD for:  persistant dizziness or light-headedness   Complete by:  As  directed    Call MD for:  redness, tenderness, or signs of infection (pain, swelling, redness, odor or green/yellow discharge around incision site)   Complete by:  As directed    Call MD for:  severe uncontrolled pain   Complete by:  As directed    Call MD for:  temperature >100.4   Complete by:  As directed    Diet - low sodium heart healthy   Complete by:  As directed    Increase activity slowly   Complete by:  As directed      Allergies as of 09/30/2017      Reactions   Lidocaine    Claims that it made her heart beat go irregular      Medication List    STOP taking these medications   amoxicillin-clavulanate 875-125 MG tablet Commonly known as:  AUGMENTIN   losartan 50 MG tablet Commonly known as:  COZAAR     TAKE these medications   carboxymethylcellulose 0.5 % Soln Commonly known as:  REFRESH PLUS Place 1 drop into both eyes daily as needed (dry eyes).   clidinium-chlordiazePOXIDE 5-2.5 MG capsule Commonly known as:  LIBRAX Take 1 capsule daily by mouth.   clopidogrel 75 MG tablet Commonly known as:  PLAVIX Take 75 mg by mouth daily.   folic acid 1 MG tablet Commonly known as:  FOLVITE Take 1 mg by mouth daily.   levothyroxine 100 MCG tablet Commonly known as:  SYNTHROID, LEVOTHROID Take 100 mcg by mouth daily before breakfast.   multivitamin with minerals Tabs tablet Take 1 tablet by mouth daily.   ondansetron 4 MG tablet Commonly known as:  ZOFRAN Take 1 tablet (4 mg total) by mouth every 6 (six) hours as needed for nausea.   oxybutynin 5 MG tablet Commonly known as:  DITROPAN Take 5 mg by mouth daily.   oxyCODONE-acetaminophen 5-325 MG tablet Commonly known as:  PERCOCET/ROXICET Take 1 tablet every 4 (four) hours as needed by mouth for moderate pain.   pantoprazole 40 MG tablet Commonly known as:  PROTONIX Take 40 mg by mouth every morning.   predniSONE 10 MG tablet Commonly known as:  DELTASONE Take 10 mg daily with breakfast by  mouth. What changed:  Another medication with the same name was removed. Continue taking this medication, and follow the directions you see here.   sodium chloride 0.65 % Soln nasal spray Commonly known as:  OCEAN Place 2 sprays as needed into both nostrils for congestion.      Follow-up Information    Ladell Pier, MD Follow up on 10/09/2017.   Specialty:  Oncology Why:  your appointment is at 2 PM Contact information: [place] [address] [phone]        Donnie Mesa, MD Follow up on 10/15/2017.   Specialty:  General Surgery Why:  Your appointment is at 8:40 AM, be at the office 30 minutes early for check in.  Bring photo ID and insurance information.   Contact information: [address] [place] [address] [phone]        Sur[facility], [place] Follow up on 10/08/2017.   Specialty:  General Surgery Why:  Staple removal at 10 AM with the nurse.  Be at the office 30 minutes early for check in. Bring photo ID and insurance information. Contact information: [place] [address]  [phone]          Allergies  Allergen Reactions  . Lidocaine     Claims that it made her heart beat go irregular    Consultations:  General surgery  Palliative care medicine  Oncology   Procedures/Studies: Dg Abd 1 View  Result Date: 09/21/2017 CLINICAL DATA:  Small bowel obstruction.  Repositioning of NG tube. EXAM: ABDOMEN - 1 VIEW FLUOROSCOPY TIME:  30 seconds COMPARISON:  Abdominal radiographs dated 09/21/2017 at 7:43 a.m. FINDINGS: NG tube was advanced under direct fluoroscopic visualization. The tip of the tube is now in the distal stomach. The stomach is decompressed. IMPRESSION: NG tube advanced into the distal stomach. The stomach is decompressed. Electronically Signed   By: Lorriane Shire M.D.   On: 09/21/2017 10:22   Ct Abdomen Pelvis W Contrast  Result Date: 09/16/2017 CLINICAL DATA:   Bowel obstruction EXAM: CT ABDOMEN AND PELVIS WITH CONTRAST TECHNIQUE: Multidetector CT imaging of the abdomen and pelvis was performed using the standard protocol following bolus administration of intravenous contrast. CONTRAST:  179mL ISOVUE-300 IOPAMIDOL (ISOVUE-300) INJECTION 61% COMPARISON:  09/16/2017, 09/04/2017, 05/31/2017 FINDINGS: Lower chest: Lung bases demonstrate no acute consolidation or pleural effusion. Heart size upper normal. Atherosclerotic calcifications. Hepatobiliary: Surgical absence of gallbladder. Mild intra and extrahepatic biliary enlargement similar compared to prior. Pancreas: Atrophic.  No inflammation Spleen: Normal in size without focal abnormality. Adrenals/Urinary Tract: Adrenal glands are within normal limits. Multifocal cortical scarring in the kidneys. No hydronephrosis. The bladder is within normal limits Stomach/Bowel: Esophageal tube is present, the tip terminates along the wall of the proximal stomach. Multiple loops of markedly dilated, fluid-filled small bowel, measuring up to 6 cm in diameter. Transition point visualized in the right pelvis, series 3, image number 69 and is related to a disease segment of terminal ileum which appears very thickened. The colon is collapsed. Re- demonstrated rectangular 17 mm foreign object in the terminal ileum. Vascular/Lymphatic: Aortic atherosclerosis. No enlarged abdominal or pelvic lymph nodes. Reproductive: Uterus and bilateral adnexa are unremarkable. Other: Negative for free air. Tiny amount of free fluid in the pelvis. Re- demonstrated mild nodularity of the omentum most notable in the right upper quadrant. Musculoskeletal: Degenerative changes of the spine. No acute or suspicious bone lesion. IMPRESSION: 1. Findings consistent with high-grade mechanical small bowel obstruction. Transition point visualized in the right lower quadrant/pelvis and is related to a diseased segment of terminal ileum which demonstrates significant wall  thickening and possible stricturing. Negative for free air. 2. Re- demonstrated 17 mm rectangular foreign object in the terminal ileum. 3. Mild nodularity of the omentum re- demonstrated, correlation with tumor markers previously recommended. Electronically Signed   By: Donavan Foil M.D.   On: 09/16/2017 22:14   Ct Abd Limited W/o Cm  Result Date: 09/19/2017 CLINICAL DATA:  83 year old female with a history of Crohn's disease, with CT dated 05/31/2017 showing bowel obstruction. Follow-up CT imaging September 04, 2017 again shows evidence of Crohn disease, with new omental nodularity. Patient was referred as outpatient for CT-guided omental biopsy. In the interval the patient was admitted with recurrent bowel obstruction, 09/16/2017. Patient presents as inpatient for attempted omental biopsy. EXAM: CT ABDOMEN WITHOUT CONTRAST LIMITED TECHNIQUE: Multidetector CT imaging of the abdomen was performed following the standard protocol without IV contrast. COMPARISON:  Multiple prior, most recent 09/16/2017, 09/04/2017 FINDINGS: Limited CT of the abdomen was performed for planning purposes for a scheduled CT-guided omental biopsy. Initial image demonstrates calcific density in the distal small bowel, unchanged in  position, uncertain etiology. Again, this does not appear to represent the site of the obstruction, in this patient with changes of Crohn's disease and likely strictures. The omental nodularity is not as well-defined on the current CT study. The lower abdominal nodularity is mass thigh reactive ascitic fluid, with no safe target identified during the targeting phase of the procedure. There are small nodules in the upper abdomen adjacent to the distal stomach that were are also targeted, without successful identification given respiratory motion and the size of the nodules, which are smaller than the slice thicknesses of the planning CT. Given that there was no safe site for biopsy, biopsy deferred at this time.  Body wall and omental anasarca/edema. Reactive ascitic fluid within small bowel loops, particularly at the right lower quadrant at the site of the previously suggested biopsy target. Evidence of continued bowel obstruction. IMPRESSION: Limited noncontrast CT during planning phase for attempted omental nodule CT-guided biopsy demonstrates there is no safe target on today's study, with the most reasonable site for biopsy masked by edema and ascitic fluid. Biopsy was deferred at this time. Images also demonstrate persistent small bowel obstruction, with unchanged position of the dense object at the ileocecal valve. Signed, Dulcy Fanny. Earleen Newport, DO [facility] Radiology PLAN: Would suggest another attempt at omental guided biopsy once the patient's condition improves, of small bowel obstruction and reactive changes/ascites resolves. Either CT-guided biopsy or repeat diagnostic CT scan may be considered. Electronically Signed   By: Corrie Mckusick D.O.   On: 09/19/2017 10:28   Ct Entero Abd/pelvis W Contast  Result Date: 09/04/2017 CLINICAL DATA:  83 year old female with history of mid abdominal pain for the past 4 months. History of Crohn's disease. EXAM: CT ABDOMEN AND PELVIS WITH CONTRAST (ENTEROGRAPHY) TECHNIQUE: Multidetector CT of the abdomen and pelvis during bolus administration of intravenous contrast. Negative oral contrast was given. CONTRAST:  139mL ISOVUE-300 IOPAMIDOL (ISOVUE-300) INJECTION 61% COMPARISON:  CT the abdomen and pelvis 05/31/2017. FINDINGS: Lower chest: Calcifications of the aortic valve. Mild cardiomegaly. Hepatobiliary: No cystic or solid hepatic lesions. No intra or extrahepatic biliary ductal dilatation. Gallbladder is not visualized and presumably surgically absent (no surgical clips are noted in the gallbladder fossa). Pancreas: No pancreatic mass. No pancreatic ductal dilatation. No pancreatic or peripancreatic fluid or inflammatory  changes. Spleen: Unremarkable. Adrenals/Urinary Tract: Multifocal cortical thinning in the kidneys bilaterally. No suspicious renal lesions. No hydroureteronephrosis. Urinary bladder is normal in appearance. Bilateral adrenal glands nor appearance. Stomach/Bowel: The appearance of the stomach is normal. There is no pathologic dilatation of small bowel or colon. Enterography images demonstrate profound mural thickening of the distal and terminal ileum, with hyperenhancement of the associated mucosa. No overt surrounding inflammatory changes are noted at this time. No other focal areas of mural thickening are noted in the small bowel or colon. In the lumen of the terminal ileum shortly before the ileocecal valve there is again a dense cylindrical shaped foreign body measuring 16 x 13 x 13 mm. Normal appendix. Vascular/Lymphatic: Aortic atherosclerosis, without evidence of aneurysm or dissection in the abdominal or pelvic vasculature. No lymphadenopathy noted in the abdomen or pelvis. Reproductive: Uterus and ovaries are unremarkable in appearance. Other: Multiple areas of apparent soft tissue nodularity throughout the omentum, measuring up to 9 mm in diameter (axial image 77 of series 3). No significant volume of ascites. No pneumoperitoneum. Mild diffuse body wall edema. Musculoskeletal: There are no aggressive appearing lytic or blastic lesions noted in the visualized portions of  the skeleton. IMPRESSION: 1. Extensive mural thickening and mucosal hyperenhancement throughout the distal and terminal ileum, compatible with the reported clinical history of Crohn's disease. At this time, there are no overt surrounding inflammatory changes to suggest Crohn's exacerbation. 2. Unusual soft tissue nodularity noted throughout the omentum. This is of uncertain etiology and significance, however, this is most commonly seen in the setting of intraperitoneal malignancy. Further clinical evaluation and correlation with tumor  markers is suggested. 3. Previously noted intraluminal form body remains in position in the terminal ileum. This is 8 dense cylindrical object measuring 16 x 13 x 13 mm. 4. Aortic atherosclerosis. 5. There are calcifications of the aortic valve. Echocardiographic correlation for evaluation of potential valvular dysfunction may be warranted if clinically indicated. 6. Additional incidental findings, as above. Aortic Atherosclerosis (ICD10-I70.0). Electronically Signed   By: Vinnie Langton M.D.   On: 09/04/2017 10:43   Dg Chest Port 1 View  Result Date: 09/25/2017 CLINICAL DATA:  Atelectasis. EXAM: PORTABLE CHEST 1 VIEW COMPARISON:  09/16/2017. FINDINGS: Interim placement NG tube, its tip is below left hemidiaphragm. Interim placement right PICC line, its tip is in the upper portion the right atrium. Mild cardiomegaly and pulmonary venous congestion. Mild basilar atelectasis. No prominent pleural effusion or pneumothorax. IMPRESSION: 1. Interim placement NG tube, its tip is below left hemidiaphragm. Interim placement of right PICC line, its tip is in the upper portion of the right atrium. 2. Mild cardiomegaly and pulmonary venous congestion. 3. Mild basilar atelectasis . Electronically Signed   By: Marcello Moores  Register   On: 09/25/2017 06:57   Dg Abd Acute W/chest  Result Date: 09/30/2017 CLINICAL DATA:  Abdominal pain EXAM: DG ABDOMEN ACUTE W/ 1V CHEST COMPARISON:  09/29/2017 abdominal pills.  Chest 09/25/2017 FINDINGS: Right PICC line tip is in the lower SVC. Left base atelectasis. Heart is normal size. No effusions. Mildly prominent abdominal loops in the left abdomen, likely focal ileus. No evidence for bowel obstruction or free air. No organomegaly. IMPRESSION: Suspect mild focal ileus with mildly prominent left abdominal large and small bowel loops. Left base atelectasis. Electronically Signed   By: Rolm Baptise M.D.   On: 09/30/2017 12:09   Dg Abdomen Acute W/chest  Result Date: 09/16/2017 CLINICAL  DATA:  Initial evaluation for acute vomiting, abdominal pain. EXAM: DG ABDOMEN ACUTE W/ 1V CHEST COMPARISON:  Prior CT from 09/04/2017. FINDINGS: Cardiac and mediastinal silhouettes within normal limits. Lungs hypoinflated. Mild bibasilar atelectasis. No focal infiltrates. No pulmonary edema or pleural effusion. No pneumothorax. Multiple dilated gas-filled loops of small bowel seen within the upper mid and left abdomen. These measure up to 5.2 cm. Associated air-fluid levels. Findings consistent with small bowel obstruction. No soft tissue mass or abnormal calcification. No free air. IMPRESSION: 1. Multiple dilated gas-filled loops of small bowel with associated air-fluid levels, consistent with small bowel obstruction. 2. Shallow lung inflation with mild bibasilar atelectasis. Electronically Signed   By: Jeannine Boga M.D.   On: 09/16/2017 19:16   Dg Abd Portable 1v  Result Date: 09/30/2017 CLINICAL DATA:  Acute onset of generalized abdominal pain. EXAM: PORTABLE ABDOMEN - 1 VIEW COMPARISON:  Abdominal radiograph performed 09/23/2017 FINDINGS: There appears to be significant free intra-abdominal air projecting over the right side of the abdomen. This may simply be postoperative in nature, though would correlate clinically for symptoms of perforation. Skin staples are noted along the left abdominal wall. The visualized bowel gas pattern is grossly unremarkable, with air noted in the colon. The visualized osseous structures  are within normal limits; the sacroiliac joints are unremarkable in appearance. IMPRESSION: Significant free intra-abdominal air noted projecting over the right side of the abdomen. This may simply be postoperative in nature, though would correlate clinically for symptoms of perforation, and consider further imaging if deemed clinically appropriate. These results were called by telephone at the time of interpretation on 09/30/2017 at 12:58 am to [facility], who verbally  acknowledged these results. Electronically Signed   By: Garald Balding M.D.   On: 09/30/2017 01:00   Dg Abd Portable 1v  Result Date: 09/23/2017 CLINICAL DATA:  Small bowel obstruction EXAM: PORTABLE ABDOMEN - 1 VIEW COMPARISON:  09/21/2017; 09/20/2017 FINDINGS: Re- demonstrated marked gas distention of several centralized loops of rather patulous appearing small bowel with index loop within the left mid hemiabdomen measuring approximately 5.5 cm in diameter, unchanged. These findings are again associated with a conspicuous paucity of distal colonic gas. Nondiagnostic evaluation for pneumoperitoneum secondary supine positioning and exclusion of the lower thorax. No pneumatosis or portal venous gas. Enteric tube tip and side port projected the expected location of the gastric fundus. Several phleboliths overlie the left hemipelvis. Otherwise, no definitive abnormal intra-abdominal calcifications. No definite acute osseus abnormalities. IMPRESSION: Similar findings worrisome for high a grade small-bowel obstruction. Electronically Signed   By: Sandi Mariscal M.D.   On: 09/23/2017 09:02   Dg Abd Portable 1v  Result Date: 09/21/2017 CLINICAL DATA:  Follow-up ileus. EXAM: PORTABLE ABDOMEN - 1 VIEW COMPARISON:  09/20/2017 FINDINGS: Nasogastric tube is in place, tip overlying the very proximal aspect of the stomach. There are significantly dilated central small bowel loops, measuring up to 5.6 cm. Gas is identified within nondilated loops of large bowel. No evidence for free intraperitoneal air. IMPRESSION: Findings consistent with persistent high-grade small bowel obstruction. Electronically Signed   By: Nolon Nations M.D.   On: 09/21/2017 08:18   Dg Abd Portable 1v  Result Date: 09/20/2017 CLINICAL DATA:  83 year old female with small bowel obstruction. EXAM: PORTABLE ABDOMEN - 1 VIEW COMPARISON:  09/18/2017 FINDINGS: Moderately distended loops of gas-filled small bowel are again seen throughout the mid  abdomen. Air is identified within the colon and rectum. Please note, evaluation for free intraperitoneal air is limited on single supine radiograph. No significant osseous abnormalities. IMPRESSION: 1. Persistent moderate distention of gas-filled small bowel loops within the mid abdomen. 2. Air is now identified within the colon and rectum. Electronically Signed   By: Kristopher Oppenheim M.D.   On: 09/20/2017 08:12   Dg Abd Portable 1v  Result Date: 09/18/2017 CLINICAL DATA:  Abdominal pain, small bowel obstruction. EXAM: PORTABLE ABDOMEN - 1 VIEW COMPARISON:  Abdominal CT scan of September 16, 2017 and abdominal series of the same day. FINDINGS: There remain loops of moderately distended gas-filled small bowel in the midline. The uric free of gaseous distention of portions of the jejunum reaches 8.5 cm. No significant gas or stool is observed in the colon. The stomach is nondistended. The esophagogastric tube tip in proximal port project in the gastric cardia. There is contrast material within the urinary bladder. IMPRESSION: Persistent mid to distal small bowel obstruction with significant gaseous distention of small-bowel loops. No evidence of perforation currently. Electronically Signed   By: David  Martinique M.D.   On: 09/18/2017 10:44      Subjective: Abdominal pain improved. No nausea or vomiting today.  Discharge Exam: Vitals:   09/30/17 0522 09/30/17 1534  BP: (!) 115/41 (!) 126/49  Pulse: 74 70  Resp: 16 16  Temp: 98.7 F (37.1 C) 98.1 F (36.7 C)  SpO2: 92% 90%   Vitals:   09/29/17 2154 09/29/17 2253 09/30/17 0522 09/30/17 1534  BP: (!) 86/44 (!) 111/58 (!) 115/41 (!) 126/49  Pulse: 64 68 74 70  Resp: 16 16 16 16   Temp: 98.4 F (36.9 C)  98.7 F (37.1 C) 98.1 F (36.7 C)  TempSrc: Oral   Oral  SpO2: 93% 93% 92% 90%  Weight:   79.4 kg (175 lb 0.7 oz)   Height:        General exam: Appears calm and comfortable Respiratory system: Clear to auscultation. Respiratory effort  normal. Cardiovascular system: S1 & S2 heard, RRR. 2/6 systolic murmur Gastrointestinal system: Soft, minimally tender, non-distended, no guarding, no rebound. Ostomy bag with green liquid stool Central nervous system: Alert and oriented. No focal neurological deficits. Extremities: No edema. No calf tenderness Skin: No cyanosis. No rashes Psychiatry: Judgement and insight appear normal. Mood & affect appropriate.     The results of significant diagnostics from this hospitalization (including imaging, microbiology, ancillary and laboratory) are listed below for reference.     Microbiology: Recent Results (from the past 240 hour(s))  Surgical pcr screen     Status: None   Collection Time: 09/23/17 10:26 PM  Result Value Ref Range Status   MRSA, PCR NEGATIVE NEGATIVE Final   Staphylococcus aureus NEGATIVE NEGATIVE Final    Comment: (NOTE) The Xpert SA Assay (FDA approved for NASAL specimens in patients 17 years of age and older), is one component of a comprehensive surveillance program. It is not intended to diagnose infection nor to guide or monitor treatment.      Labs: BNP (last 3 results) No results for input(s): BNP in the last 8760 hours. Basic Metabolic Panel: Recent Labs  Lab 09/24/17 1613  09/26/17 0501 09/27/17 0419 09/28/17 1850 09/29/17 0524 09/29/17 1132 09/30/17 0830  NA 134*   < > 134* 132* 129* 130*  --  131*  K 3.8   < > 5.0 4.8 4.7 4.5  --  4.1  CL 101   < > 100* 97* 93* 92*  --  94*  CO2 25   < > 29 30 29 31   --  30  GLUCOSE 242*   < > 140* 123* 124* 88  --  113*  BUN 28*   < > 33* 28* 20 20  --  12  CREATININE 0.86   < > 1.02* 0.90 0.87 0.94  --  0.97  CALCIUM 7.4*   < > 7.9* 8.0* 8.0* 7.8*  --  7.8*  MG 1.5*  --  2.0 1.6*  --   --  1.4* 1.9  PHOS 2.3*  --  3.3 3.3  --   --  3.2 3.6   < > = values in this interval not displayed.   Liver Function Tests: Recent Labs  Lab 09/24/17 0431 09/27/17 0419  AST 15 16  ALT 10* 12*  ALKPHOS 45 55   BILITOT 0.5 0.5  PROT 5.0* 4.8*  ALBUMIN 2.6* 2.2*   No results for input(s): LIPASE, AMYLASE in the last 168 hours. No results for input(s): AMMONIA in the last 168 hours. CBC: Recent Labs  Lab 09/24/17 0431 09/25/17 0633 09/26/17 0501 09/27/17 0419 09/28/17 0454 09/30/17 0830  WBC 12.2* 21.9* 21.2* 21.3* 16.8* DUPL SEE [id number]  17.5*  NEUTROABS 8.4*  --   --   --   --  PENDING  HGB 10.2*  10.5* 10.0* 9.2* 9.0* DUPL SEE [id number]  9.3*  HCT 32.4* 32.8* 31.7* 29.1* 28.1* DUPL SEE [id number]  29.0*  MCV 88.5 89.9 88.5 87.4 87.5 DUPL SEE [id number]  86.8  PLT 152 133* 125* 131* 137* DUPL SEE [id number]  167   Cardiac Enzymes: No results for input(s): CKTOTAL, CKMB, CKMBINDEX, TROPONINI in the last 168 hours. BNP: Invalid input(s): POCBNP CBG: Recent Labs  Lab 09/27/17 0554 09/27/17 1208 09/27/17 1719 09/28/17 0025 09/28/17 0536  GLUCAP 136* 143* 211* 99 94   D-Dimer No results for input(s): DDIMER in the last 72 hours. Hgb A1c No results for input(s): HGBA1C in the last 72 hours. Lipid Profile No results for input(s): CHOL, HDL, LDLCALC, TRIG, CHOLHDL, LDLDIRECT in the last 72 hours. Thyroid function studies No results for input(s): TSH, T4TOTAL, T3FREE, THYROIDAB in the last 72 hours.  Invalid input(s): FREET3 Anemia work up No results for input(s): VITAMINB12, FOLATE, FERRITIN, TIBC, IRON, RETICCTPCT in the last 72 hours. Urinalysis    Component Value Date/Time   COLORURINE YELLOW 09/17/2017 0425   APPEARANCEUR CLEAR 09/17/2017 0425   LABSPEC 1.044 (H) 09/17/2017 0425   PHURINE 5.0 09/17/2017 0425   GLUCOSEU NEGATIVE 09/17/2017 0425   HGBUR NEGATIVE 09/17/2017 0425   BILIRUBINUR NEGATIVE 09/17/2017 0425   KETONESUR NEGATIVE 09/17/2017 0425   PROTEINUR NEGATIVE 09/17/2017 0425   NITRITE NEGATIVE 09/17/2017 0425   LEUKOCYTESUR NEGATIVE 09/17/2017 0425   Sepsis Labs Invalid input(s): PROCALCITONIN,  WBC,  LACTICIDVEN Microbiology Recent Results (from the past 240  hour(s))  Surgical pcr screen     Status: None   Collection Time: 09/23/17 10:26 PM  Result Value Ref Range Status   MRSA, PCR NEGATIVE NEGATIVE Final   Staphylococcus aureus NEGATIVE NEGATIVE Final    Comment: (NOTE) The Xpert SA Assay (FDA approved for NASAL specimens in patients 64 years of age and older), is one component of a comprehensive surveillance program. It is not intended to diagnose infection nor to guide or monitor treatment.      Time coordinating discharge: Over 30 minutes  SIGNED:   Cordelia Poche, MD [facility] 09/30/2017, 3:39 PM Pager [phone]  If 7PM-7AM, please contact night-coverage [email or web address] Password [email or web address]

## 2017-09-30 NOTE — Progress Notes (Signed)
Dr. Jeannette Corpus called back and inform this RN that this finding will be relayed to general surgery for further assessment.  Patient just came to bathroom to void and emptied ileostomy bag from liquid stools and air.  Patient now resting back on bed comfortably with eyes closed. Will monitor.

## 2017-09-30 NOTE — Progress Notes (Signed)
Discharge paperwork reviewed with patient and patient's son. No questions verbalized. Patient is ready for discharge.

## 2017-09-30 NOTE — Progress Notes (Signed)
[facility] Progress Note  6 Days Post-Op  Subjective: CC: abdominal pain  Patient states she is still having some abdominal pain but that it is improving. She does not like the food she is getting, but is able to tolerate a diet. She is having stool output from her ostomy. She denies n/v.  UOP good. VSS.   Objective: Vital signs in last 24 hours: Temp:  [98.4 F (36.9 C)-99.2 F (37.3 C)] 98.7 F (37.1 C) (11/18 0522) Pulse Rate:  [64-74] 74 (11/18 0522) Resp:  [16-18] 16 (11/18 0522) BP: (86-115)/(41-58) 115/41 (11/18 0522) SpO2:  [92 %-99 %] 92 % (11/18 0522) Weight:  [79.4 kg (175 lb 0.7 oz)] 79.4 kg (175 lb 0.7 oz) (11/18 0522) Last BM Date: 09/29/17(thru ileostomy)  Intake/Output from previous day: 11/17 0701 - 11/18 0700 In: 180 [P.O.:180] Out: 700 [Urine:350; Stool:350] Intake/Output this shift: No intake/output data recorded.  PE: Gen:  Alert, NAD, pleasant Card:  Regular rate and rhythm Pulm:  Normal effort, clear to auscultation bilaterally Abd: Soft, mildly tender along midline incision and left side, non-distended, bowel sounds present, incisions C/D/I; stoma is pink with some liquid stool and gas Skin: warm and dry, no rashes  Psych: A&Ox3   Lab Results:  Recent Labs    09/28/17 0454  WBC 16.8*  HGB 9.0*  HCT 28.1*  PLT 137*   BMET Recent Labs    09/28/17 1850 09/29/17 0524  NA 129* 130*  K 4.7 4.5  CL 93* 92*  CO2 29 31  GLUCOSE 124* 88  BUN 20 20  CREATININE 0.87 0.94  CALCIUM 8.0* 7.8*   PT/INR No results for input(s): LABPROT, INR in the last 72 hours. CMP     Component Value Date/Time   NA 130 (L) 09/29/2017 0524   K 4.5 09/29/2017 0524   CL 92 (L) 09/29/2017 0524   CO2 31 09/29/2017 0524   GLUCOSE 88 09/29/2017 0524   BUN 20 09/29/2017 0524   CREATININE 0.94 09/29/2017 0524   CALCIUM 7.8 (L) 09/29/2017 0524   PROT 4.8 (L) 09/27/2017 0419   ALBUMIN 2.2 (L) 09/27/2017 0419   AST 16 09/27/2017 0419   ALT 12 (L)  09/27/2017 0419   ALKPHOS 55 09/27/2017 0419   BILITOT 0.5 09/27/2017 0419   GFRNONAA 54 (L) 09/29/2017 0524   GFRAA >60 09/29/2017 0524   Lipase     Component Value Date/Time   LIPASE 17 09/16/2017 1507       Studies/Results: Dg Abd Portable 1v  Result Date: 09/30/2017 CLINICAL DATA:  Acute onset of generalized abdominal pain. EXAM: PORTABLE ABDOMEN - 1 VIEW COMPARISON:  Abdominal radiograph performed 09/23/2017 FINDINGS: There appears to be significant free intra-abdominal air projecting over the right side of the abdomen. This may simply be postoperative in nature, though would correlate clinically for symptoms of perforation. Skin staples are noted along the left abdominal wall. The visualized bowel gas pattern is grossly unremarkable, with air noted in the colon. The visualized osseous structures are within normal limits; the sacroiliac joints are unremarkable in appearance. IMPRESSION: Significant free intra-abdominal air noted projecting over the right side of the abdomen. This may simply be postoperative in nature, though would correlate clinically for symptoms of perforation, and consider further imaging if deemed clinically appropriate. These results were called by telephone at the time of interpretation on 09/30/2017 at 12:58 am to [facility] on [facility], who verbally acknowledged these results. Electronically Signed   By: Francoise Schaumann.D.  On: 09/30/2017 01:00    Anti-infectives: Anti-infectives (From admission, onward)   Start     Dose/Rate Route Frequency Ordered Stop   09/24/17 1030  cefoTEtan in Dextrose 5% (CEFOTAN) 2-2.08 GM-% IVPB  Status:  Discontinued    Comments:  Hogue, Samantha   : cabinet override      09/24/17 1030 09/24/17 1036   09/24/17 1023  dextrose 5 % with cefOXitin (MEFOXIN) ADS Med  Status:  Discontinued    Comments:  Hogue, Samantha   : cabinet override      09/24/17 1023 09/24/17 1033   09/24/17 0800  cefoTEtan (CEFOTAN) 1 g in dextrose 5 % 50 mL  IVPB     1 g 100 mL/hr over 30 Minutes Intravenous To Short Stay 09/23/17 2310 09/24/17 1149       Assessment/Plan Recurrent small bowel obstruction/presumedCrohn's disease with inflammation of the distal terminal ileum/foreign object distal terminal ileum. Metastatic adenocarcinoma with peritoneal carcinomatosis - SB primary - pathology report discussed with patient. Copy left at bedside for the family members S/P exploratory laparotomy/ ileocecectomy with end ileostomy, 09/24/17, Dr. Donnie Mesa  Malnutrition - Prealbumin 11.3: PICC line/TNA starting11/9/18per Dr. Tana Coast  Fluid overload  History of GERD/duodenal ulcers- Protonix Mild Renal insufficiency- improving Reported mild aortic stenosis. Hypertension Hypothyroid-synthroid Remote history of rheumatic heart disease.  History of tobacco use History of TIA. Legally blind -cannot see to do ostomy care alone.  KPQ:AESL diet; magnesium replaced yesterday ID:Pre op only  DVT: SCD/Lovenox  Plan: Will need HH PT and OT. Will need [place] teaching for family to help with ostomy care. Tolerating diet. Abdominal film with free air noted yesterday, but pain is improving and patient is clinically improving. Will recheck CBC. As long as WBC trending down, patient remains afebrile, and clinically patient is improving then stable for discharge home from a surgical standpoint.      LOS: 14 days    Brigid Re , [facility] 09/30/2017, 8:07 AM Pager: [phone] Consults: [phone] Mon-Fri 7:00 am-4:30 pm Sat-Sun 7:00 am-11:30 am

## 2017-10-01 ENCOUNTER — Telehealth: Payer: Self-pay | Admitting: Cardiology

## 2017-10-01 NOTE — Telephone Encounter (Signed)
Clso[place] Encounter

## 2017-10-02 ENCOUNTER — Telehealth: Payer: Self-pay | Admitting: Emergency Medicine

## 2017-10-02 NOTE — Telephone Encounter (Signed)
Received call from [place] on 6N, patient was discharged from their unit on Sunday 09/30/2017.  Daughter Lidia Collum @ [phone] had called to unit verbalizing that they had received very "poor discharge instructions" and did not get needed prescriptions specifically Percocet and Zofran.  This info called to [facility] office who will ask MD to call patient/daughter.

## 2017-10-09 ENCOUNTER — Ambulatory Visit (HOSPITAL_BASED_OUTPATIENT_CLINIC_OR_DEPARTMENT_OTHER): Payer: Medicare Other | Admitting: Oncology

## 2017-10-09 ENCOUNTER — Telehealth: Payer: Self-pay | Admitting: Oncology

## 2017-10-09 VITALS — BP 127/81 | HR 108 | Temp 98.0°F | Resp 20 | Ht 62.0 in | Wt 148.1 lb

## 2017-10-09 DIAGNOSIS — C172 Malignant neoplasm of ileum: Secondary | ICD-10-CM

## 2017-10-09 DIAGNOSIS — K5090 Crohn's disease, unspecified, without complications: Secondary | ICD-10-CM | POA: Diagnosis not present

## 2017-10-09 DIAGNOSIS — C786 Secondary malignant neoplasm of retroperitoneum and peritoneum: Secondary | ICD-10-CM | POA: Diagnosis not present

## 2017-10-09 DIAGNOSIS — E039 Hypothyroidism, unspecified: Secondary | ICD-10-CM

## 2017-10-09 DIAGNOSIS — C762 Malignant neoplasm of abdomen: Secondary | ICD-10-CM

## 2017-10-09 NOTE — Telephone Encounter (Signed)
Scheduled appt per 11/27 los - Gave patient AVS and calender per los.  

## 2017-10-09 NOTE — Progress Notes (Signed)
[name] Patient Consult   Referring MD: Terri [name] 83 y.o.  1931-07-19    Reason for Referral: Small bowel carcinoma   HPI: Terri [name] reports being diagnosed with Crohn's disease approximately 5 years ago.  She developed acute onset nausea/vomiting in July and a CT abdomen/pelvis 05/31/2017 revealed multiple areas of wall thickening and stricturing at the ileum consistent with Crohn's disease.  There was evidence of a secondary small bowel obstruction.  She was admitted and symptoms improved with placement of an NG tube, bowel rest, and steroids.  She reports feeling well until she presented 09/16/2017 with recurrent nausea/vomiting.  A CT on 09/04/2017 revealed no liver lesions.  There was mural thickening of the distal and terminal ileum with enhancement of the associated mucosa.  A foreign body was noted in the lumen of the terminal ileum.  Multiple areas of soft tissue nodularity were noted throughout the omentum measuring up to 9 mm.  No ascites.  A repeat CT 09/16/2017 revealed multiple loops of dilated small bowel measuring up to 6 cm with a transition point at the terminal ileum.  Again demonstrated was a rectangular foreign body at the terminal ileum.  Lymphadenopathy.  Nodularity of omentum again noted.  Dr. Hildred [name] was consulted and she was taken to the operating room on 09/24/2017 for an exploratory laparotomy, omentectomy, and resection of the ileum and cecum.  Peritoneal implants were noted over the small bowel and colon mesentery.  A tight stricture was noted at the terminal ileum with associated peritoneal implants.  A foreign body was noted at the terminal ileum.  Frozen section of peritoneal implants confirmed adenocarcinoma. A firm mass was noted at the terminal ileum.  An omentectomy of the right colon and ileum were resected and an end ileostomy was created.  The pathology (Terri [name]) revealed invasive moderate to poorly  differentiated adenocarcinoma of the terminal ileum.  Tumor invaded through the serosa.  Mesenteric and serosal tumor deposits were noted.  Metastatic carcinoma was identified in 1 of 6 lymph nodes.  There were multiple satellite nodules.  A foreign body was noted at the terminal ileum.  Metastatic adenocarcinoma involved the omentum and a peritoneal implant biopsy.  Lymphovascular invasion was present.  The resection margins returned negative.  She developed intraoperative atrial fibrillation.  She was discharged from the hospital on 09/30/2017.  She saw Terri [name] yesterday.  The abdominal incision was opened at the upper and lower aspects and old hematoma was drained from the lower aspect of the incision.  Staples were removed and Steri-Strips were placed.  The wound was packed.  She was placed on Keflex.  She is here with multiple family members today.  They report she empties the ostomy bag every 1-2 hours.  There has been difficulty with adhesion of ostomy appliance.    Past Medical History:  Diagnosis Date  . Age-related macular degeneration, dry, both eyes   . Aortic insufficiency   . Aortic stenosis   . Arthritis    "hands" (05/31/2017)  . Asthma   . Basal cell carcinoma of nose   . Crohn's disease (Terri [name])   . GERD (gastroesophageal reflux disease)   . History of duodenal ulcer   . History of stomach ulcers   . HOH (hard of hearing)   . Hx of rheumatic fever   . Hypertension   . Hypothyroidism   . 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 Rheumatic heart disease   . Small bowel obstruction (  Terri [name]) 05/31/2017  . Squamous cell cancer of skin of nose   .  G3P3   . TIA (transient ischemic attack) 1999?     Marland Kitchen  Mnire's disease    .  History of typhoid fever    .  History of a DVT following knee surgery  Past Surgical History:  Procedure Laterality Date  . BASAL CELL CARCINOMA EXCISION  X 2   "face"  . CARPAL TUNNEL RELEASE Right   . CATARACT EXTRACTION W/ INTRAOCULAR LENS IMPLANT  Bilateral   . CHOLECYSTECTOMY    . ILEOCECETOMY N/A 09/24/2017   Procedure: Terri [name];  Surgeon: Terri Mesa, MD;  Location: [facility];  Service: General;  Laterality: N/A;  . ILEOSTOMY N/A 09/24/2017   Procedure: ILEOSTOMY;  Surgeon: Terri Mesa, MD;  Location: [name];  Service: General;  Laterality: N/A;  . LAPAROTOMY N/A 09/24/2017   Procedure: EXPLORATORY LAPAROTOMY;  Surgeon: Terri Mesa, MD;  Location: [name];  Service: General;  Laterality: N/A;  . OMENTECTOMY N/A 09/24/2017   Procedure: OMENTECTOMY;  Surgeon: Terri Mesa, MD;  Location: [name];  Service: General;  Laterality: N/A;  . SQUAMOUS CELL CARCINOMA EXCISION  X1   "face"  . TONSILLECTOMY      .  Left knee meniscus surgery   .  Rhinoplasty  Medications: Reviewed  Allergies:  Allergies  Allergen Reactions  . Lidocaine     Claims that it made her heart beat go irregular    Family history: Her daughter had breast cancer.  A brother has noninvasive bladder cancer.  Social History:   She lives with her son in [place].  She is a retired [employer] scientist.  She quit smoking cigarettes in her 71s.  She reports social alcohol use.  No risk factor for HIV or hepatitis.  No transfusion history.  ROS:   Positives include: Postprandial gas and "bloating "prior to surgery, history of urinary tract infections, mild dyspnea following surgery  A complete ROS was otherwise negative.  Physical Exam:  Blood pressure 127/81, pulse (!) 108, temperature 98 F (36.7 C), temperature source Oral, resp. rate 20, height 5' 2"  (1.575 m), weight 148 lb 1.6 oz (67.2 kg), SpO2 99 %.  HEENT: Upper denture plate, oropharynx without visible mass, neck without mass Lungs: Clear bilaterally Cardiac: Regular rate and rhythm Abdomen: No hepatosplenomegaly, right lower quadrant ileostomy with liquid stool, midline wound with an opening at the upper and lower aspect with packing in place.  No surrounding erythema.  Vascular: No leg edema Lymph  nodes: No cervical, supraclavicular, axillary, or inguinal nodes Neurologic: Alert and oriented, the motor exam appears intact in the upper and lower extremities Skin: No rash Musculoskeletal: No spine tenderness   LAB:  CBC  Lab Results  Component Value Date   WBC 17.5 (H) 09/30/2017   WBC DUPL SEE [id number] 09/30/2017   HGB 9.3 (L) 09/30/2017   HGB DUPL SEE [id number] 09/30/2017   HCT 29.0 (L) 09/30/2017   HCT DUPL SEE [id number] 09/30/2017   MCV 86.8 09/30/2017   MCV DUPL SEE [id number] 09/30/2017   PLT 167 09/30/2017   PLT DUPL SEE [id number] 09/30/2017   NEUTROABS 12.5 (H) 09/30/2017      Imaging: As per HPI-CT images from 09/16/2017 reviewed   Assessment/Plan:   1. 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 Crohn's disease 3. Macular degeneration 4. Aortic insufficiency 5. History of a TIA 6. History of a positive PPD 7. History of a DVT following knee meniscus surgery 8. History of "typhoid "fever 9. Hypothyroid   Disposition:   Ms. Maves has been diagnosed with metastatic small bowel carcinoma.  I discussed the prognosis and treatment options with Ms. Brand and multiple family members.  She has metastatic small bowel carcinoma and no therapy will be curative.  We discussed observation/supportive care versus a trial of systemic therapy.  She understands there is no clear "standard "best systemic therapy for patients with metastatic small bowel carcinoma.  We extrapolate from the colorectal cancer literature when treating patients with small bowel carcinoma.  We discussed 5-fluorouracil and oxaliplatin based therapies.  I reviewed the  toxicities associated with these agents.  She indicated that she would like to proceed with a trial of chemotherapy as opposed to comfort care.  She continues to recover from surgery.  We will submit the resected tumor for Foundation 1 and MSI testing.  She will return for an office visit and further discussion of treatment options in 2 weeks.  She has significant output from the ileostomy.  She met with an ostomy nurse today.  She will begin Imodium in an attempt to decrease the ostomy output.  Ms. Molino is scheduled to see Dr. Hildred [name] later this week for evaluation of the abdominal wound.  60 minutes were spent with the patient today.  The majority of the time was used for counseling and coordination of care.  Betsy Coder, MD  10/09/2017, 2:24 PM

## 2017-10-09 NOTE — Progress Notes (Signed)
  Oncology Nurse Navigator Documentation  Navigator Location: [facility] (10/09/17 1613) Referral date to RadOnc/MedOnc: 09/27/17 (10/09/17 1613) )Navigator Encounter Type: Initial MedOnc (10/09/17 1613)   Abnormal Finding Date: 09/17/17 (10/09/17 1613) Confirmed Diagnosis Date: 09/24/17 (10/09/17 1613) Surgery Date: 09/24/17 (10/09/17 1613)           Treatment Initiated Date: 09/24/17 (10/09/17 1613) Patient Visit Type: MedOnc;Initial (10/09/17 1613) Treatment Phase: Pre-Tx/Tx Discussion (10/09/17 1613) Barriers/Navigation Needs: Education (10/09/17 1613)   Interventions: Psycho-social support;Education (10/09/17 1613)  I met with Dr. Benay Spice, patient and six family members during ms. Berkowitz's initial med/onc appointment. Patient provided with my contact information and verbalized understanding that she can call with questions or concerns.   Education Method: Verbal (10/09/17 1613)      Acuity: Level 2 (10/09/17 1613)   Acuity Level 2: Initial guidance, education and coordination as needed;Educational needs;Ongoing guidance and education throughout treatment as needed (10/09/17 1613)     Time Spent with Patient: 45 (10/09/17 1613)

## 2017-10-15 ENCOUNTER — Telehealth: Payer: Self-pay | Admitting: *Deleted

## 2017-10-15 NOTE — Telephone Encounter (Signed)
Called [facility]. Requested IHC for mismatch proteins and Foundation One testing on [id number].

## 2017-10-16 ENCOUNTER — Telehealth: Payer: Self-pay | Admitting: *Deleted

## 2017-10-16 NOTE — Telephone Encounter (Signed)
Message from [place] at [facility] requesting orders for ostomy supplies. Returned call, left message to inform her surgeons are usually the ordering providers for stoma supplies.

## 2017-10-23 ENCOUNTER — Telehealth: Payer: Self-pay | Admitting: *Deleted

## 2017-10-23 NOTE — Telephone Encounter (Signed)
Message from [place] with [facility] reporting pt has had a significant decline over the weekend. Family is unsure whether she will take chemo or not. They declined palliative visit until they see MD on 12/12. Tammy will check in with pt/ family after oncology visit.

## 2017-10-24 ENCOUNTER — Inpatient Hospital Stay (HOSPITAL_COMMUNITY): Payer: Medicare Other

## 2017-10-24 ENCOUNTER — Inpatient Hospital Stay (HOSPITAL_COMMUNITY)
Admission: AD | Admit: 2017-10-24 | Discharge: 2017-11-03 | DRG: 682 | Disposition: A | Discharge status: Home Health | Payer: Medicare Other | Source: Ambulatory Visit | Attending: Internal Medicine | Admitting: Internal Medicine

## 2017-10-24 ENCOUNTER — Ambulatory Visit (HOSPITAL_BASED_OUTPATIENT_CLINIC_OR_DEPARTMENT_OTHER): Payer: Medicare Other

## 2017-10-24 ENCOUNTER — Ambulatory Visit (HOSPITAL_BASED_OUTPATIENT_CLINIC_OR_DEPARTMENT_OTHER): Payer: Medicare Other | Admitting: Oncology

## 2017-10-24 ENCOUNTER — Encounter (HOSPITAL_COMMUNITY): Payer: Self-pay | Admitting: *Deleted

## 2017-10-24 ENCOUNTER — Encounter: Payer: Self-pay | Admitting: Oncology

## 2017-10-24 ENCOUNTER — Other Ambulatory Visit: Payer: Self-pay

## 2017-10-24 VITALS — BP 109/66 | HR 95 | Temp 97.7°F | Resp 18 | Ht 62.0 in | Wt 141.9 lb

## 2017-10-24 DIAGNOSIS — Z87891 Personal history of nicotine dependence: Secondary | ICD-10-CM

## 2017-10-24 DIAGNOSIS — I700 Atherosclerosis of aorta: Secondary | ICD-10-CM | POA: Diagnosis present

## 2017-10-24 DIAGNOSIS — E875 Hyperkalemia: Secondary | ICD-10-CM | POA: Diagnosis present

## 2017-10-24 DIAGNOSIS — R05 Cough: Secondary | ICD-10-CM

## 2017-10-24 DIAGNOSIS — C784 Secondary malignant neoplasm of small intestine: Secondary | ICD-10-CM | POA: Diagnosis present

## 2017-10-24 DIAGNOSIS — C786 Secondary malignant neoplasm of retroperitoneum and peritoneum: Secondary | ICD-10-CM | POA: Diagnosis present

## 2017-10-24 DIAGNOSIS — N170 Acute kidney failure with tubular necrosis: Principal | ICD-10-CM | POA: Diagnosis present

## 2017-10-24 DIAGNOSIS — E860 Dehydration: Secondary | ICD-10-CM

## 2017-10-24 DIAGNOSIS — Z66 Do not resuscitate: Secondary | ICD-10-CM | POA: Diagnosis present

## 2017-10-24 DIAGNOSIS — J069 Acute upper respiratory infection, unspecified: Secondary | ICD-10-CM | POA: Diagnosis present

## 2017-10-24 DIAGNOSIS — Z8711 Personal history of peptic ulcer disease: Secondary | ICD-10-CM

## 2017-10-24 DIAGNOSIS — Z85118 Personal history of other malignant neoplasm of bronchus and lung: Secondary | ICD-10-CM

## 2017-10-24 DIAGNOSIS — L8942 Pressure ulcer of contiguous site of back, buttock and hip, stage 2: Secondary | ICD-10-CM | POA: Diagnosis not present

## 2017-10-24 DIAGNOSIS — I4891 Unspecified atrial fibrillation: Secondary | ICD-10-CM | POA: Diagnosis present

## 2017-10-24 DIAGNOSIS — E039 Hypothyroidism, unspecified: Secondary | ICD-10-CM

## 2017-10-24 DIAGNOSIS — Z515 Encounter for palliative care: Secondary | ICD-10-CM | POA: Diagnosis not present

## 2017-10-24 DIAGNOSIS — E43 Unspecified severe protein-calorie malnutrition: Secondary | ICD-10-CM | POA: Diagnosis present

## 2017-10-24 DIAGNOSIS — H9190 Unspecified hearing loss, unspecified ear: Secondary | ICD-10-CM | POA: Diagnosis present

## 2017-10-24 DIAGNOSIS — C172 Malignant neoplasm of ileum: Secondary | ICD-10-CM | POA: Diagnosis not present

## 2017-10-24 DIAGNOSIS — L89152 Pressure ulcer of sacral region, stage 2: Secondary | ICD-10-CM | POA: Diagnosis present

## 2017-10-24 DIAGNOSIS — Z86718 Personal history of other venous thrombosis and embolism: Secondary | ICD-10-CM

## 2017-10-24 DIAGNOSIS — H548 Legal blindness, as defined in USA: Secondary | ICD-10-CM | POA: Diagnosis present

## 2017-10-24 DIAGNOSIS — Z8673 Personal history of transient ischemic attack (TIA), and cerebral infarction without residual deficits: Secondary | ICD-10-CM

## 2017-10-24 DIAGNOSIS — Z932 Ileostomy status: Secondary | ICD-10-CM

## 2017-10-24 DIAGNOSIS — R059 Cough, unspecified: Secondary | ICD-10-CM

## 2017-10-24 DIAGNOSIS — N179 Acute kidney failure, unspecified: Secondary | ICD-10-CM | POA: Diagnosis present

## 2017-10-24 DIAGNOSIS — E871 Hypo-osmolality and hyponatremia: Secondary | ICD-10-CM

## 2017-10-24 DIAGNOSIS — R198 Other specified symptoms and signs involving the digestive system and abdomen: Secondary | ICD-10-CM | POA: Diagnosis not present

## 2017-10-24 DIAGNOSIS — Z85828 Personal history of other malignant neoplasm of skin: Secondary | ICD-10-CM

## 2017-10-24 DIAGNOSIS — E440 Moderate protein-calorie malnutrition: Secondary | ICD-10-CM | POA: Diagnosis not present

## 2017-10-24 DIAGNOSIS — Z6826 Body mass index (BMI) 26.0-26.9, adult: Secondary | ICD-10-CM

## 2017-10-24 DIAGNOSIS — M19049 Primary osteoarthritis, unspecified hand: Secondary | ICD-10-CM | POA: Diagnosis present

## 2017-10-24 DIAGNOSIS — K5090 Crohn's disease, unspecified, without complications: Secondary | ICD-10-CM | POA: Diagnosis present

## 2017-10-24 DIAGNOSIS — C762 Malignant neoplasm of abdomen: Secondary | ICD-10-CM

## 2017-10-24 DIAGNOSIS — E872 Acidosis: Secondary | ICD-10-CM | POA: Diagnosis present

## 2017-10-24 DIAGNOSIS — I082 Rheumatic disorders of both aortic and tricuspid valves: Secondary | ICD-10-CM | POA: Diagnosis present

## 2017-10-24 DIAGNOSIS — K219 Gastro-esophageal reflux disease without esophagitis: Secondary | ICD-10-CM | POA: Diagnosis present

## 2017-10-24 DIAGNOSIS — H353130 Nonexudative age-related macular degeneration, bilateral, stage unspecified: Secondary | ICD-10-CM | POA: Diagnosis present

## 2017-10-24 DIAGNOSIS — Z79891 Long term (current) use of opiate analgesic: Secondary | ICD-10-CM

## 2017-10-24 DIAGNOSIS — L8990 Pressure ulcer of unspecified site, unspecified stage: Secondary | ICD-10-CM

## 2017-10-24 DIAGNOSIS — Z888 Allergy status to other drugs, medicaments and biological substances status: Secondary | ICD-10-CM

## 2017-10-24 DIAGNOSIS — C179 Malignant neoplasm of small intestine, unspecified: Secondary | ICD-10-CM | POA: Diagnosis not present

## 2017-10-24 DIAGNOSIS — Z7902 Long term (current) use of antithrombotics/antiplatelets: Secondary | ICD-10-CM

## 2017-10-24 DIAGNOSIS — J45909 Unspecified asthma, uncomplicated: Secondary | ICD-10-CM | POA: Diagnosis present

## 2017-10-24 DIAGNOSIS — Z79899 Other long term (current) drug therapy: Secondary | ICD-10-CM

## 2017-10-24 DIAGNOSIS — Z7952 Long term (current) use of systemic steroids: Secondary | ICD-10-CM

## 2017-10-24 DIAGNOSIS — I10 Essential (primary) hypertension: Secondary | ICD-10-CM | POA: Diagnosis present

## 2017-10-24 LAB — URINALYSIS, ROUTINE W REFLEX MICROSCOPIC
BILIRUBIN URINE: NEGATIVE
Glucose, UA: NEGATIVE mg/dL
HGB URINE DIPSTICK: NEGATIVE
Ketones, ur: NEGATIVE mg/dL
Nitrite: NEGATIVE
PROTEIN: NEGATIVE mg/dL
Specific Gravity, Urine: 1.013 (ref 1.005–1.030)
pH: 5 (ref 5.0–8.0)

## 2017-10-24 LAB — CBC WITH DIFFERENTIAL/PLATELET
BASO%: 0.2 % (ref 0.0–2.0)
Basophils Absolute: 0 10*3/uL (ref 0.0–0.1)
EOS%: 0.1 % (ref 0.0–7.0)
Eosinophils Absolute: 0 10*3/uL (ref 0.0–0.5)
HEMATOCRIT: 42.1 % (ref 34.8–46.6)
HEMOGLOBIN: 13.7 g/dL (ref 11.6–15.9)
LYMPH#: 2.2 10*3/uL (ref 0.9–3.3)
LYMPH%: 12.8 % — AB (ref 14.0–49.7)
MCH: 27.7 pg (ref 25.1–34.0)
MCHC: 32.6 g/dL (ref 31.5–36.0)
MCV: 85 fL (ref 79.5–101.0)
MONO#: 1 10*3/uL — AB (ref 0.1–0.9)
MONO%: 5.9 % (ref 0.0–14.0)
NEUT#: 14 10*3/uL — ABNORMAL HIGH (ref 1.5–6.5)
NEUT%: 81 % — ABNORMAL HIGH (ref 38.4–76.8)
PLATELETS: 312 10*3/uL (ref 145–400)
RBC: 4.95 10*6/uL (ref 3.70–5.45)
RDW: 15.5 % — AB (ref 11.2–14.5)
WBC: 17.3 10*3/uL — AB (ref 3.9–10.3)

## 2017-10-24 LAB — BASIC METABOLIC PANEL
Anion gap: 12 (ref 5–15)
Anion gap: 12 (ref 5–15)
BUN: 82 mg/dL — AB (ref 6–20)
BUN: 77 mg/dL — AB (ref 6–20)
CHLORIDE: 90 mmol/L — AB (ref 101–111)
CHLORIDE: 93 mmol/L — AB (ref 101–111)
CO2: 20 mmol/L — ABNORMAL LOW (ref 22–32)
CO2: 19 mmol/L — AB (ref 22–32)
CREATININE: 3.3 mg/dL — AB (ref 0.44–1.00)
Calcium: 8.5 mg/dL — ABNORMAL LOW (ref 8.9–10.3)
Calcium: 8.5 mg/dL — ABNORMAL LOW (ref 8.9–10.3)
Creatinine, Ser: 2.85 mg/dL — ABNORMAL HIGH (ref 0.44–1.00)
GFR calc Af Amer: 14 mL/min — ABNORMAL LOW (ref >60)
GFR calc Af Amer: 16 mL/min — ABNORMAL LOW (ref >60)
GFR calc non Af Amer: 12 mL/min — ABNORMAL LOW (ref >60)
GFR calc non Af Amer: 14 mL/min — ABNORMAL LOW (ref >60)
GLUCOSE: 128 mg/dL — AB (ref 65–99)
GLUCOSE: 123 mg/dL — AB (ref 65–99)
POTASSIUM: 5.3 mmol/L — AB (ref 3.5–5.1)
POTASSIUM: 4.9 mmol/L (ref 3.5–5.1)
Sodium: 122 mmol/L — ABNORMAL LOW (ref 135–145)
Sodium: 124 mmol/L — ABNORMAL LOW (ref 135–145)

## 2017-10-24 LAB — COMPREHENSIVE METABOLIC PANEL
ALT: 36 U/L (ref 0–55)
ANION GAP: 18 meq/L — AB (ref 3–11)
AST: 31 U/L (ref 5–34)
Albumin: 3 g/dL — ABNORMAL LOW (ref 3.5–5.0)
Alkaline Phosphatase: 217 U/L — ABNORMAL HIGH (ref 40–150)
BILIRUBIN TOTAL: 0.61 mg/dL (ref 0.20–1.20)
BUN: 83.4 mg/dL — ABNORMAL HIGH (ref 7.0–26.0)
CHLORIDE: 87 meq/L — AB (ref 98–109)
CO2: 16 meq/L — AB (ref 22–29)
Calcium: 10.1 mg/dL (ref 8.4–10.4)
Creatinine: 3.6 mg/dL (ref 0.6–1.1)
EGFR: 11 mL/min/{1.73_m2} — ABNORMAL LOW (ref >60)
GLUCOSE: 197 mg/dL — AB (ref 70–140)
Potassium: 5.8 mEq/L — ABNORMAL HIGH (ref 3.5–5.1)
Sodium: 120 mEq/L — ABNORMAL LOW (ref 136–145)
TOTAL PROTEIN: 8.3 g/dL (ref 6.4–8.3)

## 2017-10-24 LAB — OSMOLALITY, URINE: Osmolality, Ur: 367 mOsm/kg (ref 300–900)

## 2017-10-24 LAB — MRSA PCR SCREENING: MRSA BY PCR: NEGATIVE

## 2017-10-24 LAB — TECHNOLOGIST REVIEW

## 2017-10-24 LAB — TSH: TSH: 1.352 u[IU]/mL (ref 0.350–4.500)

## 2017-10-24 LAB — CEA (IN HOUSE-CHCC): CEA (CHCC-In House): 6.49 ng/mL — ABNORMAL HIGH (ref 0.00–5.00)

## 2017-10-24 LAB — OSMOLALITY: OSMOLALITY: 288 mosm/kg (ref 275–295)

## 2017-10-24 MED ORDER — ACETAMINOPHEN 325 MG PO TABS: 650.0000 mg | ORAL_TABLET | Freq: Four times a day (QID) | ORAL | Status: DC | PRN

## 2017-10-24 MED ORDER — OXYBUTYNIN CHLORIDE 5 MG PO TABS
5.0000 mg | ORAL_TABLET | Freq: Every day | ORAL | Status: DC
  Administered 2017-10-24 – 2017-11-03 (×11): 5 mg via ORAL
  Filled 2017-10-24 (×11): qty 1

## 2017-10-24 MED ORDER — LEVOTHYROXINE SODIUM 100 MCG PO TABS
100.0000 ug | ORAL_TABLET | Freq: Every day | ORAL | Status: DC
  Administered 2017-10-25 – 2017-11-03 (×10): 100 ug via ORAL
  Filled 2017-10-24 (×10): qty 1

## 2017-10-24 MED ORDER — FOLIC ACID 1 MG PO TABS
1.0000 mg | ORAL_TABLET | Freq: Every day | ORAL | Status: DC
  Administered 2017-10-25 – 2017-11-03 (×10): 1 mg via ORAL
  Filled 2017-10-24 (×10): qty 1

## 2017-10-24 MED ORDER — HEPARIN SODIUM (PORCINE) 5000 UNIT/ML IJ SOLN
5000.0000 [IU] | Freq: Three times a day (TID) | INTRAMUSCULAR | Status: DC
  Administered 2017-10-24 – 2017-10-25 (×4): 5000 [IU] via SUBCUTANEOUS
  Filled 2017-10-24 (×4): qty 1

## 2017-10-24 MED ORDER — ONDANSETRON HCL 4 MG/2ML IJ SOLN
4.0000 mg | Freq: Four times a day (QID) | INTRAMUSCULAR | Status: DC | PRN
  Administered 2017-10-28 – 2017-11-02 (×3): 4 mg via INTRAVENOUS
  Filled 2017-10-24 (×3): qty 2

## 2017-10-24 MED ORDER — SODIUM CHLORIDE 0.9 % IV SOLN
INTRAVENOUS | Status: DC
  Administered 2017-10-24 – 2017-10-25 (×3): via INTRAVENOUS

## 2017-10-24 MED ORDER — SODIUM CHLORIDE 0.9 % IV SOLN
INTRAVENOUS | Status: DC
  Administered 2017-10-24: 14:00:00 via INTRAVENOUS

## 2017-10-24 MED ORDER — MESALAMINE ER 250 MG PO CPCR: 900.0000 mg | ORAL_CAPSULE | Freq: Two times a day (BID) | ORAL | Status: DC

## 2017-10-24 MED ORDER — PREDNISONE 10 MG PO TABS
10.0000 mg | ORAL_TABLET | Freq: Every day | ORAL | Status: DC
  Administered 2017-10-25 – 2017-11-03 (×10): 10 mg via ORAL
  Filled 2017-10-24 (×10): qty 1

## 2017-10-24 MED ORDER — LOPERAMIDE HCL 2 MG PO CAPS: 2.0000 mg | ORAL_CAPSULE | Freq: Three times a day (TID) | ORAL | Status: DC | PRN

## 2017-10-24 MED ORDER — OXYCODONE-ACETAMINOPHEN 5-325 MG PO TABS
1.0000 | ORAL_TABLET | ORAL | Status: DC | PRN
  Administered 2017-10-28: 1 via ORAL
  Filled 2017-10-24: qty 1

## 2017-10-24 MED ORDER — PANTOPRAZOLE SODIUM 40 MG PO TBEC
40.0000 mg | DELAYED_RELEASE_TABLET | ORAL | Status: DC
  Administered 2017-10-25 – 2017-10-29 (×5): 40 mg via ORAL
  Filled 2017-10-24 (×5): qty 1

## 2017-10-24 MED ORDER — MESALAMINE ER 250 MG PO CPCR
1500.0000 mg | ORAL_CAPSULE | Freq: Two times a day (BID) | ORAL | Status: DC
  Administered 2017-10-24 – 2017-11-03 (×20): 1500 mg via ORAL
  Filled 2017-10-24 (×22): qty 6

## 2017-10-24 MED ORDER — CLOPIDOGREL BISULFATE 75 MG PO TABS
75.0000 mg | ORAL_TABLET | Freq: Every day | ORAL | Status: DC
  Administered 2017-10-24 – 2017-11-03 (×11): 75 mg via ORAL
  Filled 2017-10-24 (×11): qty 1

## 2017-10-24 MED ORDER — POLYVINYL ALCOHOL 1.4 % OP SOLN: 1.0000 [drp] | Freq: Every day | OPHTHALMIC | Status: DC | PRN

## 2017-10-24 MED ORDER — ONDANSETRON HCL 4 MG PO TABS
4.0000 mg | ORAL_TABLET | Freq: Four times a day (QID) | ORAL | Status: DC | PRN
  Administered 2017-11-03: 4 mg via ORAL
  Filled 2017-10-24: qty 1

## 2017-10-24 MED ORDER — CARBOXYMETHYLCELLULOSE SODIUM 0.5 % OP SOLN: 1.0000 [drp] | Freq: Every day | OPHTHALMIC | Status: DC | PRN

## 2017-10-24 MED ORDER — ADULT MULTIVITAMIN W/MINERALS CH
1.0000 | ORAL_TABLET | Freq: Every day | ORAL | Status: DC
  Administered 2017-10-25 – 2017-11-03 (×10): 1 via ORAL
  Filled 2017-10-24 (×10): qty 1

## 2017-10-24 MED ORDER — ACETAMINOPHEN 650 MG RE SUPP
650.0000 mg | Freq: Four times a day (QID) | RECTAL | Status: DC | PRN
Start: 2017-10-24 — End: 2017-11-03

## 2017-10-24 NOTE — H&P (Signed)
History and Physical    Terri [name] YIF:[id number] DOB: 1931-03-15 DOA: 10/24/2017  PCP: Prince Solian, MD  Patient coming from:Home  Chief Complaint: weakness, decrease oral intake. Admitted from cancer center.   HPI: Terri [name] is a 83 y.o. 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  Patient came to oncology clinic as a regular follow-up.  Patient reported increased output from ileal conduit requiring every half an hour to 1 hour to empty.  She has poor oral intake associated with nausea, not feeling well and generalized weakness.  She was treated with oral azithromycin for a week for upper respiratory infection.  She completed antibiotics last week.  Still having some cough.  In the oncology clinic, patient was found to have BUN 80, creatinine of 3.6, serum sodium level of 120, potassium 5.8.  I have discussed with Dr.Sherril.  Recommended to admit the patient for medical management.  Patient denies headache, dizziness, chest pain or shortness of breath.  She has dry cough.  Denied fever or chills.  No abdominal pain.  Patient unable to say if she has any urinary symptoms however family concerned about UTI running in the family and wanted to check for UTI.  Patient's son and daughter-in-law at the bedside.  Review of Systems: As per HPI otherwise 10 point review of systems negative.    Past Medical History:  Diagnosis Date  . Age-related macular degeneration, dry, both eyes   . Aortic insufficiency   . Aortic stenosis   . Arthritis    "hands" (05/31/2017)  . Asthma   . Basal cell carcinoma of nose   . Crohn's disease ([place])   . GERD (gastroesophageal reflux disease)   . History of duodenal ulcer   . History of  stomach ulcers   . HOH (hard of hearing)   . Hx of rheumatic fever   . Hypertension   . Hypothyroidism   . Positive TB test   . Rheumatic heart disease   . Small bowel obstruction ([place]) 05/31/2017  . Squamous cell cancer of skin of nose   . TIA (transient ischemic attack)   . TIA (transient ischemic attack) 1999?    Past Surgical History:  Procedure Laterality Date  . BASAL CELL CARCINOMA EXCISION  X 2   "face"  . CARPAL TUNNEL RELEASE Right   . CATARACT EXTRACTION W/ INTRAOCULAR LENS IMPLANT Bilateral   . CHOLECYSTECTOMY    . ILEOCECETOMY N/A 09/24/2017   Procedure: Pennie Rushing;  Surgeon: Donnie Mesa, MD;  Location: [facility];  Service: General;  Laterality: N/A;  . ILEOSTOMY N/A 09/24/2017   Procedure: ILEOSTOMY;  Surgeon: Donnie Mesa, MD;  Location: [facility];  Service: General;  Laterality: N/A;  . LAPAROTOMY N/A 09/24/2017   Procedure: EXPLORATORY LAPAROTOMY;  Surgeon: Donnie Mesa, MD;  Location: [facility];  Service: General;  Laterality: N/A;  . OMENTECTOMY N/A 09/24/2017   Procedure: OMENTECTOMY;  Surgeon: Donnie Mesa, MD;  Location: [facility];  Service: General;  Laterality: N/A;  . SQUAMOUS CELL CARCINOMA EXCISION  X1   "face"  . TONSILLECTOMY      Social history: Reports that she quit smoking about 49 years ago. Her smoking use included cigarettes. She has a 1.50 pack-year smoking history. she has never used smokeless tobacco. She reports that she does not drink alcohol or use drugs.  Allergies  Allergen Reactions  . Lidocaine     Claims that it made her heart beat go irregular    Family History  Problem Relation Age of Onset  . Bladder Cancer Brother   . Breast cancer Daughter   . Crohn's disease Neg Hx      Prior to Admission medications   Medication Sig Start Date End Date Taking? Authorizing Provider  carboxymethylcellulose (REFRESH PLUS) 0.5 % SOLN Place 1 drop into both eyes daily as needed (dry eyes).    [provider]    clidinium-chlordiazePOXIDE (LIBRAX) 5-2.5 MG capsule Take 1 capsule daily by mouth.     [provider]  clopidogrel (PLAVIX) 75 MG tablet Take 75 mg by mouth daily. 04/26/17   [provider]  folic acid (FOLVITE) 1 MG tablet Take 1 mg by mouth daily.    [provider]  levothyroxine (SYNTHROID, LEVOTHROID) 100 MCG tablet Take 100 mcg by mouth daily before breakfast.    [provider]  Mesalamine (PENTASA PO) Take 900 mg by mouth 2 (two) times daily.    [provider]  Multiple Vitamin (MULTIVITAMIN WITH MINERALS) TABS tablet Take 1 tablet by mouth daily.    [provider]  ondansetron (ZOFRAN) 4 MG tablet Take 1 tablet (4 mg total) by mouth every 6 (six) hours as needed for nausea. 06/03/17   Nita Sells, MD  oxybutynin (DITROPAN) 5 MG tablet Take 5 mg by mouth daily.    [provider]  oxyCODONE-acetaminophen (PERCOCET/ROXICET) 5-325 MG tablet Take 1 tablet every 4 (four) hours as needed by mouth for moderate pain. 09/30/17   Mariel Aloe, MD  pantoprazole (PROTONIX) 40 MG tablet Take 40 mg by mouth every morning. 05/15/17   [provider]  Pediatric Multivitamins-Iron (MULTI-DELYN/IRON) LIQD Take 10 mLs by mouth daily. OTC iron    [provider]  predniSONE (DELTASONE) 10 MG tablet Take 10 mg daily with breakfast by mouth.    [provider]  sodium chloride (OCEAN) 0.65 % SOLN nasal spray Place 2 sprays as needed into both nostrils for congestion.    [provider]    Physical Exam: Vitals:   10/24/17 1500  BP: (!) 136/110  Pulse: 80  Resp: 19  SpO2: 96%      Constitutional: NAD, calm, comfortable Vitals:   10/24/17 1500  BP: (!) 136/110  Pulse: 80  Resp: 19  SpO2: 96%   Eyes: PERRL, lids and conjunctivae normal ENMT: Mucous membranes are dry. Normal dentition.  Neck: normal, supple Respiratory: Coarse breath sound bilateral, no wheezing.  Respiratory effort  normal. Cardiovascular: Regular rate and rhythm, S1S2 Normal. no murmurs / rubs / gallops. No extremity edema.   Abdomen: Soft, Non-tender, ileal conduit with a stool and fluid in the bag.  Not distended. Musculoskeletal: no clubbing / cyanosis. No joint deformity upper and lower extremities. Skin: no rashes, lesions, ulcers. No induration Neurologic: CN 2-12 grossly intact. Sensation intact.  Strength 5/5 in all 4.  Psychiatric: Normal judgment and insight. Alert and oriented x 3. Normal mood.    Labs on Admission: I have personally reviewed following labs and imaging studies  CBC: Recent Labs  Lab 10/24/17 1155  WBC 17.3*  NEUTROABS 14.0*  HGB 13.7  HCT 42.1  MCV 85.0  PLT 295   Basic Metabolic Panel: Recent Labs  Lab 10/24/17 1155  NA 120*  K 5.8*  CO2 16*  GLUCOSE 197*  BUN 83.4*  CREATININE 3.6*  CALCIUM 10.1  GFR: Estimated Creatinine Clearance: 10.2 mL/min (A) (by C-G formula based on SCr of 3.6 mg/dL [facility])). Liver Function Tests: Recent Labs  Lab 10/24/17 1155  AST 31  ALT 36  ALKPHOS 217*  BILITOT 0.61  PROT 8.3  ALBUMIN 3.0*   No results for input(s): LIPASE, AMYLASE in the last 168 hours. No results for input(s): AMMONIA in the last 168 hours. Coagulation Profile: No results for input(s): INR, PROTIME in the last 168 hours. Cardiac Enzymes: No results for input(s): CKTOTAL, CKMB, CKMBINDEX, TROPONINI in the last 168 hours. BNP (last 3 results) No results for input(s): PROBNP in the last 8760 hours. HbA1C: No results for input(s): HGBA1C in the last 72 hours. CBG: No results for input(s): GLUCAP in the last 168 hours. Lipid Profile: No results for input(s): CHOL, HDL, LDLCALC, TRIG, CHOLHDL, LDLDIRECT in the last 72 hours. Thyroid Function Tests: No results for input(s): TSH, T4TOTAL, FREET4, T3FREE, THYROIDAB in the last 72 hours. Anemia Panel: No results for input(s): VITAMINB12, FOLATE, FERRITIN, TIBC, IRON, RETICCTPCT in the last 72  hours. Urine analysis:    Component Value Date/Time   COLORURINE YELLOW 09/17/2017 0425   APPEARANCEUR CLEAR 09/17/2017 0425   LABSPEC 1.044 (H) 09/17/2017 0425   PHURINE 5.0 09/17/2017 0425   GLUCOSEU NEGATIVE 09/17/2017 0425   HGBUR NEGATIVE 09/17/2017 0425   BILIRUBINUR NEGATIVE 09/17/2017 0425   KETONESUR NEGATIVE 09/17/2017 0425   PROTEINUR NEGATIVE 09/17/2017 0425   NITRITE NEGATIVE 09/17/2017 0425   LEUKOCYTESUR NEGATIVE 09/17/2017 0425   Sepsis Labs: !!!!!!!!!!!!!!!!!!!!!!!!!!!!!!!!!!!!!!!!!!!! @LABRCNTIP (procalcitonin:4,lacticidven:4) ) Recent Results (from the past 240 hour(s))  TECHNOLOGIST REVIEW     Status: None   Collection Time: 10/24/17 11:55 AM  Result Value Ref Range Status   Technologist Review Rare metamyelocte   Final     Radiological Exams on Admission: No results found.  Assessment/Plan Active Problems:   AKI (acute kidney injury) ([place])   Dehydration   Hyponatremia  #Acute kidney injury likely ATN in the setting of increased ileostomy output causing severe dehydration: -Patient with acidosis and mild hyperkalemia. -Bolus one liter of normal saline and start continuous IV fluid. -Check UA, ultrasound kidneys -Strict ins and out to monitor urine output, monitor electrolytes, avoid nephrotoxins. -Ostomy nurse consult for the evaluation -Imodium as needed for the management of increased output.  #Hypovolemic hyponatremia likely chronic: Continue normal saline.  Check TSH, serum osmolality, urine electrolytes, urine osmolality.  Check BMP now and every 6 hours to avoid rapid correction.  Monitor BMP.  #Mild hyperkalemia in the setting of renal failure: On IV fluid.  Expect to improve after increase delivery of sodium to distal table.  Patient is stable.  Monitor labs.  #Dry cough with recent upper respiratory tract infection: Check chest x-ray as patient has no improvement in cough.  Hold off on antibiotics now.  Patient has chronic leukocytosis.  She  is afebrile  #History of small bowel carcinoma with ileal conduit now with increased output: Imodium as needed.  Abdomen is benign on exam and has no abdominal pain. -General surgery follow-up.  #History of hypothyroidism: Continue Synthroid.  Check TSH.  #History of TIA: Continue Plavix.  PT OT therapy.  #History of Crohn's disease: Patient is hemodynamically stable therefore continue oral prednisone.  Continue mesalamine.  DVT prophylaxis: Lovenox subcutaneous Code Status: Full code Family Communication: Discussed with the patient's son and daughter-in-law at bedside Disposition Plan: Stepdown unit Consults called: None, patient was sent by oncologist Admission status: Inpatient   Marketia Stallsmith Tanna Furry MD [facility] Pager [phone]  If 7PM-7AM, please contact night-coverage [email or web address] Password [email or web address]  10/24/2017, 3:25 PM

## 2017-10-24 NOTE — Progress Notes (Signed)
  Oncology Nurse Navigator Documentation  Navigator Location: [facility] (10/24/17 1401)   )Navigator Encounter Type: Follow-up Appt (10/24/17 1401)                     Patient Visit Type: MedOnc (10/24/17 1401) Treatment Phase: Pre-Tx/Tx Discussion (10/24/17 1401) Barriers/Navigation Needs: Family concerns(Patient being admitted inpatient due to dehydration) (10/24/17 1401)   Interventions: Psycho-social support;Other(Admitted for dehydration) (10/24/17 1401)  Patient arrived for med/onc appointment with Dr. Benay Spice. Patient has been sick for the last few days and has not been eating or drinking. Per Dr. Benay Spice. Patient is to be admitted to [facility].           Acuity: Level 2 (10/24/17 1401)   Acuity Level 2: Ongoing guidance and education throughout treatment as needed (10/24/17 1401)     Time Spent with Patient: 30 (10/24/17 1401)

## 2017-10-24 NOTE — Progress Notes (Signed)
Patient here for follow up. She has had an upper respiratory infection treated with Z Pac

## 2017-10-24 NOTE — Progress Notes (Signed)
[facility] OFFICE PROGRESS NOTE   Diagnosis: Small bowel carcinoma  INTERVAL HISTORY:   Terri [name] returns for a scheduled visit.  She is here with 2 family members.  They report her oral intake is limited.  She has high output from the ileostomy.  She empties the bag frequently, sometimes every 30 minutes to 1 hour.  She complains of a cough.  No fever. She reports she was prescribed azithromycin by her primary physician. She has experienced multiple falls with ecchymoses.  The ostomy wafer is leaking.  Objective:  Vital signs in last 24 hours:  Blood pressure 109/66, pulse 95, temperature 97.7 F (36.5 C), temperature source Oral, resp. rate 18, height 5\' 2"  (1.575 m), weight 141 lb 14.4 oz (64.4 kg), SpO2 100 %.    HEENT: The tongue is dry Resp: Scattered inspiratory/expiratory rhonchi and wheezes bilaterally, no respiratory distress Cardio: Regular rate and rhythm GI: No hepatomegaly, right lower quadrant ileostomy with liquid stool Vascular: No leg edema Neuro: Alert and oriented, moves all extremities to command Skin: Multiple ecchymoses over the arms and back, diminished skin turgor  Portacath/PICC-without erythema  Lab Results:  Lab Results  Component Value Date   WBC 17.3 (H) 10/24/2017   HGB 13.7 10/24/2017   HCT 42.1 10/24/2017   MCV 85.0 10/24/2017   PLT 312 10/24/2017   NEUTROABS 14.0 (H) 10/24/2017    CMP     Component Value Date/Time   NA 120 (L) 10/24/2017 1155   K 5.8 (H) 10/24/2017 1155   CL 94 (L) 09/30/2017 0830   CO2 16 (L) 10/24/2017 1155   GLUCOSE 197 (H) 10/24/2017 1155   BUN 83.4 (H) 10/24/2017 1155   CREATININE 3.6 (HH) 10/24/2017 1155   CALCIUM 10.1 10/24/2017 1155   PROT 8.3 10/24/2017 1155   ALBUMIN 3.0 (L) 10/24/2017 1155   AST 31 10/24/2017 1155   ALT 36 10/24/2017 1155   ALKPHOS 217 (H) 10/24/2017 1155   BILITOT 0.61 10/24/2017 1155   GFRNONAA 52 (L) 09/30/2017 0830   GFRAA >60 09/30/2017 0830    Lab  Results  Component Value Date   CEA1 6.49 (H) 10/24/2017    Lab Results  Component Value Date   INR 1.19 09/18/2017    Imaging:  No results found.  Medications: I have reviewed the patient's current medications.  Assessment/Plan: 1. 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 pT4, pN1, pM1 ? CT abdomen/pelvis 09/16/2017-high-grade small bowel obstruction with transition point at the terminal ileum, nodularity of the omentum ? Diffuse peritoneal implants noted at the time of exploratory laparotomy 09/24/2017  2. Crohn's disease 3. Macular degeneration 4. Aortic insufficiency 5. History of a TIA 6. History of a positive PPD 7. History of a DVT following knee meniscus surgery 8. History of "typhoid "fever 9. Hypothyroid 10. Severe dehydration, renal failure?  10/24/2017 11.  Cough/abnormal lung exam 10/24/2017-pneumonia?   Disposition:  Terri [name] has metastatic small bowel cancer.  She presents today for further discussion regarding treatment options.  She appears to have severe hydration, likely secondary to a high output ileostomy.  She may have renal failure.   She has a cough, dyspnea, and abnormal lung exam.  She was recently treated with azithromycin by her primary physician.  I contacted the hospitalist service and she will be admitted to a stepdown bed.  We will alert Dr. Georgette Dover of the admission.  Plans for treating the small  bowel carcinoma will be placed on hold until she has recovered from the acute presentation.  Outpatient follow-up will be scheduled at the Cancer center for the week of 10/29/2017.  Recommendations: 1.  Admit for intravenous hydration and management of electrolytes/renal failure 2.  Check chest x-ray 3.  Ostomy nurse consult 4.  Antidiarrhea medication for management of the high output ostomy  Betsy Coder, MD  10/24/2017  12:58  PM

## 2017-10-24 NOTE — Addendum Note (Signed)
Addended by: Brien Few on: 10/24/2017 03:01 PM   Modules accepted: Orders

## 2017-10-25 ENCOUNTER — Inpatient Hospital Stay (HOSPITAL_COMMUNITY): Payer: Medicare Other

## 2017-10-25 DIAGNOSIS — L8990 Pressure ulcer of unspecified site, unspecified stage: Secondary | ICD-10-CM

## 2017-10-25 DIAGNOSIS — L8942 Pressure ulcer of contiguous site of back, buttock and hip, stage 2: Secondary | ICD-10-CM

## 2017-10-25 DIAGNOSIS — E039 Hypothyroidism, unspecified: Secondary | ICD-10-CM

## 2017-10-25 DIAGNOSIS — R198 Other specified symptoms and signs involving the digestive system and abdomen: Secondary | ICD-10-CM

## 2017-10-25 DIAGNOSIS — C179 Malignant neoplasm of small intestine, unspecified: Secondary | ICD-10-CM

## 2017-10-25 DIAGNOSIS — Z932 Ileostomy status: Secondary | ICD-10-CM

## 2017-10-25 LAB — BASIC METABOLIC PANEL
ANION GAP: 11 (ref 5–15)
ANION GAP: 6 (ref 5–15)
Anion gap: 10 (ref 5–15)
BUN: 70 mg/dL — ABNORMAL HIGH (ref 6–20)
BUN: 64 mg/dL — ABNORMAL HIGH (ref 6–20)
BUN: 59 mg/dL — ABNORMAL HIGH (ref 6–20)
CALCIUM: 8.5 mg/dL — AB (ref 8.9–10.3)
CALCIUM: 8.7 mg/dL — AB (ref 8.9–10.3)
CALCIUM: 8.2 mg/dL — AB (ref 8.9–10.3)
CO2: 18 mmol/L — AB (ref 22–32)
CO2: 19 mmol/L — ABNORMAL LOW (ref 22–32)
CO2: 20 mmol/L — ABNORMAL LOW (ref 22–32)
CREATININE: 2.39 mg/dL — AB (ref 0.44–1.00)
CREATININE: 1.78 mg/dL — AB (ref 0.44–1.00)
Chloride: 97 mmol/L — ABNORMAL LOW (ref 101–111)
Chloride: 96 mmol/L — ABNORMAL LOW (ref 101–111)
Chloride: 100 mmol/L — ABNORMAL LOW (ref 101–111)
Creatinine, Ser: 2.17 mg/dL — ABNORMAL HIGH (ref 0.44–1.00)
GFR calc Af Amer: 29 mL/min — ABNORMAL LOW (ref >60)
GFR calc non Af Amer: 18 mL/min — ABNORMAL LOW (ref >60)
GFR, EST AFRICAN AMERICAN: 20 mL/min — AB (ref >60)
GFR, EST AFRICAN AMERICAN: 23 mL/min — AB (ref >60)
GFR, EST NON AFRICAN AMERICAN: 20 mL/min — AB (ref >60)
GFR, EST NON AFRICAN AMERICAN: 25 mL/min — AB (ref >60)
GLUCOSE: 143 mg/dL — AB (ref 65–99)
GLUCOSE: 129 mg/dL — AB (ref 65–99)
Glucose, Bld: 97 mg/dL (ref 65–99)
Potassium: 4.9 mmol/L (ref 3.5–5.1)
Potassium: 4.8 mmol/L (ref 3.5–5.1)
Potassium: 5 mmol/L (ref 3.5–5.1)
SODIUM: 125 mmol/L — AB (ref 135–145)
Sodium: 126 mmol/L — ABNORMAL LOW (ref 135–145)
Sodium: 126 mmol/L — ABNORMAL LOW (ref 135–145)

## 2017-10-25 LAB — CBC
HEMATOCRIT: 34.8 % — AB (ref 36.0–46.0)
Hemoglobin: 11.9 g/dL — ABNORMAL LOW (ref 12.0–15.0)
MCH: 28.4 pg (ref 26.0–34.0)
MCHC: 34.2 g/dL (ref 30.0–36.0)
MCV: 83.1 fL (ref 78.0–100.0)
Platelets: 245 10*3/uL (ref 150–400)
RBC: 4.19 MIL/uL (ref 3.87–5.11)
RDW: 14.8 % (ref 11.5–15.5)
WBC: 14.8 10*3/uL — ABNORMAL HIGH (ref 4.0–10.5)

## 2017-10-25 LAB — NA AND K (SODIUM & POTASSIUM), RAND UR: Potassium Urine: 39 mmol/L

## 2017-10-25 MED ORDER — LOPERAMIDE HCL 2 MG PO CAPS
2.0000 mg | ORAL_CAPSULE | Freq: Four times a day (QID) | ORAL | Status: DC | PRN
  Administered 2017-10-25 – 2017-10-28 (×6): 2 mg via ORAL
  Filled 2017-10-25 (×7): qty 1

## 2017-10-25 MED ORDER — PREMIER PROTEIN SHAKE
11.0000 [oz_av] | ORAL | Status: DC
  Administered 2017-10-27 – 2017-11-03 (×7): 11 [oz_av] via ORAL
  Filled 2017-10-25 (×2): qty 325.31

## 2017-10-25 MED ORDER — DIPHENOXYLATE-ATROPINE 2.5-0.025 MG PO TABS
2.0000 | ORAL_TABLET | Freq: Four times a day (QID) | ORAL | Status: DC
  Administered 2017-10-25 – 2017-11-03 (×36): 2 via ORAL
  Filled 2017-10-25 (×37): qty 2

## 2017-10-25 NOTE — Progress Notes (Signed)
  Oncology Nurse Navigator Documentation  Navigator Location: [facility] (10/25/17 1700)   )                      Patient Visit Type: Inpatient (10/25/17 1700)       Interventions: Psycho-social support (10/25/17 1700)  Met with patient on inpatient unit to offer emotional support. Patient verbalized difficulty with how sick she has become. Patient identified her family as a source of support. Patient encouraged to call with concerns or need for support.          Acuity: Level 2 (10/25/17 1700)   Acuity Level 2: Ongoing guidance and education throughout treatment as needed (10/25/17 1700)     Time Spent with Patient: 30 (10/25/17 1700)

## 2017-10-25 NOTE — Progress Notes (Signed)
   Subjective/Chief Complaint: S/p exploratory laparotomy, ileocecectomy with end ileostomy on 09/24/17 for SBO secondary to metastatic SB adenocarcinoma with peritoneal carcinomatosis.  Patient admitted yesterday for dehydration secondary to high ileostomy output.  She had been using some Imodium at home to slow down her output, but became dehydrated a few nights ago.    She has also had issues with leaking from the bag.  She feels better this morning.  Objective: Vital signs in last 24 hours: Temp:  [97.2 F (36.2 C)-97.7 F (36.5 C)] 97.6 F (36.4 C) (12/13 0800) Pulse Rate:  [65-96] 96 (12/13 0700) Resp:  [14-28] 23 (12/13 0700) BP: (97-138)/(37-110) 117/58 (12/13 0700) SpO2:  [94 %-100 %] 98 % (12/13 0700) Weight:  [64.4 kg (141 lb 14.4 oz)-65.5 kg (144 lb 6.4 oz)] 65.2 kg (143 lb 11.8 oz) (12/13 0357) Last BM Date: 10/24/17  Intake/Output from previous day: 12/12 0701 - 12/13 0700 In: 458.3 [I.V.:458.3] Out: 900 [Urine:250; Stool:650] Intake/Output this shift: No intake/output data recorded.  General appearance: alert, cooperative and no distress GI: soft, non-tender Ostomy pink - output shows some thickened consistency - not completely liquid Wound - contaminated by leaking stool from ileostomy Lab Results:  Recent Labs    10/24/17 1155 10/25/17 0454  WBC 17.3* 14.8*  HGB 13.7 11.9*  HCT 42.1 34.8*  PLT 312 245   BMET Recent Labs    10/24/17 2305 10/25/17 0454  NA 124* 125*  K 4.9 4.9  CL 93* 97*  CO2 19* 18*  GLUCOSE 123* 97  BUN 77* 70*  CREATININE 2.85* 2.39*  CALCIUM 8.5* 8.5*   PT/INR No results for input(s): LABPROT, INR in the last 72 hours. ABG No results for input(s): PHART, HCO3 in the last 72 hours.  Invalid input(s): PCO2, PO2  Studies/Results: US Renal  Result Date: 10/25/2017 CLINICAL DATA:  Acute kidney injury EXAM: RENAL / URINARY TRACT ULTRASOUND COMPLETE COMPARISON:  CT 09/16/2017 FINDINGS: Right Kidney: Length: 8.7 cm.  Echogenicity within normal limits. No mass or hydronephrosis visualized. Left Kidney: Length: 7.8 cm. Echogenicity within normal limits. No mass or hydronephrosis visualized. Bladder: Appears normal for degree of bladder distention. IMPRESSION: Mild atrophy bilaterally.  No acute findings.  No hydronephrosis. Electronically Signed   By: Rolm Baptise M.D.   On: 10/25/2017 09:41   Portable Chest 1 View  Result Date: 10/24/2017 CLINICAL DATA:  Persistent cough.  Chest congestion. EXAM: PORTABLE CHEST 1 VIEW COMPARISON:  09/30/2017 and 09/25/2017 and 09/16/2017 FINDINGS: The heart size and mediastinal contours are within normal limits. Both lungs are clear. The visualized skeletal structures are unremarkable. IMPRESSION: Normal exam. Electronically Signed   By: Lorriane Shire M.D.   On: 10/24/2017 16:31    Anti-infectives: Anti-infectives (From admission, onward)   None      Assessment/Plan: S/P exploratory laparotomy/ ileocecectomy with end ileostomy,09/24/17, Dr. Donnie Mesa Metastatic SB adenocarcinoma with peritoneal carcinomatosis   High ileostomy output - increase Imodium to q6 PRN If this does not help thicken and slow down the output, consider adding Paregoric WOCN consult to help with ostomy appliance Keep wound clean Will follow.  LOS: 1 day    Maia Petties 10/25/2017

## 2017-10-25 NOTE — Consult Note (Signed)
[facility]:   Pt is an active pt with [facility]- a home based Palliative Care Program provided by [facility]. Please contact us on discharge so that we will be able to coordinate care. We proved a nurse and SW with our services.  Contact number is [facility] Liaison [place] RN [phone]

## 2017-10-25 NOTE — Progress Notes (Signed)
Initial Nutrition Assessment  DOCUMENTATION CODES:   (Will need to assess for malnutrition at follow-up)  INTERVENTION:  - Will order daily multivitamin with minerals.  - Will trial Premier Protein BID, each supplement provides 160 kcal and 30 grams of protein.  - Will order yogurt for 2 PM snack every day. - Continue to encourage intakes of meals, supplement, and beverages throughout the day. - Talked with pt about the importance of fluid intake and will need to further discuss how to increase fluid intakes after d/c.   NUTRITION DIAGNOSIS:   Unintentional weight loss related to acute illness, post-op healing, other (see comment)(high output ileostomy) as evidenced by percent weight loss.  GOAL:   Patient will meet greater than or equal to 90% of their needs  MONITOR:   PO intake, Supplement acceptance, Weight trends, Labs, I & O's  REASON FOR ASSESSMENT:   Malnutrition Screening Tool, Consult Poor PO  ASSESSMENT:   83 y.o. 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  Patient came to oncology clinic as a regular follow-up.  Patient reported increased output from ileal conduit requiring every half an hour to 1 hour to empty.  She has poor oral intake associated with nausea, not feeling well and generalized weakness.  She was treated with oral azithromycin for a week for upper respiratory infection.  She completed antibiotics last week.  Still having some cough.  BMI indicates overweight, appropriate for age. No intakes documented since admission. Pt reports eating a pancake and banana for breakfast without issue. She states that since surgery ~1 month ago she has had decreased appetite but that family members continuously encourage  her to eat. This sometimes causes stress and anxiousness which can lead her to have stomach upset and further decreased desire to eat. She states abdominal pain/pressure when she tries to eat eggplant, broccoli, and cauliflower; explained rationale for this. She states that last Friday (12/7) she had an episode of coughing all night which she describes as a burning cough. PCP prescribed z-pack and inhaler x5 days and pt reports nausea during that time. She states after the 5 days, this resolved so she feels it was d/t medication.   At home, pt was mainly drinking Pedialyte and hot tea; she was drinking 6-7 glasses (8 ounce glasses) of these combined. Talked with pt about the need to increase fluid intake d/t ileostomy output. She states that prior to surgery last month, she had been receiving conflicting information from individuals concerning what she should and should not eat d/t Crohn's so she met with an RD and felt that this was extremely helpful. Her current concern is that her daughter told her that she read that pt should not be drinking soda d/t Crohn's hx and that she has been provided with ginger ale and Sprite during hospitalization. Informed pt of the rationale behind some individuals with Crohn's needing to limit or avoid soda and encouraged her that if she does choose to drink it, she needs to choose diet ginger ale or diet Sprite/Sprite Zero became of high sugar content of sodas.   Since surgery, she has been eating a yogurt each day and taking a probiotic. She reports being "slightly" lactose intolerant but states she can tolerate half-and-half, milk, and yogurt and has no problem with [place] ice cream but does  not feel well when she eats [place] ice cream; suspect that what she is describing is more related to the fat content rather than true lactose intolerance.   Per chart review, pt has lost 32 lbs (18% body weight) in the past 1 month. This is significant for time frame. Unable to  determine what percentage of this is d/t dehydration. Unable to state malnutrition without NFPE but suspect pt meets criteria for some degree of malnutrition.  Medications reviewed; 1 mg oral folic acid/day, 970 mcg oral Synthroid/day, 2 mg Imodium QID PRN, daily multivitamin with minerals, 40 mg oral Protonix/day, 10 mg Deltasone/day.  Labs reviewed; Na: 126 mmol/L, Cl: 96 mmol/L, BUN: 64 mg/dL, creatinine: 2.17 mg/dL, Ca: 8.7 mg/dL, GFR: 20 mL/min.  IVF: NS @ 125 mL/hr (3L/day).    NUTRITION - FOCUSED PHYSICAL EXAM:  Unable to complete/assess at this time d/t bag being off of ileostomy and RD not feeling comfortable touching pt d/t this.   Diet Order:  Diet regular Room service appropriate? Yes; Fluid consistency: Thin  EDUCATION NEEDS:   Education needs have been addressed  Skin:  Skin Assessment: Skin Integrity Issues: Skin Integrity Issues:: Stage II, Other (Comment) Stage II: coccyx Other: ileostomy with redness to surrounding area  Last BM:  12/13 via ileostomy  Height:   Ht Readings from Last 1 Encounters:  10/24/17 _0  (1.575 m)    Weight:   Wt Readings from Last 1 Encounters:  10/25/17 143 lb 11.8 oz (65.2 kg)    Ideal Body Weight:  50 kg  BMI:  Body mass index is 26.29 kg/m.  Estimated Nutritional Needs:   Kcal:  1630-1825 (25-28 kcal/kg)  Protein:  60-70 grams  Fluid:  ileostomy output + 1 L/day      Jarome Matin, MS, RD, LDN, [facility] Inpatient Clinical Dietitian Pager # [phone] After hours/weekend pager # [phone]

## 2017-10-25 NOTE — Consult Note (Addendum)
[facility] Nurse ostomy follow up Patient visit to determine if ostomy pouching system applied at 12:45pm was still intact and it is.  Stool draining into pouch. Supplies at bedside.  Patient expressed appreciation for [facility] Nurse checking back in.  Patient will be seen by one of my partners tomorrow in my absence. There are no WOC Nursing services available over the weekend.  [facility] nursing team will not follow, and will remain available to this patient, the nursing, surgical and medical teams.  Thanks, Maudie Flakes, MSN, RN, [place], Arther Abbott  Pager# [phone]

## 2017-10-25 NOTE — Evaluation (Signed)
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 Pt s/p exploratory laparotomy, ileocecectomy with end ileostomy on 09/24/17 for SBO secondary to metastatic SB adenocarcinoma with peritoneal carcinomatosis.   Clinical Impression  Pt admitted with above diagnosis. Pt currently with functional limitations due to the deficits listed below (see PT Problem List).  Pt will benefit from skilled PT to increase their independence and safety with mobility to allow discharge to the venue listed below.  Pt assisted to [facility] and able to perform her own hygiene.  Pt then assisted with ambulating in hallway.  Pt reports she lives with family and is usually never left alone.     Follow Up Recommendations No PT follow up    Equipment Recommendations  None recommended by PT    Recommendations for Other Services       Precautions / Restrictions Precautions Precautions: Fall      Mobility  Bed Mobility               General bed mobility comments: pt up in recliner on arrival  Transfers Overall transfer level: Needs assistance Equipment used: None Transfers: Sit to/from Stand Sit to Stand: Min guard         General transfer comment: min/guard for safety with lines  Ambulation/Gait Ambulation/Gait assistance: Min guard Ambulation Distance (Feet): 140 Feet Assistive device: None Gait Pattern/deviations: Step-through pattern;Decreased stride length     General Gait Details: pt pushed IV pole, distance to tolerance, no unsteadiness observed  Stairs            Wheelchair  Mobility    Modified Rankin (Stroke Patients Only)       Balance                                             Pertinent Vitals/Pain Pain Assessment: No/denies pain    Home Living Family/patient expects to be discharged to:: Private residence Living Arrangements: Children(son and daughter in Sports coach) Available Help at Discharge: Family Type of Home: House Home Access: Stairs to enter Entrance Stairs-Rails: Right Entrance Stairs-Number of Steps: 4 Home Layout: One level Home Equipment: Environmental consultant - 4 wheels;Cane - single point;Bedside commode Additional Comments: Pt is a retired Naval architect Level of Independence: Independent         Comments: Pt is generally independent with her mobility.  She does have access to a RW and a cane     Hand Dominance        Extremity/Trunk Assessment        Lower Extremity Assessment Lower Extremity Assessment: Generalized weakness       Communication   Communication: No difficulties  Cognition Arousal/Alertness: Awake/alert Behavior During Therapy: WFL for tasks assessed/performed Overall Cognitive Status: Within Functional Limits for tasks assessed                                        General Comments      Exercises  Assessment/Plan    PT Assessment Patient needs continued PT services  PT Problem List Decreased strength;Decreased mobility;Decreased activity tolerance;Decreased knowledge of use of DME       PT Treatment Interventions DME instruction;Therapeutic activities;Gait training;Therapeutic exercise;Functional mobility training;Stair training;Patient/family education    PT Goals (Current goals can be found in the Care Plan section)  Acute Rehab PT Goals PT Goal Formulation: With patient Time For Goal Achievement: 11/08/17 Potential to Achieve Goals: Good    Frequency Min 3X/week   Barriers to discharge        Co-evaluation               AM-PAC PT "6  Clicks" Daily Activity  Outcome Measure Difficulty turning over in bed (including adjusting bedclothes, sheets and blankets)?: A Little Difficulty moving from lying on back to sitting on the side of the bed? : A Little Difficulty sitting down on and standing up from a chair with arms (e.g., wheelchair, bedside commode, etc,.)?: A Little Help needed moving to and from a bed to chair (including a wheelchair)?: A Little Help needed walking in hospital room?: A Little Help needed climbing 3-5 steps with a railing? : A Little 6 Click Score: 18    End of Session   Activity Tolerance: Patient tolerated treatment well Patient left: in chair;with call bell/phone within reach   PT Visit Diagnosis: Difficulty in walking, not elsewhere classified (R26.2)    Time: 1114-1130 PT Time Calculation (min) (ACUTE ONLY): 16 min   Charges:   PT Evaluation $PT Eval Low Complexity: 1 Low     PT G CodesCarmelia Bake, PT, DPT 10/25/2017 Pager: [phone]  York Ram E 10/25/2017, 12:57 PM

## 2017-10-25 NOTE — Consult Note (Addendum)
[facility] Nurse ostomy consult note  Stoma type/location: Ileostomy Stomal assessment/size: 7/8 inches x 1 and 1/8 inch oval, slightly raised, red, moist Peristomal assessment: moisture associated skin damage circumferentially with partial thickness skin loss. Treatment options for stomal/peristomal skin: stoma powder, convex pouch Output: seedy green effluent.  Previously was clear liquid  Ostomy pouching: 1pc.soft convex pouch applied as third attempt to manage effluent. No ring.  Belt.  Powder applied prior to pouching. No skin barrier film.  Education provided: Discussed with patient and family member at length what a great job they had been doing with pouching-despite the circumstances.  They have exhausted [place] and [employer] resources and have tried nearly every intervention in the industry to stop leakages and increase wear time to over 36 hours.  Note: 36 hours is the LONGEST they have ever kept a pouching system on, not the standard.  Wear time is 5 hours to 24 hours, usually. We discussed stoma revision and while patient would hate to have another procedure so close to her other surgery, I inform her that is is an option if things to not improve and would be something to discuss with her surgeon.  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 They are worried about depression and her becoming isolated in her home.  Note:  Last pouch applied at 12:45pm Enrolled patient in [place] program: Yes, previously  [place] nursing team will follow, and will remain available to this patient, the nursing, surgical and medical teams.   Thanks, Maudie Flakes, MSN, RN, [place], Arther Abbott  Pager# [phone]

## 2017-10-25 NOTE — Care Management Note (Signed)
Case Management Note  Patient Details  Name: Terri [name] MRN: [id number] Date of Birth: [birth year]  Subjective/Objective:                  aki  Action/Plan: Date: October 25, 2017 Velva Harman, BSN, [place], [place] Chart and notes review for patient progress and needs. Will follow for case management and discharge needs. Lives with adult children. Next review date: [id number]  Expected Discharge Date:  (unknown)               Expected Discharge Plan:  Home/Self Care  In-House Referral:     Discharge planning Services  CM Consult  Post Acute Care Choice:    Choice offered to:     DME Arranged:    DME Agency:     HH Arranged:    HH Agency:     Status of Service:  In process, will continue to follow  If discussed at Long Length of Stay Meetings, dates discussed:    Additional Comments:  Leeroy Cha, RN 10/25/2017, 8:23 AM

## 2017-10-25 NOTE — Progress Notes (Signed)
[facility]  Patient Status: Active (receiving services up to time of hospitalization)  [facility] is providing the following services: RN, OT and MSW  If patient discharges after hours, please call [phone].   Terri [name] 10/25/2017, 9:31 AM

## 2017-10-25 NOTE — Progress Notes (Signed)
[facility] PROGRESS NOTE  Terri [name] KYH:[id number] DOB: 11/08/1931 DOA: 10/24/2017 PCP: Prince Solian, MD  Interim summary and HPI 83 y.o. 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  Patient came to oncology clinic as a regular follow-up.  Patient reported increased output from ileal conduit requiring every half an hour to 1 hour to empty.  She has poor oral intake associated with nausea, not feeling well and generalized weakness.  She was treated with oral azithromycin for a week for upper respiratory infection.  She completed antibiotics last week.  Still having some cough.  In the oncology clinic, patient was found to have BUN 80, creatinine of 3.6, serum sodium level of 120, potassium 5.8.  I have discussed with Dr.Sherril.  Recommended to admit the patient for medical management.   Assessment/Plan: 1-acute kidney injury: with presumed ATN. -improving with IVF's -continue avoiding nephrotoxic agents -follow renal function trend closely and replete electrolytes as needed   2-hypovolemic hyponatremia: -in setting of increase GI loses from ostomy -will continue IVF's -try to slow down ostomy output -follow electrolytes trend -patient denies any neurologic deficit  3-high output ileostomy -continue PRN imodium and add lomotil -follow electrolytes and replete them as needed  -wound care/ostomy service to guide regarding care  4-severe protein calorie malnutrition  -with over 32 pounds weight loss in the last month -nutritional service consulted -will follow rec's  5-hx of small bowel carcinoma  -s/p ileostomy -will follow CCS rec's from post surgical rec's -oncology also on board; will follow rec's  6-hx of  hypothyroidism -continue synthroid -TSH WNL  7-hx of TIA -no focal deficit -continue plavix for secondary prevention   8-hx of crohn's disease -continue mesalamine and prednisone   9-stage 2 pressure injury -continue preventive measures and constant repositioning -barrier creams to be added as needed.  Code Status: DNR Family Communication: no family at bedside  Disposition Plan: remains in stepdown; continue IVF's and electrolytes repletion. Will add lomotil. Follow CCS recommendations and wound care/ostomy service instructions.   Consultants:  Oncology  CCS  Procedures:  See below for x-ray reports   Antibiotics:  None   HPI/Subjective: Feeling slightly better; no CP, no SOB. Still with ongoing high output and loose stools from ileostomy. No nausea or vomiting currently.  Objective: Vitals:   10/25/17 1600 10/25/17 1926  BP: (!) 95/51   Pulse: 77   Resp: 17   Temp: 97.6 F (36.4 C) 97.6 F (36.4 C)  SpO2: 99%     Intake/Output Summary (Last 24 hours) at 10/25/2017 2120 Last data filed at 10/25/2017 1800 Gross per 24 hour  Intake 2875 ml  Output 1250 ml  Net 1625 ml   Filed Weights   10/24/17 1600 10/25/17 0357  Weight: 65.5 kg (144 lb 6.4 oz) 65.2 kg (143 lb 11.8 oz)    Exam:   General: afebrile, no CP, no nausea, no vomiting. Reports mild intermittent abd discomfort and reported continue to have high output through ileostomy.  Cardiovascular: S1 and S2, no rubs, no gallops, no JVD  Respiratory: good air movement, no wheezing, no crackles  Abdomen: soft, no distension; ileostomy in place, active loose stools coming out; patient with surrounding erythema around ileostomy (also reported problems with ostomy bag integrity)  Musculoskeletal: no edema, no cyanosis, no clubbing.  Skin: stage 2 pressure injury  in her coccyx area; no open wound or drainage; present on admission.  Data Reviewed: Basic Metabolic Panel: Recent Labs  Lab  10/24/17 1700 10/24/17 2305 10/25/17 0454 10/25/17 1109 10/25/17 1640  NA 122* 124* 125* 126* 126*  K 5.3* 4.9 4.9 4.8 5.0  CL 90* 93* 97* 96* 100*  CO2 20* 19* 18* 19* 20*  GLUCOSE 128* 123* 97 143* 129*  BUN 82* 77* 70* 64* 59*  CREATININE 3.30* 2.85* 2.39* 2.17* 1.78*  CALCIUM 8.5* 8.5* 8.5* 8.7* 8.2*   Liver Function Tests: Recent Labs  Lab 10/24/17 1155  AST 31  ALT 36  ALKPHOS 217*  BILITOT 0.61  PROT 8.3  ALBUMIN 3.0*   CBC: Recent Labs  Lab 10/24/17 1155 10/25/17 0454  WBC 17.3* 14.8*  NEUTROABS 14.0*  --   HGB 13.7 11.9*  HCT 42.1 34.8*  MCV 85.0 83.1  PLT 312 245   Recent Results (from the past 240 hour(s))  TECHNOLOGIST REVIEW     Status: None   Collection Time: 10/24/17 11:55 AM  Result Value Ref Range Status   Technologist Review Rare metamyelocte   Final  MRSA PCR Screening     Status: None   Collection Time: 10/24/17  2:55 PM  Result Value Ref Range Status   MRSA by PCR NEGATIVE NEGATIVE Final    Comment:        The GeneXpert MRSA Assay (FDA approved for NASAL specimens only), is one component of a comprehensive MRSA colonization surveillance program. It is not intended to diagnose MRSA infection nor to guide or monitor treatment for MRSA infections.      Studies: US Renal  Result Date: 10/25/2017 CLINICAL DATA:  Acute kidney injury EXAM: RENAL / URINARY TRACT ULTRASOUND COMPLETE COMPARISON:  CT 09/16/2017 FINDINGS: Right Kidney: Length: 8.7 cm. Echogenicity within normal limits. No mass or hydronephrosis visualized. Left Kidney: Length: 7.8 cm. Echogenicity within normal limits. No mass or hydronephrosis visualized. Bladder: Appears normal for degree of bladder distention. IMPRESSION: Mild atrophy bilaterally.  No acute findings.  No hydronephrosis. Electronically Signed   By: Rolm Baptise M.D.   On: 10/25/2017 09:41   Portable Chest 1 View  Result Date: 10/24/2017 CLINICAL DATA:  Persistent cough.  Chest congestion. EXAM: PORTABLE  CHEST 1 VIEW COMPARISON:  09/30/2017 and 09/25/2017 and 09/16/2017 FINDINGS: The heart size and mediastinal contours are within normal limits. Both lungs are clear. The visualized skeletal structures are unremarkable. IMPRESSION: Normal exam. Electronically Signed   By: Lorriane Shire M.D.   On: 10/24/2017 16:31    Scheduled Meds: . clopidogrel  75 mg Oral Daily  . diphenoxylate-atropine  2 tablet Oral QID  . folic acid  1 mg Oral Daily  . heparin  5,000 Units Subcutaneous Q8H  . levothyroxine  100 mcg Oral QAC breakfast  . mesalamine  1,500 mg Oral BID  . multivitamin with minerals  1 tablet Oral Daily  . oxybutynin  5 mg Oral Daily  . pantoprazole  40 mg Oral BH-[id number]  . predniSONE  10 mg Oral Q breakfast  . [START ON 10/26/2017] protein supplement shake  11 oz Oral Q24H   Continuous Infusions: . sodium chloride 125 mL/hr at 10/25/17 1800    Time spent: 30 minutes    [facility] Pager [phone]. If 7PM-7AM, please contact night-coverage at [email or web address], password [facility] 10/25/2017, 9:20 PM  LOS: 1 day

## 2017-10-26 LAB — BASIC METABOLIC PANEL
Anion gap: 7 (ref 5–15)
BUN: 47 mg/dL — AB (ref 6–20)
CALCIUM: 8.5 mg/dL — AB (ref 8.9–10.3)
CO2: 18 mmol/L — ABNORMAL LOW (ref 22–32)
CREATININE: 1.5 mg/dL — AB (ref 0.44–1.00)
Chloride: 105 mmol/L (ref 101–111)
GFR calc Af Amer: 36 mL/min — ABNORMAL LOW (ref >60)
GFR, EST NON AFRICAN AMERICAN: 31 mL/min — AB (ref >60)
GLUCOSE: 97 mg/dL (ref 65–99)
POTASSIUM: 4.9 mmol/L (ref 3.5–5.1)
SODIUM: 130 mmol/L — AB (ref 135–145)

## 2017-10-26 MED ORDER — HEPARIN SODIUM (PORCINE) 5000 UNIT/ML IJ SOLN
5000.0000 [IU] | Freq: Three times a day (TID) | INTRAMUSCULAR | Status: DC
  Administered 2017-10-26 – 2017-11-03 (×24): 5000 [IU] via SUBCUTANEOUS
  Filled 2017-10-26 (×25): qty 1

## 2017-10-26 NOTE — Evaluation (Signed)
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 Pt s/p exploratory laparotomy, ileocecectomy with end ileostomy on 09/24/17 for SBO secondary to metastatic SB adenocarcinoma with peritoneal carcinomatosis.    Clinical Impression   Pt was admitted for the above. She was mostly independent prior to admission and has several family members with her; she is rarely alone.  She will benefit from continued OT to increase safety and independence with adls. Goals in acute are for min guard level. Pt needs min A for ambulating and mod to max A for LB adls at this time    Follow Up Recommendations  Supervision/Assistance - 24 hour    Equipment Recommendations  None recommended by OT    Recommendations for Other Services       Precautions / Restrictions Precautions Precautions: Fall Restrictions Weight Bearing Restrictions: No      Mobility Bed Mobility Overal bed mobility: Modified Independent             General bed mobility comments: HOB raised  Transfers   Equipment used: None   Sit to Stand: Min guard         General transfer comment: for safety/lines    Balance                                           ADL either performed or assessed with clinical judgement   ADL Overall ADL's : Needs assistance/impaired Eating/Feeding: Independent   Grooming: Standing;Min guard   Upper Body Bathing: Set up;Sitting   Lower Body Bathing: Moderate assistance;Sit to/from stand   Upper Body Dressing : Set up;Sitting   Lower Body Dressing:  Maximal assistance;Sit to/from stand   Toilet Transfer: Minimal assistance;Ambulation;Comfort height toilet;RW   Toileting- Clothing Manipulation and Hygiene: Minimal assistance;Sit to/from stand         General ADL Comments: pt had leaking around bottom of ostomy. RN emptied. Ambulated to bathroom with min A.  Pt unsteady.  Had accident and assisted her with cleaning up and changing mesh panties and socks.       Vision         Perception     Praxis      Pertinent Vitals/Pain       Hand Dominance     Extremity/Trunk Assessment Upper Extremity Assessment Upper Extremity Assessment: Overall WFL for tasks assessed           Communication     Cognition                                           General Comments  pt veered to R when ambulating to bathroom; min A for safety    Exercises     Shoulder Instructions      Home Living Family/patient expects to be discharged to:: Private residence Living Arrangements: Children Available Help at Discharge: Family Type of Home: House             Bathroom  Shower/Tub: Occupational psychologist: Standard     Home Equipment: Environmental consultant - 4 wheels;Cane - single point;Bedside commode;Shower seat   Additional Comments: pt is a retired Therapist, sports; has DME from when husband was alive      Prior Functioning/Environment Level of Independence: Independent        Comments: Pt is generally independent with her mobility.  She does have access to a RW and a cane        OT Problem List: Decreased strength;Decreased activity tolerance;Impaired balance (sitting and/or standing)      OT Treatment/Interventions: Self-care/ADL training;DME and/or AE instruction;Patient/family education;Balance training    OT Goals(Current goals can be found in the care plan section) Acute Rehab OT Goals Patient Stated Goal: home OT Goal Formulation: With patient Time For Goal Achievement: 11/02/17 Potential to Achieve  Goals: Good ADL Goals Pt Will Transfer to Toilet: with min guard assist;ambulating;bedside commode Pt Will Perform Toileting - Clothing Manipulation and hygiene: with supervision;sit to/from stand Additional ADL Goal #1: pt will perform LB adls with min guard  OT Frequency: Min 2X/week   Barriers to D/C:            Co-evaluation              AM-PAC PT "6 Clicks" Daily Activity     Outcome Measure Help from another person eating meals?: None Help from another person taking care of personal grooming?: A Little Help from another person toileting, which includes using toliet, bedpan, or urinal?: A Little Help from another person bathing (including washing, rinsing, drying)?: A Lot Help from another person to put on and taking off regular upper body clothing?: A Little Help from another person to put on and taking off regular lower body clothing?: A Lot 6 Click Score: 17   End of Session    Activity Tolerance: Patient tolerated treatment well Patient left: in bed;with call bell/phone within reach;with nursing/sitter in room  OT Visit Diagnosis: Unsteadiness on feet (R26.81);Muscle weakness (generalized) (M62.81)                Time: 2876-8115 OT Time Calculation (min): 29 min Charges:  OT General Charges $OT Visit: 1 Visit OT Evaluation $OT Eval Low Complexity: 1 Low OT Treatments $Self Care/Home Management : 8-22 mins G-Codes:     Wickes, OTR/L [phone] 10/26/2017  Neel Buffone 10/26/2017, 3:19 PM

## 2017-10-26 NOTE — Progress Notes (Signed)
[facility] PROGRESS NOTE  Shaden Weidinger EHM:[id number] DOB: 26-Dec-1930 DOA: 10/24/2017 PCP: Prince Solian, MD  Interim summary and HPI 83 y.o. 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  Patient came to oncology clinic as a regular follow-up.  Patient reported increased output from ileal conduit requiring every half an hour to 1 hour to empty.  She has poor oral intake associated with nausea, not feeling well and generalized weakness.  She was treated with oral azithromycin for a week for upper respiratory infection.  She completed antibiotics last week.  Still having some cough.  In the oncology clinic, patient was found to have BUN 80, creatinine of 3.6, serum sodium level of 120, potassium 5.8.  I have discussed with Dr.Sherril.  Recommended to admit the patient for medical management.   Assessment/Plan: 1-acute kidney injury: with presumed ATN. -significant improvement with IVF's -will continue minimizing/avoiding nephrotoxic agents   -maintain adequate hydration  -follow BMET in am   2-hypovolemic hyponatremia: -in setting of increase GI loses from ostomy -continue IVF's; but rate adjusted now -patient advise to increase oral intake as well will continue IVF's -continue imodium and lomotil to slow down GI transit  -follow electrolytes trend and replete as needed  -no neurologic deficit appreciated -Na up to 130 now.  3-high output ileostomy -continue PRN imodium and lomotil (added 12/13) -stool consistency is changing and patient reported slight improvement in quantity  -continue to follow electrolytes and replete them as needed  -wound care/ostomy service to guide regarding care; bag continue to blowout and have  trouble maintaining sealing integrity.   4-severe protein calorie malnutrition  -with over 32 pounds weight loss in the last month -nutritional service on board; will follow rec's for feeding supplements   5-hx of small bowel carcinoma  -s/p ileostomy -will follow CCS rec's from post surgical rec's -oncology also on board; will follow any rec's  6-hx of hypothyroidism -TSH WNL -will continue synthroid   7-hx of TIA -stable and no focal deficit seen  -continue plavix for secondary prevention   8-hx of crohn's disease -continue mesalamine and prednisone  -no abd pain, hematochezia or melena   9-stage 2 pressure injury -continue preventive measures and constant repositioning -continue barrier cream as needed.  Code Status: DNR Family Communication: no family at bedside  Disposition Plan: will transfer to stepdown; continue IVF's and electrolytes repletion. Will continue lomotil and continue PRN imodium. Follow CCS recommendations and wound care/ostomy service instructions.   Consultants:  Oncology  CCS  Procedures:  See below for x-ray reports   Antibiotics:  None   HPI/Subjective: Feeling better, denies CP and SOB. Patient with slight slowdown in her BM's, even still having intermittent blowout from ileostomy bag.   Objective: Vitals:   10/26/17 1200 10/26/17 1400  BP: (!) 112/56   Pulse: 80 82  Resp: 15 (!) 23  Temp: (!) 97.5 F (36.4 C)   SpO2: 99% 100%    Intake/Output Summary (Last 24 hours) at 10/26/2017 1637 Last data filed at 10/26/2017 1200 Gross per 24 hour  Intake 3000 ml  Output 1450 ml  Net 1550 ml   Filed Weights   10/24/17 1600 10/25/17 0357  Weight: 65.5 kg (144 lb 6.4 oz) 65.2 kg (143 lb 11.8 oz)    Exam:   General: no fever, no CP, no  nausea, no vomiting. Patient continue to have intermittent blowout from ostomy bag, but overall reported stool to be less loose and to have less BM overall.   Cardiovascular: S1 and S2, no rubs,  no gallops, positive SEM.   Respiratory: good air movement, no wheezing, no crackles. Positive scattered rhonchi.   Abdomen: soft, NT, ND, positive BS, ileostomy full and with active leakage during examination, surrounding erythema appreciated.  Musculoskeletal: no edema, no cyanosis, no clubbing  Skin: stage 2 in coccyx area appreciated, foam dressing in place.  Data Reviewed: Basic Metabolic Panel: Recent Labs  Lab 10/24/17 2305 10/25/17 0454 10/25/17 1109 10/25/17 1640 10/26/17 0300  NA 124* 125* 126* 126* 130*  K 4.9 4.9 4.8 5.0 4.9  CL 93* 97* 96* 100* 105  CO2 19* 18* 19* 20* 18*  GLUCOSE 123* 97 143* 129* 97  BUN 77* 70* 64* 59* 47*  CREATININE 2.85* 2.39* 2.17* 1.78* 1.50*  CALCIUM 8.5* 8.5* 8.7* 8.2* 8.5*   Liver Function Tests: Recent Labs  Lab 10/24/17 1155  AST 31  ALT 36  ALKPHOS 217*  BILITOT 0.61  PROT 8.3  ALBUMIN 3.0*   CBC: Recent Labs  Lab 10/24/17 1155 10/25/17 0454  WBC 17.3* 14.8*  NEUTROABS 14.0*  --   HGB 13.7 11.9*  HCT 42.1 34.8*  MCV 85.0 83.1  PLT 312 245   Recent Results (from the past 240 hour(s))  TECHNOLOGIST REVIEW     Status: None   Collection Time: 10/24/17 11:55 AM  Result Value Ref Range Status   Technologist Review Rare metamyelocte   Final  MRSA PCR Screening     Status: None   Collection Time: 10/24/17  2:55 PM  Result Value Ref Range Status   MRSA by PCR NEGATIVE NEGATIVE Final    Comment:        The GeneXpert MRSA Assay (FDA approved for NASAL specimens only), is one component of a comprehensive MRSA colonization surveillance program. It is not intended to diagnose MRSA infection nor to guide or monitor treatment for MRSA infections.      Studies: US Renal  Result Date: 10/25/2017 CLINICAL DATA:  Acute kidney injury EXAM: RENAL / URINARY TRACT ULTRASOUND COMPLETE COMPARISON:  CT 09/16/2017 FINDINGS: Right Kidney: Length: 8.7 cm. Echogenicity within normal limits. No mass or hydronephrosis  visualized. Left Kidney: Length: 7.8 cm. Echogenicity within normal limits. No mass or hydronephrosis visualized. Bladder: Appears normal for degree of bladder distention. IMPRESSION: Mild atrophy bilaterally.  No acute findings.  No hydronephrosis. Electronically Signed   By: Rolm Baptise M.D.   On: 10/25/2017 09:41    Scheduled Meds: . clopidogrel  75 mg Oral Daily  . diphenoxylate-atropine  2 tablet Oral QID  . folic acid  1 mg Oral Daily  . heparin  5,000 Units Subcutaneous Q8H  . levothyroxine  100 mcg Oral QAC breakfast  . mesalamine  1,500 mg Oral BID  . multivitamin with minerals  1 tablet Oral Daily  . oxybutynin  5 mg Oral Daily  . pantoprazole  40 mg Oral BH-q7a  . predniSONE  10 mg Oral Q breakfast  . protein supplement shake  11 oz Oral Q24H   Continuous Infusions: . sodium chloride 125 mL/hr at 10/26/17 1200    Time spent: 30 minutes    [facility] Pager [phone]. If 7PM-7AM, please contact night-coverage at [email or web address], password [facility] 10/26/2017, 4:37 PM  LOS: 2 days

## 2017-10-26 NOTE — Progress Notes (Signed)
    CC: Weakness/dehydration/acute kidney injury  Subjective: Patient says she is feeling better.  Reports p.o. intake is fairly good.  Objective: Vital signs in last 24 hours: Temp:  [97.5 F (36.4 C)-97.8 F (36.6 C)] 97.5 F (36.4 C) (12/14 0800) Pulse Rate:  [76-101] 91 (12/14 0900) Resp:  [14-20] 15 (12/14 0900) BP: (95-118)/(41-81) 118/81 (12/14 0800) SpO2:  [96 %-99 %] 98 % (12/14 0900) Last BM Date: 10/26/17 4375 IV 700 urine Stool 1050 Afebrile, VSS NA 130 creatinine is better   Intake/Output from previous day: 12/13 0701 - 12/14 0700 In: 4375 [I.V.:4375] Out: 1750 [Urine:700; Stool:1050] Intake/Output this shift: Total I/O In: 625 [I.V.:625] Out: 400 [Stool:400]  General appearance: alert, cooperative and no distress Resp: clear to auscultation bilaterally GI: Soft, incisions healing nicely.  Ostomy is working well.  Lab Results:  Recent Labs    10/24/17 1155 10/25/17 0454  WBC 17.3* 14.8*  HGB 13.7 11.9*  HCT 42.1 34.8*  PLT 312 245    BMET Recent Labs    10/25/17 1640 10/26/17 0300  NA 126* 130*  K 5.0 4.9  CL 100* 105  CO2 20* 18*  GLUCOSE 129* 97  BUN 59* 47*  CREATININE 1.78* 1.50*  CALCIUM 8.2* 8.5*   PT/INR No results for input(s): LABPROT, INR in the last 72 hours.  Recent Labs  Lab 10/24/17 1155  AST 31  ALT 36  ALKPHOS 217*  BILITOT 0.61  PROT 8.3  ALBUMIN 3.0*     Lipase     Component Value Date/Time   LIPASE 17 09/16/2017 1507     Medications: . clopidogrel  75 mg Oral Daily  . diphenoxylate-atropine  2 tablet Oral QID  . folic acid  1 mg Oral Daily  . heparin  5,000 Units Subcutaneous Q8H  . levothyroxine  100 mcg Oral QAC breakfast  . mesalamine  1,500 mg Oral BID  . multivitamin with minerals  1 tablet Oral Daily  . oxybutynin  5 mg Oral Daily  . pantoprazole  40 mg Oral BH-q7a  . predniSONE  10 mg Oral Q breakfast  . protein supplement shake  11 oz Oral Q24H   . sodium chloride 125 mL/hr at  10/26/17 0900   Anti-infectives (From admission, onward)   None      Assessment/Plan Severe dehydration and acute kidney injury  -secondary to high ileostomy output  Metastatic adenocarcinoma with peritoneal carcinomatosis, pT4, pN1, pM1  - S/P exploratory laparotomy, ileocecectomy,  with end ileostomy, 09/24/17 Dr. Donnie Mesa  - History of recurrent small bowel obstruction/Crohn's disease/hospitalized 11/4-11/18/18.  Atrial fibrillation with rapid ventricular rate History of hypertension History of hypothyroidism History of lung cancer and prior lobectomy History of tobacco use Mild AR, trivial PR, mild TR. GERD/history of duodenal ulcers Legally blind/macular degeneration Malnutrition History of TIAs.  FEN:  IV fluids/regular diet ID: none DVT:  SCD Foley:   Follow-up:  Dr. Georgette Dover  She has about a liter reported for output from her ostomy yesterday she also notes she is voiding a lot.  Patient is on Imodium 2 mg every 6 hours as needed and Lomotil 2.5 mg, 2 tablets 4 times daily.  Nothing to add from out standpoint at this time.          LOS: 2 days    Jamarrion Budai 10/26/2017 [phone]

## 2017-10-26 NOTE — Consult Note (Signed)
[facility] Nurse ostomy follow up Patient visit made to determine if ostomy pouching system applied yesterday afternoon was still intact and it had been replaced during the night by Jennye Moccasin, RN due to leaking. Copious amounts of stool draining into pouch. Bedside RN states she just gave Imodium this am which is written as PRN. Asked her to be sure to give as frequently as is possible and to pass on to the next shift. Sometimes prn meds do not get utilized correctly.  Supplies restocked at bedside.There are no WOC Nursing services available over the weekend. We will not follow, but will remain available to this patient, to nursing, and the medical and/or surgical teams.  Please re-consult if we need to assist further.   Fara Olden, RN-C, WTA-C, [facility] Ost[facility] Care Associate

## 2017-10-27 LAB — BASIC METABOLIC PANEL
ANION GAP: 7 (ref 5–15)
BUN: 28 mg/dL — ABNORMAL HIGH (ref 6–20)
CHLORIDE: 106 mmol/L (ref 101–111)
CO2: 19 mmol/L — AB (ref 22–32)
Calcium: 8.1 mg/dL — ABNORMAL LOW (ref 8.9–10.3)
Creatinine, Ser: 1.23 mg/dL — ABNORMAL HIGH (ref 0.44–1.00)
GFR calc non Af Amer: 39 mL/min — ABNORMAL LOW (ref >60)
GFR, EST AFRICAN AMERICAN: 45 mL/min — AB (ref >60)
Glucose, Bld: 92 mg/dL (ref 65–99)
POTASSIUM: 4.4 mmol/L (ref 3.5–5.1)
Sodium: 132 mmol/L — ABNORMAL LOW (ref 135–145)

## 2017-10-27 LAB — CBC
HCT: 29.5 % — ABNORMAL LOW (ref 36.0–46.0)
HEMOGLOBIN: 9.6 g/dL — AB (ref 12.0–15.0)
MCH: 27.7 pg (ref 26.0–34.0)
MCHC: 32.5 g/dL (ref 30.0–36.0)
MCV: 85.3 fL (ref 78.0–100.0)
Platelets: 240 10*3/uL (ref 150–400)
RBC: 3.46 MIL/uL — AB (ref 3.87–5.11)
RDW: 15.4 % (ref 11.5–15.5)
WBC: 11.9 10*3/uL — AB (ref 4.0–10.5)

## 2017-10-27 MED ORDER — ESCITALOPRAM OXALATE 10 MG PO TABS
5.0000 mg | ORAL_TABLET | Freq: Every day | ORAL | Status: DC
  Administered 2017-10-27 – 2017-10-30 (×4): 5 mg via ORAL
  Administered 2017-10-31: 10 mg via ORAL
  Administered 2017-11-01 – 2017-11-03 (×3): 5 mg via ORAL
  Filled 2017-10-27 (×8): qty 1

## 2017-10-27 MED ORDER — IPRATROPIUM-ALBUTEROL 0.5-2.5 (3) MG/3ML IN SOLN: 3.0000 mL | RESPIRATORY_TRACT | Status: DC | PRN

## 2017-10-27 MED ORDER — SODIUM CHLORIDE 0.9 % IV SOLN
INTRAVENOUS | Status: DC
  Administered 2017-10-27 – 2017-10-29 (×4): via INTRAVENOUS
  Administered 2017-10-29: 1000 mL via INTRAVENOUS
  Administered 2017-10-30 – 2017-11-03 (×3): via INTRAVENOUS

## 2017-10-27 NOTE — Progress Notes (Signed)
[facility] PROGRESS NOTE  Terri [name] PIR:[id number] DOB: [birth year] DOA: 10/24/2017 PCP: Prince Solian, MD  Interim summary and HPI 83 y.o. 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  Patient came to oncology clinic as a regular follow-up.  Patient reported increased output from ileal conduit requiring every half an hour to 1 hour to empty.  She has poor oral intake associated with nausea, not feeling well and generalized weakness.  She was treated with oral azithromycin for a week for upper respiratory infection.  She completed antibiotics last week.  Still having some cough.  In the oncology clinic, patient was found to have BUN 80, creatinine of 3.6, serum sodium level of 120, potassium 5.8.  I have discussed with Dr.Sherril.  Recommended to admit the patient for medical management.   Assessment/Plan: 1-acute kidney injury: with presumed ATN. -significant improvement with IVF's -will continue minimizing/avoiding nephrotoxic agents   -maintain adequate hydration  -follow BMET in am   2-hypovolemic hyponatremia: -in setting of increase GI loses from ostomy -continue IVF's; but rate adjusted now -patient advise to increase oral intake as well will continue IVF's -continue imodium and lomotil to slow down GI transit  -follow electrolytes trend and replete as needed  -no neurologic deficit appreciated -Na up to 130 now.  3-high output ileostomy -continue PRN imodium and lomotil (added 12/13) -stool consistency is changing and patient reported slight improvement in quantity  -continue to follow electrolytes and replete them as needed  -wound care/ostomy service to guide regarding care; bag continue to blowout and have  trouble maintaining sealing integrity.   4-severe protein calorie malnutrition  -with over 32 pounds weight loss in the last month -nutritional service on board; will follow rec's for feeding supplements   5-hx of small bowel carcinoma  -s/p ileostomy -will follow CCS rec's from post surgical rec's -oncology also on board; will follow any rec's  6-hx of hypothyroidism -TSH WNL -will continue synthroid   7-hx of TIA -stable and no focal deficit seen  -continue plavix for secondary prevention   8-hx of crohn's disease -continue mesalamine and prednisone  -no abd pain, hematochezia or melena   9-stage 2 pressure injury -continue preventive measures and constant repositioning -continue barrier cream as needed.  Code Status: DNR Family Communication: no family at bedside  Disposition Plan: Ileostomy remains with high output.  Will continue lomotil and continue PRN imodium. Follow CCS recommendations and wound care/ostomy service instructions.   Consultants:  Oncology  CCS  Procedures:  See below for x-ray reports   Antibiotics:  None   HPI/Subjective: No nausea no vomiting still feeling fatigued and tired.  No abdominal pain.  Objective: Vitals:   10/27/17 0620 10/27/17 1343  BP: 123/60 128/62  Pulse: 68 95  Resp: 20 18  Temp: 98.2 F (36.8 C) 98.6 F (37 C)  SpO2: 97% 97%    Intake/Output Summary (Last 24 hours) at 10/27/2017 1658 Last data filed at 10/27/2017 1343 Gross per 24 hour  Intake 1990 ml  Output 800 ml  Net 1190 ml   Filed Weights   10/24/17 1600 10/25/17 0357  Weight: 65.5 kg (144 lb 6.4 oz) 65.2 kg (143 lb 11.8 oz)    Exam:   General: no fever, no CP, no nausea, no vomiting. Patient continue to have intermittent blowout from ostomy bag,  but overall reported stool to be less loose and to have less BM overall.   Cardiovascular: S1 and S2, no rubs, no gallops, positive SEM.   Respiratory: good air movement, no wheezing, no  crackles. Positive scattered rhonchi.   Abdomen: soft, NT, ND, positive BS, ileostomy full and with active leakage during examination, surrounding erythema appreciated.  Musculoskeletal: no edema, no cyanosis, no clubbing  Skin: stage 2 in coccyx area appreciated, foam dressing in place.  Data Reviewed: Basic Metabolic Panel: Recent Labs  Lab 10/25/17 0454 10/25/17 1109 10/25/17 1640 10/26/17 0300 10/27/17 0708  NA 125* 126* 126* 130* 132*  K 4.9 4.8 5.0 4.9 4.4  CL 97* 96* 100* 105 106  CO2 18* 19* 20* 18* 19*  GLUCOSE 97 143* 129* 97 92  BUN 70* 64* 59* 47* 28*  CREATININE 2.39* 2.17* 1.78* 1.50* 1.23*  CALCIUM 8.5* 8.7* 8.2* 8.5* 8.1*   Liver Function Tests: Recent Labs  Lab 10/24/17 1155  AST 31  ALT 36  ALKPHOS 217*  BILITOT 0.61  PROT 8.3  ALBUMIN 3.0*   CBC: Recent Labs  Lab 10/24/17 1155 10/25/17 0454 10/27/17 0708  WBC 17.3* 14.8* 11.9*  NEUTROABS 14.0*  --   --   HGB 13.7 11.9* 9.6*  HCT 42.1 34.8* 29.5*  MCV 85.0 83.1 85.3  PLT 312 245 240   Recent Results (from the past 240 hour(s))  TECHNOLOGIST REVIEW     Status: None   Collection Time: 10/24/17 11:55 AM  Result Value Ref Range Status   Technologist Review Rare metamyelocte   Final  MRSA PCR Screening     Status: None   Collection Time: 10/24/17  2:55 PM  Result Value Ref Range Status   MRSA by PCR NEGATIVE NEGATIVE Final    Comment:        The GeneXpert MRSA Assay (FDA approved for NASAL specimens only), is one component of a comprehensive MRSA colonization surveillance program. It is not intended to diagnose MRSA infection nor to guide or monitor treatment for MRSA infections.      Studies: No results found.  Scheduled Meds: . clopidogrel  75 mg Oral Daily  . diphenoxylate-atropine  2 tablet Oral QID  . escitalopram  5 mg Oral Daily  . folic acid  1 mg Oral Daily  . heparin  5,000 Units Subcutaneous Q8H  . levothyroxine  100 mcg Oral QAC breakfast  . mesalamine  1,500  mg Oral BID  . multivitamin with minerals  1 tablet Oral Daily  . oxybutynin  5 mg Oral Daily  . pantoprazole  40 mg Oral BH-q7a  . predniSONE  10 mg Oral Q breakfast  . protein supplement shake  11 oz Oral Q24H   Continuous Infusions: . sodium chloride 100 mL/hr at 10/27/17 4580    Time spent: 30 minutes  Author:  Berle Mull, MD [facility] Pager: [phone] 10/27/2017 4:59 PM     If 7PM-7AM, please contact night-coverage at [email or web address], password [facility] 10/27/2017, 4:58 PM  LOS: 3 days

## 2017-10-27 NOTE — Progress Notes (Signed)
General Surgery [facility]  Assessment & Plan: S/P exploratory laparotomy/ ileocecectomy with end ileostomy,09/24/17, Dr. Donnie Mesa    Metastatic SB adenocarcinoma with peritoneal carcinomatosis High ileostomy output - Imodium to q6 PRN - output thickened this AM WOCN consult to help with ostomy appliance Wound care Will follow up on Monday if patient remains in hospital.        Earnstine Regal, MD, [facility]       Office: [phone]    Chief Complaint: Dehydration, high ileostomy output  Subjective: Patient comfortable, no complaints this AM.  Objective: Vital signs in last 24 hours: Temp:  [97.4 F (36.3 C)-98.3 F (36.8 C)] 98.2 F (36.8 C) (12/15 0620) Pulse Rate:  [65-97] 68 (12/15 0620) Resp:  [13-27] 20 (12/15 0620) BP: (92-123)/(37-63) 123/60 (12/15 0620) SpO2:  [91 %-100 %] 97 % (12/15 0620) Last BM Date: 10/27/17  Intake/Output from previous day: 12/14 0701 - 12/15 0700 In: 2750 [I.V.:2750] Out: 1175 [Stool:1175] Intake/Output this shift: Total I/O In: -  Out: 300 [Stool:300]  Physical Exam: HEENT - sclerae clear, mucous membranes moist Neck - soft Abdomen - soft without distension; ostomy RUQ with "oatmeal" consistency output in bag Ext - no edema, non-tender Neuro - alert & oriented, no focal deficits  Lab Results:  Recent Labs    10/25/17 0454 10/27/17 0708  WBC 14.8* 11.9*  HGB 11.9* 9.6*  HCT 34.8* 29.5*  PLT 245 240   BMET Recent Labs    10/26/17 0300 10/27/17 0708  NA 130* 132*  K 4.9 4.4  CL 105 106  CO2 18* 19*  GLUCOSE 97 92  BUN 47* 28*  CREATININE 1.50* 1.23*  CALCIUM 8.5* 8.1*   PT/INR No results for input(s): LABPROT, INR in the last 72 hours. Comprehensive Metabolic Panel:    Component Value Date/Time   NA 132 (L) 10/27/2017 0708   NA 130 (L) 10/26/2017 0300   NA 120 (L) 10/24/2017 1155   K 4.4 10/27/2017 0708   K 4.9 10/26/2017 0300   K 5.8 (H)  10/24/2017 1155   CL 106 10/27/2017 0708   CL 105 10/26/2017 0300   CO2 19 (L) 10/27/2017 0708   CO2 18 (L) 10/26/2017 0300   CO2 16 (L) 10/24/2017 1155   BUN 28 (H) 10/27/2017 0708   BUN 47 (H) 10/26/2017 0300   BUN 83.4 (H) 10/24/2017 1155   CREATININE 1.23 (H) 10/27/2017 0708   CREATININE 1.50 (H) 10/26/2017 0300   CREATININE 3.6 (HH) 10/24/2017 1155   GLUCOSE 92 10/27/2017 0708   GLUCOSE 97 10/26/2017 0300   GLUCOSE 197 (H) 10/24/2017 1155   CALCIUM 8.1 (L) 10/27/2017 0708   CALCIUM 8.5 (L) 10/26/2017 0300   CALCIUM 10.1 10/24/2017 1155   AST 31 10/24/2017 1155   AST 16 09/27/2017 0419   AST 15 09/24/2017 0431   ALT 36 10/24/2017 1155   ALT 12 (L) 09/27/2017 0419   ALT 10 (L) 09/24/2017 0431   ALKPHOS 217 (H) 10/24/2017 1155   ALKPHOS 55 09/27/2017 0419   ALKPHOS 45 09/24/2017 0431   BILITOT 0.61 10/24/2017 1155   BILITOT 0.5 09/27/2017 0419   BILITOT 0.5 09/24/2017 0431   PROT 8.3 10/24/2017 1155   PROT 4.8 (L) 09/27/2017 0419   PROT 5.0 (L) 09/24/2017 0431   ALBUMIN 3.0 (L) 10/24/2017 1155   ALBUMIN 2.2 (L) 09/27/2017 0419   ALBUMIN 2.6 (L) 09/24/2017 0431    Studies/Results:  US Renal  Result Date: 10/25/2017 CLINICAL DATA:  Acute kidney injury EXAM: RENAL / URINARY TRACT ULTRASOUND COMPLETE COMPARISON:  CT 09/16/2017 FINDINGS: Right Kidney: Length: 8.7 cm. Echogenicity within normal limits. No mass or hydronephrosis visualized. Left Kidney: Length: 7.8 cm. Echogenicity within normal limits. No mass or hydronephrosis visualized. Bladder: Appears normal for degree of bladder distention. IMPRESSION: Mild atrophy bilaterally.  No acute findings.  No hydronephrosis. Electronically Signed   By: Rolm Baptise M.D.   On: 10/25/2017 09:41      Alsace Dowd M 10/27/2017  Patient ID: Terri [name], female   DOB: [birth year], 83 y.o.   MRN: [id number]

## 2017-10-27 NOTE — Progress Notes (Signed)
Pt has active telemetry order at time of transfer from icu. Nurse from icu paged MD on call and on call MD wants MD that ordered for transfer to reevaluate. Notified house superviser.

## 2017-10-28 LAB — CBC WITH DIFFERENTIAL/PLATELET
Basophils Absolute: 0 10*3/uL (ref 0.0–0.1)
Basophils Relative: 0 %
EOS PCT: 2 %
Eosinophils Absolute: 0.2 10*3/uL (ref 0.0–0.7)
HEMATOCRIT: 29.7 % — AB (ref 36.0–46.0)
Hemoglobin: 9.8 g/dL — ABNORMAL LOW (ref 12.0–15.0)
LYMPHS PCT: 32 %
Lymphs Abs: 4.1 10*3/uL — ABNORMAL HIGH (ref 0.7–4.0)
MCH: 28.3 pg (ref 26.0–34.0)
MCHC: 33 g/dL (ref 30.0–36.0)
MCV: 85.8 fL (ref 78.0–100.0)
MONO ABS: 1.2 10*3/uL — AB (ref 0.1–1.0)
MONOS PCT: 9 %
Neutro Abs: 7.5 10*3/uL (ref 1.7–7.7)
Neutrophils Relative %: 57 %
PLATELETS: 263 10*3/uL (ref 150–400)
RBC: 3.46 MIL/uL — ABNORMAL LOW (ref 3.87–5.11)
RDW: 15.7 % — AB (ref 11.5–15.5)
WBC: 13.1 10*3/uL — ABNORMAL HIGH (ref 4.0–10.5)

## 2017-10-28 LAB — COMPREHENSIVE METABOLIC PANEL
ALT: 16 U/L (ref 14–54)
ANION GAP: 7 (ref 5–15)
AST: 16 U/L (ref 15–41)
Albumin: 2 g/dL — ABNORMAL LOW (ref 3.5–5.0)
Alkaline Phosphatase: 103 U/L (ref 38–126)
BILIRUBIN TOTAL: 0.8 mg/dL (ref 0.3–1.2)
BUN: 23 mg/dL — ABNORMAL HIGH (ref 6–20)
CHLORIDE: 109 mmol/L (ref 101–111)
CO2: 17 mmol/L — ABNORMAL LOW (ref 22–32)
Calcium: 8 mg/dL — ABNORMAL LOW (ref 8.9–10.3)
Creatinine, Ser: 0.97 mg/dL (ref 0.44–1.00)
GFR, EST NON AFRICAN AMERICAN: 52 mL/min — AB (ref >60)
Glucose, Bld: 82 mg/dL (ref 65–99)
POTASSIUM: 4.6 mmol/L (ref 3.5–5.1)
Sodium: 133 mmol/L — ABNORMAL LOW (ref 135–145)
TOTAL PROTEIN: 5.1 g/dL — AB (ref 6.5–8.1)

## 2017-10-28 LAB — MAGNESIUM: MAGNESIUM: 1 mg/dL — AB (ref 1.7–2.4)

## 2017-10-28 MED ORDER — LOPERAMIDE HCL 2 MG PO CAPS
2.0000 mg | ORAL_CAPSULE | Freq: Two times a day (BID) | ORAL | Status: DC
  Administered 2017-10-28 (×2): 2 mg via ORAL
  Filled 2017-10-28 (×2): qty 1

## 2017-10-28 MED ORDER — MAGNESIUM SULFATE 4 GM/100ML IV SOLN
4.0000 g | Freq: Once | INTRAVENOUS | Status: AC
  Administered 2017-10-28: 4 g via INTRAVENOUS
  Filled 2017-10-28: qty 100

## 2017-10-28 NOTE — Progress Notes (Signed)
[facility] PROGRESS NOTE  Terri [name] ZOX:[id number] DOB: 05/16/1931 DOA: 10/24/2017 PCP: Prince Solian, MD  Interim summary and HPI 83 y.o. 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  Patient came to oncology clinic as a regular follow-up.  Patient reported increased output from ileal conduit requiring every half an hour to 1 hour to empty.  She has poor oral intake associated with nausea, not feeling well and generalized weakness.  She was treated with oral azithromycin for a week for upper respiratory infection.  She completed antibiotics last week.  Still having some cough.  In the oncology clinic, patient was found to have BUN 80, creatinine of 3.6, serum sodium level of 120, potassium 5.8.  I have discussed with Dr.Sherril.  Recommended to admit the patient for medical management.   Assessment/Plan: 1-acute kidney injury: with presumed ATN. -significant improvement with IVF's -will continue minimizing/avoiding nephrotoxic agents   -maintain adequate hydration  -follow BMET in am   2-hypovolemic hyponatremia: -in setting of increase GI loses from ostomy -continue IVF's; but rate adjusted now -patient advise to increase oral intake as well will continue IVF's -continue imodium and lomotil to slow down GI transit  -follow electrolytes trend and replete as needed  -no neurologic deficit appreciated -Na up to 130 now.  3-high output ileostomy -continue PRN imodium and lomotil (added 12/13) -stool consistency is changing and patient reported slight improvement in quantity  -continue to follow electrolytes and replete them as needed  -wound care/ostomy service to guide regarding care; bag continue to blowout and have  trouble maintaining sealing integrity.  -Scheduled Imodium added today.  4-severe protein calorie malnutrition  -with over 32 pounds weight loss in the last month -nutritional service on board; will follow rec's for feeding supplements   5-hx of small bowel carcinoma  -s/p ileostomy -will follow CCS rec's from post surgical rec's -oncology also on board; will follow any rec's  6-hx of hypothyroidism -TSH WNL -will continue synthroid   7-hx of TIA -stable and no focal deficit seen  -continue plavix for secondary prevention   8-hx of crohn's disease -continue mesalamine and prednisone  -no abd pain, hematochezia or melena   9-stage 2 pressure injury -continue preventive measures and constant repositioning -continue barrier cream as needed.  Code Status: DNR Family Communication: no family at bedside  Disposition Plan: Ileostomy remains with high output.  Will continue lomotil and continue scheduled Imodium. Follow CCS recommendations and wound care/ostomy service instructions.   Consultants:  Oncology  CCS  Procedures:  See below for x-ray reports   Antibiotics:  None   HPI/Subjective: Feeling better, energy levels are getting better as well.  No nausea no vomiting.  Objective: Vitals:   10/28/17 0614 10/28/17 1531  BP: (!) 115/51 (!) 100/54  Pulse: 72 79  Resp: 18 17  Temp: 97.9 F (36.6 C) 97.8 F (36.6 C)  SpO2: 98% 95%    Intake/Output Summary (Last 24 hours) at 10/28/2017 1630 Last data filed at 10/28/2017 1629 Gross per 24 hour  Intake 2080 ml  Output 1625 ml  Net 455 ml   Filed Weights   10/24/17 1600 10/25/17 0357 10/28/17 0614  Weight: 65.5 kg (144 lb 6.4 oz) 65.2 kg (143 lb 11.8 oz) 70.9 kg (156 lb 4.9 oz)    Exam:   General: no fever,  no CP, no nausea, no vomiting. Patient continue to have intermittent blowout from ostomy bag, but overall reported stool to be less loose and to have less BM overall.   Cardiovascular: S1 and S2, no  rubs, no gallops, positive SEM.   Respiratory: good air movement, no wheezing, no crackles. Positive scattered rhonchi.   Abdomen: soft, NT, ND, positive BS, ileostomy full and with active leakage during examination, surrounding erythema appreciated.  Musculoskeletal: no edema, no cyanosis, no clubbing  Skin: stage 2 in coccyx area appreciated, foam dressing in place.  Data Reviewed: Basic Metabolic Panel: Recent Labs  Lab 10/25/17 1109 10/25/17 1640 10/26/17 0300 10/27/17 0708 10/28/17 0404  NA 126* 126* 130* 132* 133*  K 4.8 5.0 4.9 4.4 4.6  CL 96* 100* 105 106 109  CO2 19* 20* 18* 19* 17*  GLUCOSE 143* 129* 97 92 82  BUN 64* 59* 47* 28* 23*  CREATININE 2.17* 1.78* 1.50* 1.23* 0.97  CALCIUM 8.7* 8.2* 8.5* 8.1* 8.0*  MG  --   --   --   --  1.0*   Liver Function Tests: Recent Labs  Lab 10/24/17 1155 10/28/17 0404  AST 31 16  ALT 36 16  ALKPHOS 217* 103  BILITOT 0.61 0.8  PROT 8.3 5.1*  ALBUMIN 3.0* 2.0*   CBC: Recent Labs  Lab 10/24/17 1155 10/25/17 0454 10/27/17 0708 10/28/17 0404  WBC 17.3* 14.8* 11.9* 13.1*  NEUTROABS 14.0*  --   --  7.5  HGB 13.7 11.9* 9.6* 9.8*  HCT 42.1 34.8* 29.5* 29.7*  MCV 85.0 83.1 85.3 85.8  PLT 312 245 240 263   Recent Results (from the past 240 hour(s))  TECHNOLOGIST REVIEW     Status: None   Collection Time: 10/24/17 11:55 AM  Result Value Ref Range Status   Technologist Review Rare metamyelocte   Final  MRSA PCR Screening     Status: None   Collection Time: 10/24/17  2:55 PM  Result Value Ref Range Status   MRSA by PCR NEGATIVE NEGATIVE Final    Comment:        The GeneXpert MRSA Assay (FDA approved for NASAL specimens only), is one component of a comprehensive MRSA colonization surveillance program. It is not intended to diagnose MRSA infection nor to guide or monitor treatment for MRSA infections.      Studies: No results found.  Scheduled Meds: . clopidogrel  75 mg Oral Daily  . diphenoxylate-atropine   2 tablet Oral QID  . escitalopram  5 mg Oral Daily  . folic acid  1 mg Oral Daily  . heparin  5,000 Units Subcutaneous Q8H  . levothyroxine  100 mcg Oral QAC breakfast  . loperamide  2 mg Oral BID  . mesalamine  1,500 mg Oral BID  . multivitamin with minerals  1 tablet Oral Daily  . oxybutynin  5 mg Oral Daily  . pantoprazole  40 mg Oral BH-q7a  . predniSONE  10 mg Oral Q breakfast  . protein supplement shake  11 oz Oral Q24H   Continuous Infusions: . sodium chloride 100 mL/hr at 10/27/17 1800    Time spent: 30 minutes  Author:  Berle Mull, MD [facility] Pager: [phone] 10/28/2017 4:30 PM     If 7PM-7AM, please contact night-coverage at [email or web address], password [facility] 10/28/2017, 4:30 PM  LOS: 4 days

## 2017-10-28 NOTE — Progress Notes (Deleted)
Pt was accidentally transferred in epic and active orders got cancelled. Reactivated orders per previous electronic order.

## 2017-10-29 LAB — BASIC METABOLIC PANEL
ANION GAP: 5 (ref 5–15)
BUN: 22 mg/dL — ABNORMAL HIGH (ref 6–20)
CALCIUM: 8 mg/dL — AB (ref 8.9–10.3)
CO2: 19 mmol/L — ABNORMAL LOW (ref 22–32)
Chloride: 110 mmol/L (ref 101–111)
Creatinine, Ser: 1.02 mg/dL — ABNORMAL HIGH (ref 0.44–1.00)
GFR calc non Af Amer: 49 mL/min — ABNORMAL LOW (ref >60)
GFR, EST AFRICAN AMERICAN: 57 mL/min — AB (ref >60)
Glucose, Bld: 82 mg/dL (ref 65–99)
POTASSIUM: 4.5 mmol/L (ref 3.5–5.1)
SODIUM: 134 mmol/L — AB (ref 135–145)

## 2017-10-29 LAB — MAGNESIUM: MAGNESIUM: 1.7 mg/dL (ref 1.7–2.4)

## 2017-10-29 MED ORDER — LOPERAMIDE HCL 2 MG PO CAPS
2.0000 mg | ORAL_CAPSULE | Freq: Three times a day (TID) | ORAL | Status: DC
  Administered 2017-10-29 – 2017-11-03 (×17): 2 mg via ORAL
  Filled 2017-10-29 (×17): qty 1

## 2017-10-29 MED ORDER — PANTOPRAZOLE SODIUM 40 MG PO TBEC
40.0000 mg | DELAYED_RELEASE_TABLET | Freq: Two times a day (BID) | ORAL | Status: DC
  Administered 2017-10-29 – 2017-11-03 (×11): 40 mg via ORAL
  Filled 2017-10-29 (×11): qty 1

## 2017-10-29 MED ORDER — PSYLLIUM 95 % PO PACK
1.0000 | PACK | Freq: Every day | ORAL | Status: DC
  Administered 2017-10-29 – 2017-10-30 (×2): 1 via ORAL
  Filled 2017-10-29 (×2): qty 1

## 2017-10-29 NOTE — Progress Notes (Signed)
  Oncology Nurse Navigator Documentation  Navigator Location: [facility] (10/29/17 1616)   )                      Patient Visit Type: Inpatient (10/29/17 1616)       Interventions: Psycho-social support (10/29/17 1616)  Met with patient on unit. Patient is upbeat and looking forward to discharge. Patient did state that she apprehensive about starting chemo. I encouraged patient that she would be given nausea medications prior to chemo and would have PRN meds to take at home. Encouragement and support offered.          Acuity: Level 2 (10/29/17 1616)   Acuity Level 2: Ongoing guidance and education throughout treatment as needed (10/29/17 1616)     Time Spent with Patient: 30 (10/29/17 1616)

## 2017-10-29 NOTE — Progress Notes (Signed)
[facility] Progress Note     Subjective: CC: high ileostomy output Patient states she is still having high output from ileostomy. Tolerating diet and has a good appetite. Felt nauseated yesterday, improving. Ambulating. No abdominal pain but is feeling a little sore around stoma from having to have ostomy appliance changed multiple times yesterday.  UOP good. BP soft but stable.   Objective: Vital signs in last 24 hours: Temp:  [97.6 F (36.4 C)-97.9 F (36.6 C)] 97.6 F (36.4 C) (12/17 0443) Pulse Rate:  [67-79] 70 (12/17 0443) Resp:  [17-18] 18 (12/17 0443) BP: (100-105)/(43-55) 105/55 (12/17 0443) SpO2:  [95 %-100 %] 100 % (12/17 0443) Last BM Date: 10/28/17  Intake/Output from previous day: 12/16 0701 - 12/17 0700 In: 2736.7 [P.O.:720; I.V.:2016.7] Out: 2225 [Urine:500; HCWCB:7628] Intake/Output this shift: No intake/output data recorded.  PE: Gen:  Alert, NAD, pleasant Card:  Regular rate and rhythm, pedal pulses 2+ BL Pulm:  Normal effort, clear to auscultation bilaterally Abd: Soft, non-tender, non-distended, bowel sounds present, midline incision well healed, stoma pink and ostomy with moderate green liquid stool.  Skin: warm and dry, no rashes  Psych: A&Ox3   Lab Results:  Recent Labs    10/27/17 0708 10/28/17 0404  WBC 11.9* 13.1*  HGB 9.6* 9.8*  HCT 29.5* 29.7*  PLT 240 263   BMET Recent Labs    10/28/17 0404 10/29/17 0354  NA 133* 134*  K 4.6 4.5  CL 109 110  CO2 17* 19*  GLUCOSE 82 82  BUN 23* 22*  CREATININE 0.97 1.02*  CALCIUM 8.0* 8.0*   PT/INR No results for input(s): LABPROT, INR in the last 72 hours. CMP     Component Value Date/Time   NA 134 (L) 10/29/2017 0354   NA 120 (L) 10/24/2017 1155   K 4.5 10/29/2017 0354   K 5.8 (H) 10/24/2017 1155   CL 110 10/29/2017 0354   CO2 19 (L) 10/29/2017 0354   CO2 16 (L) 10/24/2017 1155   GLUCOSE 82 10/29/2017 0354   GLUCOSE 197 (H) 10/24/2017 1155   BUN 22 (H) 10/29/2017  0354   BUN 83.4 (H) 10/24/2017 1155   CREATININE 1.02 (H) 10/29/2017 0354   CREATININE 3.6 (HH) 10/24/2017 1155   CALCIUM 8.0 (L) 10/29/2017 0354   CALCIUM 10.1 10/24/2017 1155   PROT 5.1 (L) 10/28/2017 0404   PROT 8.3 10/24/2017 1155   ALBUMIN 2.0 (L) 10/28/2017 0404   ALBUMIN 3.0 (L) 10/24/2017 1155   AST 16 10/28/2017 0404   AST 31 10/24/2017 1155   ALT 16 10/28/2017 0404   ALT 36 10/24/2017 1155   ALKPHOS 103 10/28/2017 0404   ALKPHOS 217 (H) 10/24/2017 1155   BILITOT 0.8 10/28/2017 0404   BILITOT 0.61 10/24/2017 1155   GFRNONAA 49 (L) 10/29/2017 0354   GFRAA 57 (L) 10/29/2017 0354   Lipase     Component Value Date/Time   LIPASE 17 09/16/2017 1507       Studies/Results: No results found.  Anti-infectives: Anti-infectives (From admission, onward)   None       Assessment/Plan Hx of TIA Hx of Crohn's Disease Hx of lung cancer with prior lobectomy Hypothyroidism HTN GERD Legally blind/macular degeneration Severe protein calorie malnutrition Stage 2 pressure injury  Metastatic SB adenocarcinoma with peritoneal carcinomatosis S/P ex lap with ileocecectomy with end ileostomy 09/24/17 Dr. Georgette Dover Dehydration secondary to high ileostomy output AKI - stool output >1.5 L yesterday - patient got magnesium yesterday which may be related, prior to this  stool output was slowing some - Na up to 134, Cr down to 1.02 - continue scheduled lomotil and loperamide - can try increasing loperamide to TID, if stool output slows too much can back off loperamide - question whether adding something like metamucil may be of any benefit  FEN: regular diet, IVF VTE: heparin, SCDs ID: no current abx  LOS: 5 days    Brigid Re , [facility] 10/29/2017, 8:06 AM Pager: [phone] Consults: [phone] Mon-Fri 7:00 am-4:30 pm Sat-Sun 7:00 am-11:30 am

## 2017-10-29 NOTE — Progress Notes (Signed)
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 Pt s/p exploratory laparotomy, ileocecectomy with end ileostomy on 09/24/17 for SBO secondary to metastatic SB adenocarcinoma with peritoneal carcinomatosis.     PT Comments    Progressing with mobility. Dyspnea 2/4. End of session: O2 97% RA, HR 111 bpm.    Follow Up Recommendations  No PT follow up;Supervision - Intermittent     Equipment Recommendations  None recommended by PT    Recommendations for Other Services       Precautions / Restrictions Precautions Precautions: Fall Restrictions Weight Bearing Restrictions: No    Mobility  Bed Mobility Overal bed mobility: Modified Independent             General bed mobility comments: oob with OT  Transfers Overall transfer level: Needs assistance Equipment used: None Transfers: Sit to/from Stand Sit to Stand: Supervision         General transfer comment: for safety/lines  Ambulation/Gait Ambulation/Gait assistance: Min guard Ambulation Distance (Feet): 1200 Feet Assistive device: (IV pole) Gait Pattern/deviations: Decreased stride length     General Gait Details: pt pushed IV pole, distance to tolerance-pt requested to make several laps. Mildly unsteady intermittently. Some issues with navigating environment with IV pole (likely due to impaired vision)   Stairs            Wheelchair Mobility    Modified Rankin (Stroke Patients Only)       Balance Overall balance assessment: No apparent balance deficits (not formally assessed)                                           Cognition Arousal/Alertness: Awake/alert Behavior During Therapy: WFL for tasks assessed/performed Overall Cognitive Status: Within Functional Limits for tasks assessed                                        Exercises      General Comments        Pertinent Vitals/Pain Pain Assessment: No/denies pain Pain Intervention(s): Monitored during session    Home Living                      Prior Function            PT Goals (current goals can now be found in the care plan section) Progress towards PT goals: Progressing toward goals    Frequency    Min 3X/week      PT Plan Current plan remains appropriate    Co-evaluation              AM-PAC PT "6 Clicks" Daily Activity  Outcome Measure  Difficulty turning over in bed (including adjusting bedclothes, sheets and blankets)?: None Difficulty moving from lying on back to sitting on the side of the bed? : None Difficulty sitting down on and standing up from a chair with arms (e.g., wheelchair, bedside commode, etc,.)?: None Help needed moving to and from a bed  to chair (including a wheelchair)?: None Help needed walking in hospital room?: A Little Help needed climbing 3-5 steps with a railing? : A Little 6 Click Score: 22    End of Session   Activity Tolerance: Patient tolerated treatment well Patient left: in chair;with call bell/phone within reach   PT Visit Diagnosis: Difficulty in walking, not elsewhere classified (R26.2)     Time: 5056-9794 PT Time Calculation (min) (ACUTE ONLY): 27 min  Charges:  $Gait Training: 8-22 mins                    G Codes:        Terri [name], MPT Pager: [phone]

## 2017-10-29 NOTE — Consult Note (Signed)
[place] Nurse ostomy follow up Stoma type/location: Ileostomy Stomal assessment/size: 7/8 inches x 1 and 1/8 inch oval, slightly budded, moist Last pouching system applied by RN lasted 21 hours.  Is need of paste; I am to obtain a new tube (standard size) tomorrow and will bring to room. Bedside RN Karna Christmas is to be commended for her patience and expertise in pouch application. Complex pouching system for patient, family and staff, including this Probation officer. Stool thickening agents are continued with moderate results.  [place] nursing team will follow, and will remain available to this patient, the nursing, srugical and medical teams.   Thanks, Maudie Flakes, MSN, RN, [place], Arther Abbott  Pager# [phone]

## 2017-10-29 NOTE — Progress Notes (Signed)
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 Pt s/p exploratory laparotomy, ileocecectomy with end ileostomy on 09/24/17 for SBO secondary to metastatic SB adenocarcinoma with peritoneal carcinomatosis.    OT comments  Pt making good progress with functional goals. OT will continue to follow acutely  Follow Up Recommendations  Supervision/Assistance - 24 hour    Equipment Recommendations  None recommended by OT    Recommendations for Other Services      Precautions / Restrictions Precautions Precautions: Fall Restrictions Weight Bearing Restrictions: No       Mobility Bed Mobility Overal bed mobility: Modified Independent             General bed mobility comments: oob with OT  Transfers Overall transfer level: Needs assistance Equipment used: None Transfers: Sit to/from Stand Sit to Stand: Supervision         General transfer comment: for safety/lines    Balance Overall balance assessment: No apparent balance deficits (not formally assessed)                                         ADL either performed or assessed with clinical judgement   ADL Overall ADL's : Needs assistance/impaired Eating/Feeding: Independent;Sitting   Grooming: Standing;Supervision/safety       Lower Body Bathing: Moderate assistance;Sit to/from stand Lower Body Bathing Details (indicate cue type and reason): simulated         Toilet Transfer: Ambulation;Comfort height toilet;Supervision/safety           Functional mobility during ADLs:  Supervision/safety       Vision Patient Visual Report: No change from baseline     Perception     Praxis      Cognition Arousal/Alertness: Awake/alert Behavior During Therapy: WFL for tasks assessed/performed Overall Cognitive Status: Within Functional Limits for tasks assessed                                          Exercises     Shoulder Instructions       General Comments  pt very pleasant and cooperative    Pertinent Vitals/ Pain       Pain Assessment: No/denies pain Pain Intervention(s): Monitored during session  Home Living                                          Prior Functioning/Environment              Frequency  Min 2X/week        Progress Toward Goals  OT Goals(current goals can now be found in the care plan section)  Progress towards OT goals: Progressing toward goals  Acute Rehab OT Goals Patient Stated Goal: home  Plan Discharge plan remains appropriate    Co-evaluation                 AM-PAC PT "  6 Clicks" Daily Activity     Outcome Measure   Help from another person eating meals?: None Help from another person taking care of personal grooming?: A Little Help from another person toileting, which includes using toliet, bedpan, or urinal?: A Little Help from another person bathing (including washing, rinsing, drying)?: A Lot Help from another person to put on and taking off regular upper body clothing?: A Little Help from another person to put on and taking off regular lower body clothing?: A Lot 6 Click Score: 17    End of Session Equipment Utilized During Treatment: Gait belt  OT Visit Diagnosis: Unsteadiness on feet (R26.81);Muscle weakness (generalized) (M62.81)   Activity Tolerance Patient tolerated treatment well   Patient Left with call bell/phone within reach;in chair;Other (comment)(with PT)   Nurse Communication      Functional Assessment Tool Used: AM-PAC 6 Clicks Daily  Activity   Time: 1324-4010 OT Time Calculation (min): 16 min  Charges: OT G-codes **NOT FOR INPATIENT CLASS** Functional Assessment Tool Used: AM-PAC 6 Clicks Daily Activity OT General Charges $OT Visit: 1 Visit OT Treatments $Self Care/Home Management : 8-22 mins     Emmit Alexanders [facility] 10/29/2017, 10:13 AM

## 2017-10-29 NOTE — Progress Notes (Signed)
[facility] PROGRESS NOTE  Terri [name] HDQ:[id number] DOB: 06/28/1931 DOA: 10/24/2017 PCP: Prince Solian, MD  Interim summary and HPI 84 y.o. 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  Patient came to oncology clinic as a regular follow-up.  Patient reported increased output from ileal conduit requiring every half an hour to 1 hour to empty.  She has poor oral intake associated with nausea, not feeling well and generalized weakness.  She was treated with oral azithromycin for a week for upper respiratory infection.  She completed antibiotics last week.  Still having some cough.  In the oncology clinic, patient was found to have BUN 80, creatinine of 3.6, serum sodium level of 120, potassium 5.8.   Brought in for high output ostomy, treated conservatively right now general surgery consulted.  Output still elevated.  Assessment/Plan: 1-acute kidney injury: with presumed ATN.  Resolved -significant improvement with IVF's -will continue minimizing/avoiding nephrotoxic agents   -maintain adequate hydration  -follow BMET in am   2-hypovolemic hyponatremia: Resolved -in setting of increase GI loses from ostomy -continue IVF's; but rate adjusted now -continue imodium and lomotil to slow down GI transit  -follow electrolytes trend and replete as needed  -no neurologic deficit appreciated -Na up to 130 now.  3-high output ileostomy -continue PRN imodium and lomotil (added 12/13) -stool consistency is changing and patient reported slight improvement in quantity  -continue to follow electrolytes and replete them as needed  -wound care/ostomy service to guide regarding care; bag continue to blowout and have trouble maintaining  sealing integrity.  -Scheduled Imodium added today. -IV magnesium will not cause output from ileostomy to increase. -On my review of literature oral intake of fluid is recommended to be restricted in patients with high output ostomy which is what I recommended to the patient.  4-severe protein calorie malnutrition  -with over 32 pounds weight loss in the last month -nutritional service on board; will follow rec's for feeding supplements   5-hx of small bowel carcinoma  -s/p ileostomy -will follow CCS rec's from post surgical rec's -oncology also on board; will follow any rec's  6-hx of hypothyroidism -TSH WNL -will continue synthroid   7-hx of TIA -stable and no focal deficit seen  -continue plavix for secondary prevention   8-hx of crohn's disease -continue mesalamine and prednisone  -no abd pain, hematochezia or melena   9-stage 2 pressure injury -continue preventive measures and constant repositioning -continue barrier cream as needed.  Code Status: DNR Family Communication: no family at bedside  Disposition Plan: Ileostomy remains with high output.  Will continue lomotil and continue scheduled Imodium. Follow CCS recommendations and wound care/ostomy service instructions.   Consultants:  Oncology  CCS  Procedures:  See below for x-ray reports   Antibiotics:  None   HPI/Subjective: Feeling better, no nausea no vomiting.  No abdominal pain. Objective: Vitals:   10/29/17 0443 10/29/17 1400  BP: (!) 105/55 (!) 120/57  Pulse: 70 71  Resp: 18 18  Temp: 97.6 F (36.4 C) 98.9 F (37.2 C)  SpO2: 100% 100%    Intake/Output Summary (Last 24 hours) at 10/29/2017 2006 Last data filed at 10/29/2017 1900 Gross per 24 hour  Intake 2578.33 ml  Output 2305 ml  Net 273.33 ml   Filed Weights   10/24/17 1600 10/25/17 0357 10/28/17 1194  Weight: 65.5 kg (144 lb 6.4 oz) 65.2 kg (143 lb 11.8 oz) 70.9 kg (156 lb 4.9 oz)    Exam:   General: no fever, no CP, no  nausea, no vomiting. Patient continue to have intermittent blowout from ostomy bag, but overall reported stool to be less loose and to have less BM overall.   Cardiovascular: S1 and S2, no rubs, no gallops, positive SEM.   Respiratory: good air movement, no wheezing, no crackles. Positive scattered rhonchi.   Abdomen: soft, NT, ND, positive BS, ileostomy full and with active leakage during examination, surrounding erythema appreciated.  Musculoskeletal: no edema, no cyanosis, no clubbing  Skin: stage 2 in coccyx area appreciated, foam dressing in place.  Data Reviewed: Basic Metabolic Panel: Recent Labs  Lab 10/25/17 1640 10/26/17 0300 10/27/17 0708 10/28/17 0404 10/29/17 0354  NA 126* 130* 132* 133* 134*  K 5.0 4.9 4.4 4.6 4.5  CL 100* 105 106 109 110  CO2 20* 18* 19* 17* 19*  GLUCOSE 129* 97 92 82 82  BUN 59* 47* 28* 23* 22*  CREATININE 1.78* 1.50* 1.23* 0.97 1.02*  CALCIUM 8.2* 8.5* 8.1* 8.0* 8.0*  MG  --   --   --  1.0* 1.7   Liver Function Tests: Recent Labs  Lab 10/24/17 1155 10/28/17 0404  AST 31 16  ALT 36 16  ALKPHOS 217* 103  BILITOT 0.61 0.8  PROT 8.3 5.1*  ALBUMIN 3.0* 2.0*   CBC: Recent Labs  Lab 10/24/17 1155 10/25/17 0454 10/27/17 0708 10/28/17 0404  WBC 17.3* 14.8* 11.9* 13.1*  NEUTROABS 14.0*  --   --  7.5  HGB 13.7 11.9* 9.6* 9.8*  HCT 42.1 34.8* 29.5* 29.7*  MCV 85.0 83.1 85.3 85.8  PLT 312 245 240 263   Recent Results (from the past 240 hour(s))  TECHNOLOGIST REVIEW     Status: None   Collection Time: 10/24/17 11:55 AM  Result Value Ref Range Status   Technologist Review Rare metamyelocte   Final  MRSA PCR Screening     Status: None   Collection Time: 10/24/17  2:55 PM  Result Value Ref Range Status   MRSA by PCR NEGATIVE NEGATIVE Final    Comment:        The GeneXpert MRSA Assay (FDA approved for NASAL specimens only), is one component of a comprehensive MRSA colonization surveillance program. It is not intended to diagnose  MRSA infection nor to guide or monitor treatment for MRSA infections.      Studies: No results found.  Scheduled Meds: . clopidogrel  75 mg Oral Daily  . diphenoxylate-atropine  2 tablet Oral QID  . escitalopram  5 mg Oral Daily  . folic acid  1 mg Oral Daily  . heparin  5,000 Units Subcutaneous Q8H  . levothyroxine  100 mcg Oral QAC breakfast  . loperamide  2 mg Oral TID  . mesalamine  1,500 mg Oral BID  . multivitamin with minerals  1 tablet Oral Daily  . oxybutynin  5 mg Oral Daily  . pantoprazole  40 mg Oral BID AC  . predniSONE  10 mg Oral Q breakfast  . protein supplement shake  11 oz Oral Q24H  . psyllium  1 packet Oral Daily   Continuous Infusions: . sodium chloride 100 mL/hr at 10/29/17 1811    Time spent: 30 minutes  Author:  Berle Mull, MD [facility] Pager: [phone] 10/29/2017 8:06 PM     If 7PM-7AM, please contact night-coverage at [email or web address], password [facility]  10/29/2017, 8:06 PM  LOS: 5 days

## 2017-10-29 NOTE — Progress Notes (Signed)
Pt's stool this evening is more brown in color than green.  Pt's son expressed concern about keeping the pt on iv fluid at 100 cc/hr while here and then d/c home with none.

## 2017-10-30 ENCOUNTER — Telehealth: Payer: Self-pay | Admitting: Oncology

## 2017-10-30 LAB — BASIC METABOLIC PANEL
Anion gap: 3 — ABNORMAL LOW (ref 5–15)
BUN: 14 mg/dL (ref 6–20)
CALCIUM: 8.1 mg/dL — AB (ref 8.9–10.3)
CHLORIDE: 113 mmol/L — AB (ref 101–111)
CO2: 18 mmol/L — AB (ref 22–32)
CREATININE: 0.83 mg/dL (ref 0.44–1.00)
GFR calc Af Amer: >60 mL/min (ref >60)
GFR calc non Af Amer: >60 mL/min (ref >60)
Glucose, Bld: 76 mg/dL (ref 65–99)
Potassium: 4.6 mmol/L (ref 3.5–5.1)
SODIUM: 134 mmol/L — AB (ref 135–145)

## 2017-10-30 LAB — MAGNESIUM: Magnesium: 1.2 mg/dL — ABNORMAL LOW (ref 1.7–2.4)

## 2017-10-30 MED ORDER — MAGNESIUM SULFATE 50 % IJ SOLN
3.0000 g | Freq: Once | INTRAVENOUS | Status: AC
  Administered 2017-10-30: 3 g via INTRAVENOUS
  Filled 2017-10-30: qty 6

## 2017-10-30 MED ORDER — PSYLLIUM 95 % PO PACK
1.0000 | PACK | Freq: Two times a day (BID) | ORAL | Status: DC
  Administered 2017-10-30 – 2017-11-03 (×8): 1 via ORAL
  Filled 2017-10-30 (×8): qty 1

## 2017-10-30 NOTE — Progress Notes (Signed)
    CC: Weakness/dehydration/acute kidney injury    Subjective: She is feeling better, intake is still inadequate for output.  she is receiving:  Lomotil 2.5 mg 4 times daily, Imodium 2 mg twice daily Mesalamine 1500 mg twice daily Metamucil 1 packet daily Objective: Vital signs in last 24 hours: Temp:  [98 F (36.7 C)-98.9 F (37.2 C)] 98.4 F (36.9 C) (12/18 0534) Pulse Rate:  [64-72] 64 (12/18 0534) Resp:  [18] 18 (12/18 0534) BP: (116-120)/(50-58) 116/50 (12/18 0534) SpO2:  [100 %] 100 % (12/18 0534) Last BM Date: 10/30/17 920 PO 2366 IV Creatinine is back to baseline normal. Emesis x 1 recorded 1555 from ostomy Urine x 1 recorded Afebrile, VSS Labs stable  Intake/Output from previous day: 12/17 0701 - 12/18 0700 In: 3286.7 [P.O.:920; I.V.:2366.7] Out: 9798 [Stool:1555] Intake/Output this shift: Total I/O In: 420 [P.O.:420] Out: 225 [Stool:225]  General appearance: alert, cooperative and no distress GI: soft, non-tender; bowel sounds normal; no masses,  no organomegaly and Ostomy is working well.  Midline incision is well.  Lab Results:  Recent Labs    10/28/17 0404  WBC 13.1*  HGB 9.8*  HCT 29.7*  PLT 263    BMET Recent Labs    10/29/17 0354 10/30/17 0357  NA 134* 134*  K 4.5 4.6  CL 110 113*  CO2 19* 18*  GLUCOSE 82 76  BUN 22* 14  CREATININE 1.02* 0.83  CALCIUM 8.0* 8.1*   PT/INR No results for input(s): LABPROT, INR in the last 72 hours.  Recent Labs  Lab 10/24/17 1155 10/28/17 0404  AST 31 16  ALT 36 16  ALKPHOS 217* 103  BILITOT 0.61 0.8  PROT 8.3 5.1*  ALBUMIN 3.0* 2.0*     Lipase     Component Value Date/Time   LIPASE 17 09/16/2017 1507     Medications: . clopidogrel  75 mg Oral Daily  . diphenoxylate-atropine  2 tablet Oral QID  . escitalopram  5 mg Oral Daily  . folic acid  1 mg Oral Daily  . heparin  5,000 Units Subcutaneous Q8H  . levothyroxine  100 mcg Oral QAC breakfast  . loperamide  2 mg Oral TID  .  mesalamine  1,500 mg Oral BID  . multivitamin with minerals  1 tablet Oral Daily  . oxybutynin  5 mg Oral Daily  . pantoprazole  40 mg Oral BID AC  . predniSONE  10 mg Oral Q breakfast  . protein supplement shake  11 oz Oral Q24H  . 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 Donnie Mesa -History of recurrent small bowel obstruction/Crohn's disease/hospitalized 11/4-11/18/18.  Atrial fibrillation with rapid ventricular rate History of hypertension History of hypothyroidism History of lung cancer and prior lobectomy History of tobacco use Mild AR, trivial PR, mild TR. GERD/history of duodenal ulcers Legally blind/macular degeneration Malnutrition History of TIAs.  FEN:  IV fluids/regular diet ID: none DVT:  SCD Foley:   Follow-up:  Dr. Georgette Dover  Plan: Increase Metamucil to twice daily.  We may need to consider PICC line and IV fluids at home if we cannot keep her adequately hydrated with just oral intake.  Continue to try and titrate her output with a bowel regime.        LOS: 6 days    Declynn Lopresti 10/30/2017 [phone]

## 2017-10-30 NOTE — Progress Notes (Signed)
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    Assessment & Plan:   Active Problems:   AKI (acute kidney injury) ([place])   Dehydration   Hyponatremia   Cough   Pressure injury of skin   AKI probably sec to dehydration vs ATN.  Hydrate and repeat creatinine in am.     hypovolemic hyponatremia:  Improved with hydration.  Possibly from GI losses from ostomy.    High output ileostomy: Prn imodium .  Improving consistency of the stool.  Wound care/ ostomy care   Hypo magnesium: Replaced.    Severe protein calorie malnutrition: Nutrition consult.    Small bowel carcinoma: S/p ileostomy.  Oncology on board.    H/o TIA:  On plavix     DVT prophylaxis: lovenox.  Code Status: (dnr Family Communication: (none at bedside Disposition Plan: home tomorrow   Consultants:   Dr Learta Codding.    Procedures: none.   Antimicrobials: none.   Subjective: No new complaints.  Objective: Vitals:   10/29/17 1400 10/29/17 2132 10/30/17 0534 10/30/17 1400  BP: (!) 120/57 (!) 119/58 (!) 116/50 (!) 100/53  Pulse: 71 72 64 81  Resp: 18 18 18 18   Temp: 98.9 F (37.2 C) 98 F (36.7 C) 98.4 F (36.9 C) 99 F (37.2 C)  TempSrc: Oral Oral Oral Oral  SpO2: 100% 100% 100% 96%  Weight:      Height:        Intake/Output Summary (Last 24 hours) at 10/30/2017 1842 Last data filed at 10/30/2017 1554 Gross per 24 hour  Intake 2728.33 ml  Output 1500 ml  Net 1228.33 ml   Filed Weights   10/24/17 1600 10/25/17 0357 10/28/17 0614  Weight: 65.5 kg (144 lb 6.4 oz) 65.2 kg (143 lb 11.8 oz)  70.9 kg (156 lb 4.9 oz)    Examination:  General exam: Appears calm and comfortable  Respiratory system: Clear to auscultation. Respiratory effort normal. Cardiovascular system: S1 & S2 heard, RRR. No JVD, murmurs, rubs, gallops or clicks. No pedal edema. Gastrointestinal system: Abdomen is nondistended, soft and nontender. No organomegaly or masses felt. Normal bowel sounds heard. Central nervous system: Alert and oriented. No focal neurological deficits. Extremities: Symmetric 5 x 5 power. Skin: No rashes, lesions or ulcers Psychiatry: Judgement and insight appear normal. Mood & affect appropriate.     Data Reviewed: I have personally reviewed following labs and imaging studies  CBC: Recent Labs  Lab 10/24/17 1155 10/25/17 0454 10/27/17 0708 10/28/17 0404  WBC 17.3* 14.8* 11.9* 13.1*  NEUTROABS 14.0*  --   --  7.5  HGB 13.7 11.9* 9.6* 9.8*  HCT 42.1 34.8* 29.5* 29.7*  MCV 85.0 83.1 85.3 85.8  PLT 312 245 240 902   Basic Metabolic Panel: Recent Labs  Lab 10/26/17 0300 10/27/17 0708 10/28/17 0404 10/29/17 0354 10/30/17 0357  NA 130* 132* 133* 134* 134*  K 4.9 4.4 4.6 4.5 4.6  CL 105 106 109 110 113*  CO2 18* 19* 17* 19* 18*  GLUCOSE 97 92 82 82 76  BUN 47* 28* 23* 22* 14  CREATININE 1.50* 1.23* 0.97 1.02* 0.83  CALCIUM 8.5* 8.1* 8.0* 8.0* 8.1*  MG  --   --  1.0* 1.7 1.2*   GFR: Estimated Creatinine Clearance: 46.5 mL/min (by C-G formula based on SCr of 0.83 mg/dL). Liver Function Tests: Recent Labs  Lab 10/24/17 1155 10/28/17 0404  AST 31 16  ALT 36 16  ALKPHOS 217* 103  BILITOT 0.61 0.8  PROT 8.3 5.1*  ALBUMIN 3.0* 2.0*   No results for input(s): LIPASE, AMYLASE in the last 168 hours. No results for input(s): AMMONIA in the last 168 hours. Coagulation Profile: No results for input(s): INR, PROTIME in the last 168 hours. Cardiac Enzymes: No results for input(s): CKTOTAL, CKMB, CKMBINDEX, TROPONINI in the last 168 hours. BNP (last 3 results) No  results for input(s): PROBNP in the last 8760 hours. HbA1C: No results for input(s): HGBA1C in the last 72 hours. CBG: No results for input(s): GLUCAP in the last 168 hours. Lipid Profile: No results for input(s): CHOL, HDL, LDLCALC, TRIG, CHOLHDL, LDLDIRECT in the last 72 hours. Thyroid Function Tests: No results for input(s): TSH, T4TOTAL, FREET4, T3FREE, THYROIDAB in the last 72 hours. Anemia Panel: No results for input(s): VITAMINB12, FOLATE, FERRITIN, TIBC, IRON, RETICCTPCT in the last 72 hours. Sepsis Labs: No results for input(s): PROCALCITON, LATICACIDVEN in the last 168 hours.  Recent Results (from the past 240 hour(s))  TECHNOLOGIST REVIEW     Status: None   Collection Time: 10/24/17 11:55 AM  Result Value Ref Range Status   Technologist Review Rare metamyelocte   Final  MRSA PCR Screening     Status: None   Collection Time: 10/24/17  2:55 PM  Result Value Ref Range Status   MRSA by PCR NEGATIVE NEGATIVE Final    Comment:        The GeneXpert MRSA Assay (FDA approved for NASAL specimens only), is one component of a comprehensive MRSA colonization surveillance program. It is not intended to diagnose MRSA infection nor to guide or monitor treatment for MRSA infections.          Radiology Studies: No results found.      Scheduled Meds: . clopidogrel  75 mg Oral Daily  . diphenoxylate-atropine  2 tablet Oral QID  . escitalopram  5 mg Oral Daily  . folic acid  1 mg Oral Daily  . heparin  5,000 Units Subcutaneous Q8H  . levothyroxine  100 mcg Oral QAC breakfast  . loperamide  2 mg Oral TID  . mesalamine  1,500 mg Oral BID  . multivitamin with minerals  1 tablet Oral Daily  . oxybutynin  5 mg Oral Daily  . pantoprazole  40 mg Oral BID AC  . predniSONE  10 mg Oral Q breakfast  . protein supplement shake  11 oz Oral Q24H  . psyllium  1 packet Oral BID   Continuous Infusions: . sodium chloride 100 mL/hr at 10/30/17 1400     LOS: 6 days    Time  spent: 35 minutes.     Hosie Poisson, MD [facility] Pager [phone]  If 7PM-7AM, please contact night-coverage [email or web address] Password [facility] 10/30/2017, 6:42 PM

## 2017-10-30 NOTE — Progress Notes (Addendum)
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 Pt s/p exploratory laparotomy, ileocecectomy with end ileostomy on 09/24/17 for SBO secondary to metastatic SB adenocarcinoma with peritoneal carcinomatosis.    OT comments  Pt performed ADL with set up/supervision for most of it.  She wants HHOT at home to help her determine where to place grab bars in her bathroom as well as to increase independence with IADLs. She expects to have a HHRN for IV.   Follow Up Recommendations  Home health OT    Equipment Recommendations  None recommended by OT    Recommendations for Other Services      Precautions / Restrictions Precautions Precautions: Fall Restrictions Weight Bearing Restrictions: No       Mobility Bed Mobility Overal bed mobility: Modified Independent                Transfers   Equipment used: None   Sit to Stand: Supervision              Balance Overall balance assessment: No apparent balance deficits (not formally assessed)                                         ADL either performed or assessed with clinical judgement   ADL       Grooming: Standing;Supervision/safety   Upper Body Bathing: Set up;Supervision/ safety;Standing   Lower Body Bathing: Supervison/ safety;Set up;Sit to/from stand   Upper Body Dressing : Min guard;Sitting(for lines)   Lower Body Dressing: Set up;Supervision/safety;Sit to/from stand   Toilet Transfer: Supervision/safety;Ambulation;Comfort height toilet;Grab bars;RW    Toileting- Clothing Manipulation and Hygiene: Supervision/safety;Sit to/from stand         General ADL Comments: pt performed adl in bathroom.       Vision       Perception     Praxis      Cognition Arousal/Alertness: Awake/alert Behavior During Therapy: WFL for tasks assessed/performed Overall Cognitive Status: Within Functional Limits for tasks assessed                                          Exercises     Shoulder Instructions       General Comments      Pertinent Vitals/ Pain       Pain Assessment: No/denies pain  Home Living                                          Prior Functioning/Environment              Frequency           Progress Toward Goals  OT Goals(current goals can now be found in the care plan section)  Progress towards OT goals: Goals met and updated - see care plan  ADL Goals Pt Will Transfer to Toilet: with  modified independence;ambulating;grab bars Pt Will Perform Toileting - Clothing Manipulation and hygiene: with modified independence;sit to/from stand Pt Will Perform Tub/Shower Transfer: Shower transfer;with supervision;ambulating;shower seat Additional ADL Goal #1: pt will perform ADL at mod I level, initiating at least one rest break for energy conservation  Plan      Co-evaluation                 AM-PAC PT "6 Clicks" Daily Activity     Outcome Measure   Help from another person eating meals?: None Help from another person taking care of personal grooming?: A Little Help from another person toileting, which includes using toliet, bedpan, or urinal?: A Little Help from another person bathing (including washing, rinsing, drying)?: A Little Help from another person to put on and taking off regular upper body clothing?: A Little Help from another person to put on and taking off regular lower body clothing?: A Little 6 Click Score: 19    End of Session    OT Visit  Diagnosis: Unsteadiness on feet (R26.81);Muscle weakness (generalized) (M62.81)   Activity Tolerance Patient tolerated treatment well   Patient Left with call bell/phone within reach;in chair;Other (comment)   Nurse Communication          Time: 1610-9604 OT Time Calculation (min): 25 min  Charges: OT General Charges $OT Visit: 1 Visit OT Treatments $Self Care/Home Management : 23-37 mins  Lesle Chris, OTR/L [phone] 10/30/2017   Terri [name] 10/30/2017, 3:37 PM

## 2017-10-30 NOTE — Telephone Encounter (Signed)
Patient is in the hospital

## 2017-10-30 NOTE — Care Management Important Message (Signed)
Important Message  Patient Details  Name: Terri [name] MRN: [id number] Date of Birth: Feb 13, 1931   Medicare Important Message Given:  Yes    Kerin Salen 10/30/2017, 10:07 AMImportant Message  Patient Details  Name: Terri [name] MRN: [id number] Date of Birth: [birth year]   Medicare Important Message Given:  Yes    Kerin Salen 10/30/2017, 10:07 AM

## 2017-10-30 NOTE — Consult Note (Signed)
[facility] Nurse ostomy follow up Stoma type/location: Ileostomy Pouch has just been applied by Bedside RN Terri using a small amount of paste that the patient's son brought in from home. Full sizzed tube of paste and a small, sample size tube of paste delivered to room.  Room supplied with 5 flat pouches, 1 bottle of Adapt Stoma Powder and paste now for subsequent pouch changes. Adaptation of bowel regimen is ongoing.   [place] nursing team will follow along while in house, and will remain available to this patient, the nursing, surgical and medical teams.   Thanks, Maudie Flakes, MSN, RN, [place], Arther Abbott  Pager# [phone]

## 2017-10-31 ENCOUNTER — Telehealth: Payer: Self-pay | Admitting: Oncology

## 2017-10-31 DIAGNOSIS — E440 Moderate protein-calorie malnutrition: Secondary | ICD-10-CM

## 2017-10-31 LAB — BASIC METABOLIC PANEL
ANION GAP: 5 (ref 5–15)
BUN: 15 mg/dL (ref 6–20)
CO2: 19 mmol/L — ABNORMAL LOW (ref 22–32)
Calcium: 8.1 mg/dL — ABNORMAL LOW (ref 8.9–10.3)
Chloride: 110 mmol/L (ref 101–111)
Creatinine, Ser: 0.86 mg/dL (ref 0.44–1.00)
Glucose, Bld: 73 mg/dL (ref 65–99)
Potassium: 4.5 mmol/L (ref 3.5–5.1)
SODIUM: 134 mmol/L — AB (ref 135–145)

## 2017-10-31 LAB — PHOSPHORUS: PHOSPHORUS: 1.6 mg/dL — AB (ref 2.5–4.6)

## 2017-10-31 LAB — MAGNESIUM: MAGNESIUM: 1.6 mg/dL — AB (ref 1.7–2.4)

## 2017-10-31 MED ORDER — OPIUM 10 MG/ML (1%) PO TINC
6.0000 mg | Freq: Two times a day (BID) | ORAL | Status: DC
  Administered 2017-10-31: 10 mg via ORAL
  Filled 2017-10-31: qty 1

## 2017-10-31 MED ORDER — MAGNESIUM SULFATE 2 GM/50ML IV SOLN
2.0000 g | Freq: Once | INTRAVENOUS | Status: AC
  Administered 2017-10-31: 2 g via INTRAVENOUS
  Filled 2017-10-31: qty 50

## 2017-10-31 MED ORDER — POTASSIUM & SODIUM PHOSPHATES 280-160-250 MG PO PACK: 1.0000 | PACK | Freq: Three times a day (TID) | ORAL | Status: DC

## 2017-10-31 MED ORDER — OPIUM 10 MG/ML (1%) PO TINC
6.0000 mg | Freq: Two times a day (BID) | ORAL | Status: DC
  Administered 2017-10-31 – 2017-11-01 (×3): 6 mg via ORAL
  Filled 2017-10-31 (×3): qty 1

## 2017-10-31 NOTE — Progress Notes (Signed)
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    Assessment & Plan:   Active Problems:   AKI (acute kidney injury) ([place])   Dehydration   Hyponatremia   Cough   Pressure injury of skin   Malnutrition of moderate degree   AKI probably sec to dehydration vs ATN.  Hydrate and repeat creatinine in am.  Creatinine is back to baseline.    hypovolemic hyponatremia:  Improved with hydration.  Possibly from GI losses from ostomy.  Sodium stable at 134   High output ileostomy: Prn imodium .  Improving consistency of the stool.  Wound care/ ostomy care.  Surgery on board added tincture of opium today.   Hypo magnesium: Replaced.   Hypophosphatemia IV phosphate will be supplemented   Severe protein calorie malnutrition: Nutrition consult.    Small bowel carcinoma: S/p ileostomy.  Oncology on board.    H/o TIA:  On plavix     DVT prophylaxis: lovenox.  Code Status: DNR Family Communication: (none at bedside) Disposition Plan: home tomorrow   Consultants:   SURGERY.    Procedures: none.   Antimicrobials: none.   Subjective: Reports she is still having ostomy output more than 1 L/day.  No nausea vomiting or abdominal pain no new complaints. Objective: Vitals:   10/30/17 0534 10/30/17 1400 10/31/17 0627 10/31/17 1500  BP: (!) 116/50 (!) 100/53 (!) 130/57 126/79  Pulse: 64 81 77 (!) 105  Resp: 18 18 18 18   Temp: 98.4 F (36.9 C) 99 F (37.2 C) 98.1 F (36.7 C) 98.8 F (37.1 C)  TempSrc: Oral Oral Oral Oral  SpO2: 100% 96% 99% 100%  Weight:   67.1 kg (147 lb 14.9  oz)   Height:        Intake/Output Summary (Last 24 hours) at 10/31/2017 1759 Last data filed at 10/31/2017 1400 Gross per 24 hour  Intake 3180 ml  Output 680 ml  Net 2500 ml   Filed Weights   10/25/17 0357 10/28/17 0614 10/31/17 0627  Weight: 65.2 kg (143 lb 11.8 oz) 70.9 kg (156 lb 4.9 oz) 67.1 kg (147 lb 14.9 oz)    Examination:  General exam: Appears calm and comfortable not in any kind of distress Respiratory system: Clear to auscultation. Respiratory effort normal.  No wheezing or rhonchi Cardiovascular system: S1 & S2 heard, RRR. No JVD,  No pedal edema. Gastrointestinal system: Emina soft, nontender nondistended ostomy looks good Central nervous system: Alert and oriented.  Nonfocal Extremities: Symmetric 5 x 5 power. Skin: No rashes, lesions or ulcers Psychiatry: . Mood & affect appropriate.     Data Reviewed: I have personally reviewed following labs and imaging studies  CBC: Recent Labs  Lab 10/25/17 0454 10/27/17 0708 10/28/17 0404  WBC 14.8* 11.9* 13.1*  NEUTROABS  --   --  7.5  HGB 11.9* 9.6* 9.8*  HCT 34.8* 29.5* 29.7*  MCV 83.1 85.3 85.8  PLT 245 240 315   Basic Metabolic Panel: Recent Labs  Lab 10/27/17 0708 10/28/17 0404 10/29/17 0354 10/30/17 0357 10/31/17 0447  NA 132* 133* 134* 134* 134*  K 4.4 4.6 4.5 4.6 4.5  CL 106 109 110 113* 110  CO2 19* 17* 19* 18* 19*  GLUCOSE 92 82 82 76 73  BUN 28* 23* 22* 14 15  CREATININE 1.23* 0.97 1.02* 0.83 0.86  CALCIUM 8.1* 8.0* 8.0* 8.1* 8.1*  MG  --  1.0* 1.7 1.2* 1.6*  PHOS  --   --   --   --  1.6*   GFR: Estimated Creatinine Clearance: 43.7 mL/min (by C-G formula based on SCr of 0.86 mg/dL). Liver Function Tests: Recent Labs  Lab 10/28/17 0404  AST 16  ALT 16  ALKPHOS 103  BILITOT 0.8  PROT 5.1*  ALBUMIN 2.0*   No results for input(s): LIPASE, AMYLASE in the last 168 hours. No results for input(s): AMMONIA in the last 168 hours. Coagulation Profile: No results for input(s): INR,  PROTIME in the last 168 hours. Cardiac Enzymes: No results for input(s): CKTOTAL, CKMB, CKMBINDEX, TROPONINI in the last 168 hours. BNP (last 3 results) No results for input(s): PROBNP in the last 8760 hours. HbA1C: No results for input(s): HGBA1C in the last 72 hours. CBG: No results for input(s): GLUCAP in the last 168 hours. Lipid Profile: No results for input(s): CHOL, HDL, LDLCALC, TRIG, CHOLHDL, LDLDIRECT in the last 72 hours. Thyroid Function Tests: No results for input(s): TSH, T4TOTAL, FREET4, T3FREE, THYROIDAB in the last 72 hours. Anemia Panel: No results for input(s): VITAMINB12, FOLATE, FERRITIN, TIBC, IRON, RETICCTPCT in the last 72 hours. Sepsis Labs: No results for input(s): PROCALCITON, LATICACIDVEN in the last 168 hours.  Recent Results (from the past 240 hour(s))  TECHNOLOGIST REVIEW     Status: None   Collection Time: 10/24/17 11:55 AM  Result Value Ref Range Status   Technologist Review Rare metamyelocte   Final  MRSA PCR Screening     Status: None   Collection Time: 10/24/17  2:55 PM  Result Value Ref Range Status   MRSA by PCR NEGATIVE NEGATIVE Final    Comment:        The GeneXpert MRSA Assay (FDA approved for NASAL specimens only), is one component of a comprehensive MRSA colonization surveillance program. It is not intended to diagnose MRSA infection nor to guide or monitor treatment for MRSA infections.          Radiology Studies: No results found.      Scheduled Meds: . clopidogrel  75 mg Oral Daily  . diphenoxylate-atropine  2 tablet Oral QID  . escitalopram  5 mg Oral Daily  . folic acid  1 mg Oral Daily  . heparin  5,000 Units Subcutaneous Q8H  . levothyroxine  100 mcg Oral QAC breakfast  . loperamide  2 mg Oral TID  . mesalamine  1,500 mg Oral BID  . multivitamin with minerals  1 tablet Oral Daily  . Opium  6 mg Oral BID  . oxybutynin  5 mg Oral Daily  . pantoprazole  40 mg Oral BID AC  . predniSONE  10 mg Oral Q  breakfast  . protein supplement shake  11 oz Oral Q24H  . psyllium  1 packet Oral BID   Continuous Infusions: . sodium chloride 100 mL/hr at 10/31/17 1400     LOS: 7 days    Time spent: 35 minutes.     Hosie Poisson, MD [facility] Pager [phone]  If 7PM-7AM, please contact night-coverage [email or web address] Password [email or web address] 10/31/2017, 5:59 PM

## 2017-10-31 NOTE — Telephone Encounter (Signed)
Talked with patient she was in the hospital and did not know when she was getting out so did not want to schedule follow with Dr. Benay Spice.

## 2017-10-31 NOTE — Progress Notes (Signed)
[facility] Progress Note     Subjective: CC: high stool output and leakage around ostomy bag Patient denies abdominal pain. Tolerating diet with good appetite.  Lomotil 2.5 mg 4 times daily, Imodium 2 mg twice daily Mesalamine 1500 mg twice daily Metamucil 1 packet twice daily   Objective: Vital signs in last 24 hours: Temp:  [98.1 F (36.7 C)-99 F (37.2 C)] 98.1 F (36.7 C) (12/19 0627) Pulse Rate:  [77-81] 77 (12/19 0627) Resp:  [18] 18 (12/19 0627) BP: (100-130)/(53-57) 130/57 (12/19 0627) SpO2:  [96 %-99 %] 99 % (12/19 0627) Weight:  [67.1 kg (147 lb 14.9 oz)] 67.1 kg (147 lb 14.9 oz) (12/19 0627) Last BM Date: 10/30/17  Intake/Output from previous day: 12/18 0701 - 12/19 0700 In: 3060 [P.O.:660; I.V.:2400] Out: 1355 [Stool:1355] Intake/Output this shift: No intake/output data recorded.  PE: Gen:  Alert, NAD, pleasant Card:  Regular rate and rhythm, pedal pulses 2+ BL Pulm:  Normal effort, clear to auscultation bilaterally Abd: Soft, non-tender, non-distended, bowel sounds present, midline incision well healed, stoma pink and ostomy with moderate green liquid stool.  Skin: warm and dry, no rashes  Psych: A&Ox3     Lab Results:  No results for input(s): WBC, HGB, HCT, PLT in the last 72 hours. BMET Recent Labs    10/30/17 0357 10/31/17 0447  NA 134* 134*  K 4.6 4.5  CL 113* 110  CO2 18* 19*  GLUCOSE 76 73  BUN 14 15  CREATININE 0.83 0.86  CALCIUM 8.1* 8.1*   PT/INR No results for input(s): LABPROT, INR in the last 72 hours. CMP     Component Value Date/Time   NA 134 (L) 10/31/2017 0447   NA 120 (L) 10/24/2017 1155   K 4.5 10/31/2017 0447   K 5.8 (H) 10/24/2017 1155   CL 110 10/31/2017 0447   CO2 19 (L) 10/31/2017 0447   CO2 16 (L) 10/24/2017 1155   GLUCOSE 73 10/31/2017 0447   GLUCOSE 197 (H) 10/24/2017 1155   BUN 15 10/31/2017 0447   BUN 83.4 (H) 10/24/2017 1155   CREATININE 0.86 10/31/2017 0447   CREATININE 3.6 (HH)  10/24/2017 1155   CALCIUM 8.1 (L) 10/31/2017 0447   CALCIUM 10.1 10/24/2017 1155   PROT 5.1 (L) 10/28/2017 0404   PROT 8.3 10/24/2017 1155   ALBUMIN 2.0 (L) 10/28/2017 0404   ALBUMIN 3.0 (L) 10/24/2017 1155   AST 16 10/28/2017 0404   AST 31 10/24/2017 1155   ALT 16 10/28/2017 0404   ALT 36 10/24/2017 1155   ALKPHOS 103 10/28/2017 0404   ALKPHOS 217 (H) 10/24/2017 1155   BILITOT 0.8 10/28/2017 0404   BILITOT 0.61 10/24/2017 1155   GFRNONAA >60 10/31/2017 0447   GFRAA >60 10/31/2017 0447   Lipase     Component Value Date/Time   LIPASE 17 09/16/2017 1507       Studies/Results: No results found.  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 Donnie Mesa -History of recurrent small bowel obstruction/Crohn's disease/hospitalized 11/4-11/18/18.  Atrial fibrillation with rapid ventricular rate History of hypertension History of hypothyroidism History of lung cancer and prior lobectomy History of tobacco use Mild AR, trivial PR, mild TR. GERD/history of duodenal ulcers Legally blind/macular degeneration Malnutrition History  of TIAs.  FEN:IV fluids/regular diet KO:ECXF DVT:SCD Foley:none Follow-up:Dr. Tsuei  Plan: Ileostomy output improving but still >1L. Add tincture of opium today 6 mg BID to try to decrease ileostomy output.     LOS: 7 days    Brigid Re , [facility] Surgery 10/31/2017, 7:59 AM Pager: [phone] Consults: [phone] Mon-Fri 7:00 am-4:30 pm Sat-Sun 7:00 am-11:30 am

## 2017-10-31 NOTE — Progress Notes (Signed)
PT Cancellation Note  Patient Details Name: Terri [name] MRN: [id number] DOB: Feb 13, 1931   Cancelled Treatment:     pt reports she walked earlier and now her ileostomy is leaking.  Pt also stated, she has been amb in hallway "by myself"  Will attempt later as schedule permits.     Rica Koyanagi  PTA [facility]  Acute  Rehab Pager      [phone]

## 2017-10-31 NOTE — Progress Notes (Signed)
Nutrition Follow-up  DOCUMENTATION CODES:   Non-severe (moderate) malnutrition in context of acute illness/injury  INTERVENTION:   -Continue Multivitamin with minerals daily - continue  Premier Protein daily, each supplement provides 160 kcal and 30 grams of protein.  -Continue yogurt for 2 PM snack every day. -RD will continue to monitor   NUTRITION DIAGNOSIS:   Unintentional weight loss related to acute illness, post-op healing, other (see comment)(high output ileostomy) as evidenced by percent weight loss.  -N/A  NEW NUTRITION DIAGNOSIS:   Moderate Malnutrition related to post-op healing, cancer and cancer related treatments, acute illness(ileostomy) as evidenced by percent weight loss, moderate fat depletion, mild muscle depletion.  GOAL:   Patient will meet greater than or equal to 90% of their needs  Meeting.  MONITOR:   PO intake, Supplement acceptance, Weight trends, Labs, I & O's  ASSESSMENT:   83 y.o. 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  Patient came to oncology clinic as a regular follow-up.  Patient reported increased output from ileal conduit requiring every half an hour to 1 hour to empty.  She has poor oral intake associated with nausea, not feeling well and generalized weakness.  She was treated with oral azithromycin for a week for upper respiratory infection.  She completed antibiotics last week.  Still having some cough.  Patient currently consuming 70-100% of meals with good appetite. Pt drinking Premier Protein.  With 8% wt loss x 1.5 months and results from NFPE, pt now meets criteria for moderate malnutrition.   Medications: Folic acid tablet daily, Imodium capsule TID, Protonix BID,  Multivitamin with minerals daily, Metamucil packet BID Labs reviewed: Low Na, Mg/Phos  NUTRITION - FOCUSED PHYSICAL EXAM:    Most Recent Value  Orbital Region  No depletion  Upper Arm Region  Moderate depletion  Thoracic and Lumbar Region  Unable to assess  Buccal Region  Mild depletion  Temple Region  No depletion  Clavicle Bone Region  Mild depletion  Clavicle and Acromion Bone Region  Mild depletion  Scapular Bone Region  Unable to assess  Dorsal Hand  Unable to assess  Patellar Region  Unable to assess  Anterior Thigh Region  Unable to assess  Posterior Calf Region  Unable to assess  Edema (RD Assessment)  Mild  Hair  Reviewed  Eyes  Reviewed  Mouth  Reviewed  Skin  Reviewed  Nails  Reviewed       Diet Order:  Diet regular Room service appropriate? Yes; Fluid consistency: Thin  EDUCATION NEEDS:   Education needs have been addressed  Skin:  Skin Assessment: Skin Integrity Issues: Skin Integrity Issues:: Stage II, Other (Comment) Stage II: coccyx Other: ileostomy with redness to surrounding area  Last BM:  12/19  Height:   Ht Readings from Last 1 Encounters:  10/24/17 5\' 2"  (1.575 m)    Weight:   Wt Readings from Last 1 Encounters:  10/31/17 147 lb 14.9 oz (67.1 kg)    Ideal Body Weight:  50 kg  BMI:  Body mass index is 27.06 kg/m.  Estimated Nutritional Needs:   Kcal:  8338-2505 (25-28 kcal/kg)  Protein:  60-70 grams  Fluid:  ileostomy output + 1 L/day  Clayton Bibles, MS, RD, LDN [place] Dietitian Pager: [phone] After Hours Pager: [phone]

## 2017-10-31 NOTE — Consult Note (Addendum)
[place] Nurse ostomy follow up Stoma type/location: RMQ ileostomy. Patient reports that MD will try tincture of opium today as a thickening agent. Pouch just applied is changed from horizontal to vertical lie so that patient has both capacity and ability to empty into toilet. Stomal assessment/size: slightly oval stoma approximately 1 inch (7/8 inches x 1 and 1/8 inch Peristomal assessment: peristomal MASD to 0.5cm circumferentially Treatment options for stomal/peristomal skin: powder/prep (cursting method). Ring of paste.  Creased 1-piece pouch. Output: yellow effluent with variable thickness.  Currently soft, grainy much. Ostomy pouching: 1pc.flat/flexible. Supplies in room (6 pouches, paste, powder, no-sting barrier wipes). Written instructions for pouch change procedure are taped to the [facility]. Education provided: None Enrolled patient in [employer] Discharge program: Yes, previously.  [facility] nursing team will follow, and will remain available to this patient, the nursing, surgical and medical teams.   Thanks, Maudie Flakes, MSN, RN, [place], Arther Abbott  Pager# [phone]

## 2017-11-01 DIAGNOSIS — C172 Malignant neoplasm of ileum: Secondary | ICD-10-CM

## 2017-11-01 LAB — BASIC METABOLIC PANEL
ANION GAP: 4 — AB (ref 5–15)
BUN: 16 mg/dL (ref 6–20)
CHLORIDE: 108 mmol/L (ref 101–111)
CO2: 20 mmol/L — AB (ref 22–32)
Calcium: 8.2 mg/dL — ABNORMAL LOW (ref 8.9–10.3)
Creatinine, Ser: 0.95 mg/dL (ref 0.44–1.00)
GFR calc non Af Amer: 53 mL/min — ABNORMAL LOW (ref >60)
Glucose, Bld: 85 mg/dL (ref 65–99)
Potassium: 4.5 mmol/L (ref 3.5–5.1)
Sodium: 132 mmol/L — ABNORMAL LOW (ref 135–145)

## 2017-11-01 LAB — PHOSPHORUS: PHOSPHORUS: 2.1 mg/dL — AB (ref 2.5–4.6)

## 2017-11-01 LAB — MAGNESIUM: Magnesium: 1.6 mg/dL — ABNORMAL LOW (ref 1.7–2.4)

## 2017-11-01 MED ORDER — DEXTROSE 5 % IV SOLN
3.0000 g | Freq: Once | INTRAVENOUS | Status: AC
  Administered 2017-11-01: 3 g via INTRAVENOUS
  Filled 2017-11-01: qty 2

## 2017-11-01 MED ORDER — IPRATROPIUM BROMIDE 0.02 % IN SOLN
RESPIRATORY_TRACT | Status: AC
  Filled 2017-11-01: qty 2.5

## 2017-11-01 MED ORDER — SODIUM PHOSPHATES 45 MMOLE/15ML IV SOLN
30.0000 mmol | Freq: Once | INTRAVENOUS | Status: AC
  Administered 2017-11-01: 30 mmol via INTRAVENOUS
  Filled 2017-11-01: qty 10

## 2017-11-01 NOTE — Consult Note (Signed)
[place] Nurse ostomy follow up Stoma type/location: RMQ ileostomy. Pouch is intact, however pt states it is not the one placed by [facility] yesterday.  That pouch leaked and two bedside nurses replaced with current pouch.  Patient states the drainage is thicker now even resembling stool. The pouch has just been emptied so none currently to observe. Patient just walked two laps around unit with PT and tolerated well.  Stomal assessment/size: see yesterday's note Peristomal assessment: See yesterday's note Output: It is thicker per patient. Supplies in room, instructions for changing pouch posted (5 pouches, paste, powder, no-sting barrier wipes). Education provided: None Enrolled patient in [employer] Discharge program: Yes, previously.  Fara Olden, RN-C, WTA-C, [place] [facility] Ost[facility] Care Associate

## 2017-11-01 NOTE — Progress Notes (Signed)
[facility] Progress Note     Subjective: CC: No new complaints Patient denies abdominal pain. Appetite good. She reports slightly thickened consistency of stool this AM.  UOP good. VSS.   Objective: Vital signs in last 24 hours: Temp:  [97.5 F (36.4 C)-98.8 F (37.1 C)] 97.5 F (36.4 C) (12/20 0556) Pulse Rate:  [65-105] 65 (12/20 0556) Resp:  [18] 18 (12/20 0556) BP: (118-128)/(47-79) 128/47 (12/20 0556) SpO2:  [100 %] 100 % (12/20 0556) Weight:  [67.1 kg (147 lb 14.9 oz)] 67.1 kg (147 lb 14.9 oz) (12/20 0500) Last BM Date: 10/31/17  Intake/Output from previous day: 12/19 0701 - 12/20 0700 In: 3228.3 [P.O.:1020; I.V.:2208.3] Out: 1000 [Stool:1000] Intake/Output this shift: No intake/output data recorded.  PE: Gen: Alert, NAD, pleasant Card: Regular rate and rhythm, pedal pulses 2+ BL Pulm: Normal effort, clear to auscultation bilaterally Abd: Soft, non-tender, non-distended, bowel sounds present, midline incision well healed, stoma pink and ostomy with moderate green liquid stool. Skin: warm and dry, no rashes  Psych: A&Ox3    Lab Results:  No results for input(s): WBC, HGB, HCT, PLT in the last 72 hours. BMET Recent Labs    10/31/17 0447 11/01/17 0432  NA 134* 132*  K 4.5 4.5  CL 110 108  CO2 19* 20*  GLUCOSE 73 85  BUN 15 16  CREATININE 0.86 0.95  CALCIUM 8.1* 8.2*   PT/INR No results for input(s): LABPROT, INR in the last 72 hours. CMP     Component Value Date/Time   NA 132 (L) 11/01/2017 0432   NA 120 (L) 10/24/2017 1155   K 4.5 11/01/2017 0432   K 5.8 (H) 10/24/2017 1155   CL 108 11/01/2017 0432   CO2 20 (L) 11/01/2017 0432   CO2 16 (L) 10/24/2017 1155   GLUCOSE 85 11/01/2017 0432   GLUCOSE 197 (H) 10/24/2017 1155   BUN 16 11/01/2017 0432   BUN 83.4 (H) 10/24/2017 1155   CREATININE 0.95 11/01/2017 0432   CREATININE 3.6 (HH) 10/24/2017 1155   CALCIUM 8.2 (L) 11/01/2017 0432   CALCIUM 10.1 10/24/2017 1155   PROT 5.1 (L)  10/28/2017 0404   PROT 8.3 10/24/2017 1155   ALBUMIN 2.0 (L) 10/28/2017 0404   ALBUMIN 3.0 (L) 10/24/2017 1155   AST 16 10/28/2017 0404   AST 31 10/24/2017 1155   ALT 16 10/28/2017 0404   ALT 36 10/24/2017 1155   ALKPHOS 103 10/28/2017 0404   ALKPHOS 217 (H) 10/24/2017 1155   BILITOT 0.8 10/28/2017 0404   BILITOT 0.61 10/24/2017 1155   GFRNONAA 53 (L) 11/01/2017 0432   GFRAA >60 11/01/2017 0432   Lipase     Component Value Date/Time   LIPASE 17 09/16/2017 1507       Studies/Results: No results found.  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 Donnie Mesa -History of recurrent small bowel obstruction/Crohn's disease/hospitalized 11/4-11/18/18.  Atrial fibrillation with rapid ventricular rate History of hypertension History of hypothyroidism History of lung cancer and prior lobectomy History of tobacco use Mild AR, trivial PR, mild TR. GERD/history of duodenal ulcers Legally blind/macular degeneration Malnutrition History of TIAs.  FEN:IV fluids/regular diet ME:QAST DVT:SCD Foley:none Follow-up:Dr. Georgette Dover  Plan: Ileostomy output improving, patient at 1L in 24 h.  Continue current management. Decrease IVF but stop with increase in Cr. Possibly home 24-48 h.     LOS: 8 days    Brigid Re , [facility] Surgery 11/01/2017, 7:56 AM Pager: [phone] Consults: [phone] Mon-Fri 7:00 am-4:30 pm Sat-Sun 7:00 am-11:30 am

## 2017-11-01 NOTE — Progress Notes (Signed)
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    Assessment & Plan:   Active Problems:   AKI (acute kidney injury) ([place])   Dehydration   Hyponatremia   Cough   Pressure injury of skin   Malnutrition of moderate degree   AKI probably sec to dehydration vs ATN.  Hydrate and repeat creatinine in am.  Creatinine is back to baseline.    hypovolemic hyponatremia:  Improved with hydration.  Possibly from GI losses from ostomy.  Sodium stable between 132 to 134.    High output ileostomy: Prn imodium .  Improving consistency of the stool.  Wound care/ ostomy care.  Surgery on board added tincture of opium and her stool consistency is better.  Her output is around 1 lit today and if by tomorrow it remains the same, will d/c her home.    Hypo magnesemia: Replaced.   Hypophosphatemia IV phosphate will be supplemented.  Repeat phos still low. Continue to replete and recheck in am.    Severe protein calorie malnutrition: Nutrition consult.    Small bowel carcinoma: S/p ileostomy.  No new complaints.    H/o TIA:  On plavix     DVT prophylaxis: lovenox.  Code Status: DNR Family Communication: (none at bedside) Disposition Plan: home tomorrow   Consultants:   SURGERY.    Procedures: none.   Antimicrobials: none.   Subjective: Ostomy output is 1lit today.  Her electrolytes improving.  Objective: Vitals:   10/31/17 2137 11/01/17 0500 11/01/17 0556 11/01/17 1359  BP: (!) 118/57  (!) 128/47 (!) 131/59  Pulse: 73  65 68  Resp: 18  18 18   Temp: 98.8 F (37.1 C)  (!) 97.5  F (36.4 C) 98.2 F (36.8 C)  TempSrc: Oral  Oral Oral  SpO2: 100%  100% 100%  Weight:  67.1 kg (147 lb 14.9 oz)    Height:        Intake/Output Summary (Last 24 hours) at 11/01/2017 1457 Last data filed at 11/01/2017 1359 Gross per 24 hour  Intake 2368.33 ml  Output 1425 ml  Net 943.33 ml   Filed Weights   10/28/17 0614 10/31/17 0627 11/01/17 0500  Weight: 70.9 kg (156 lb 4.9 oz) 67.1 kg (147 lb 14.9 oz) 67.1 kg (147 lb 14.9 oz)    Examination:  General exam: in good spirits. No new complaints.  Respiratory system:good air entry , no wheezing or rhonchi.  Cardiovascular system: S1 & S2 heard, RRR. No JVD,  No pedal edema. Gastrointestinal system: abd is soft non tender non distended bowel sounds good.  Central nervous system: Alert and oriented.  Nonfocal Extremities: Symmetric 5 x 5 power 1 + pedal edema.  Skin: No rashes, lesions or ulcers Psychiatry: . Mood & affect appropriate.     Data Reviewed: I have personally reviewed following labs and imaging studies  CBC: Recent Labs  Lab 10/27/17 0708 10/28/17 0404  WBC 11.9* 13.1*  NEUTROABS  --  7.5  HGB 9.6* 9.8*  HCT 29.5* 29.7*  MCV 85.3 85.8  PLT 240 998   Basic Metabolic Panel: Recent Labs  Lab 10/28/17 0404 10/29/17 0354 10/30/17 0357 10/31/17 0447 11/01/17 0432  NA 133* 134* 134* 134* 132*  K 4.6 4.5 4.6 4.5 4.5  CL 109 110 113* 110 108  CO2 17* 19* 18* 19* 20*  GLUCOSE 82 82 76 73 85  BUN 23* 22* 14 15 16   CREATININE 0.97 1.02* 0.83 0.86 0.95  CALCIUM 8.0* 8.0* 8.1* 8.1* 8.2*  MG 1.0* 1.7 1.2* 1.6* 1.6*  PHOS  --   --   --  1.6* 2.1*   GFR: Estimated Creatinine Clearance: 39.6 mL/min (by C-G formula based on SCr of 0.95 mg/dL). Liver Function Tests: Recent Labs  Lab 10/28/17 0404  AST 16  ALT 16  ALKPHOS 103  BILITOT 0.8  PROT 5.1*  ALBUMIN 2.0*   No results for input(s): LIPASE, AMYLASE in the last 168 hours. No results for input(s): AMMONIA in the last 168 hours. Coagulation  Profile: No results for input(s): INR, PROTIME in the last 168 hours. Cardiac Enzymes: No results for input(s): CKTOTAL, CKMB, CKMBINDEX, TROPONINI in the last 168 hours. BNP (last 3 results) No results for input(s): PROBNP in the last 8760 hours. HbA1C: No results for input(s): HGBA1C in the last 72 hours. CBG: No results for input(s): GLUCAP in the last 168 hours. Lipid Profile: No results for input(s): CHOL, HDL, LDLCALC, TRIG, CHOLHDL, LDLDIRECT in the last 72 hours. Thyroid Function Tests: No results for input(s): TSH, T4TOTAL, FREET4, T3FREE, THYROIDAB in the last 72 hours. Anemia Panel: No results for input(s): VITAMINB12, FOLATE, FERRITIN, TIBC, IRON, RETICCTPCT in the last 72 hours. Sepsis Labs: No results for input(s): PROCALCITON, LATICACIDVEN in the last 168 hours.  Recent Results (from the past 240 hour(s))  TECHNOLOGIST REVIEW     Status: None   Collection Time: 10/24/17 11:55 AM  Result Value Ref Range Status   Technologist Review Rare metamyelocte   Final  MRSA PCR Screening     Status: None   Collection Time: 10/24/17  2:55 PM  Result Value Ref Range Status   MRSA by PCR NEGATIVE NEGATIVE Final    Comment:        The GeneXpert MRSA Assay (FDA approved for NASAL specimens only), is one component of a comprehensive MRSA colonization surveillance program. It is not intended to diagnose MRSA infection nor to guide or monitor treatment for MRSA infections.          Radiology Studies: No results found.      Scheduled Meds: . clopidogrel  75 mg Oral Daily  . diphenoxylate-atropine  2 tablet Oral QID  . escitalopram  5 mg Oral Daily  . folic acid  1 mg Oral Daily  . heparin  5,000 Units Subcutaneous Q8H  . levothyroxine  100 mcg Oral QAC breakfast  . loperamide  2 mg Oral TID  . mesalamine  1,500 mg Oral BID  . multivitamin with minerals  1 tablet Oral Daily  . Opium  6 mg Oral BID  . oxybutynin  5 mg Oral Daily  . pantoprazole  40 mg Oral BID  AC  . predniSONE  10 mg Oral Q breakfast  . protein supplement shake  11 oz Oral Q24H  . psyllium  1 packet Oral BID   Continuous Infusions: . sodium chloride 50 mL/hr at 11/01/17 0910  . magnesium sulfate 1 - 4 g bolus IVPB    . sodium phosphate  Dextrose 5% IVPB 30 mmol (11/01/17 1052)     LOS: 8 days    Time spent: 35 minutes.     Hosie Poisson, MD [facility] Pager [phone]  If 7PM-7AM, please contact night-coverage [email or web address]  Password [email or web address] 11/01/2017, 2:57 PM

## 2017-11-01 NOTE — Progress Notes (Signed)
IP PROGRESS NOTE  Subjective:   Terri [name] was admitted with severe dehydration secondary to a high output ileostomy.  She reports feeling much better.  The stool is becoming more thick and she has noted decreased output over the past 24 hours.  She is eating.  Objective: Vital signs in last 24 hours: Blood pressure (!) 131/59, pulse 68, temperature 98.2 F (36.8 C), temperature source Oral, resp. rate 18, height 5\' 2"  (1.575 m), weight 147 lb 14.9 oz (67.1 kg), SpO2 100 %.  Intake/Output from previous day: 12/19 0701 - 12/20 0700 In: 3228.3 [P.O.:1020; I.V.:2208.3] Out: 1000 [Stool:1000]  Physical Exam:  HEENT: No thrush Abdomen: Nontender, semi-formed stool in the ileostomy bag Extremities: No leg edema    Lab Results: No results for input(s): WBC, HGB, HCT, PLT in the last 72 hours.  BMET Recent Labs    10/31/17 0447 11/01/17 0432  NA 134* 132*  K 4.5 4.5  CL 110 108  CO2 19* 20*  GLUCOSE 73 85  BUN 15 16  CREATININE 0.86 0.95  CALCIUM 8.1* 8.2*    Lab Results  Component Value Date   CEA1 6.49 (H) 10/24/2017     Medications: I have reviewed the patient's current medications.  Assessment/Plan: 1. 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 pT4, pN1, pM1 ? CT abdomen/pelvis 09/16/2017-high-grade small bowel obstruction with transition point at the terminal ileum, nodularity of the omentum ? Diffuse peritoneal implants noted at the time of exploratory laparotomy 09/24/2017  2. Crohn's disease 3. Macular degeneration 4. Aortic insufficiency 5. History of a TIA 6. History of a positive PPD 7. History of a DVT following knee meniscus surgery 8. History of "typhoid "fever 9. Hypothyroid 10. Severe dehydration secondary to high output ileostomy 10/24/2017-dehydration resolved, ileostomy output improved  Terri [name] appears much improved compared to  hospital admission.  She had severe dehydration secondary to a high output ileostomy.  Her creatinine has returned to baseline with intravenous hydration.  The ileostomy output appears to be slowing in the stool is thicker today.  She has metastatic small bowel carcinoma.  We discussed treatment options.  She would like to consider a trial of systemic chemotherapy.  I will see her in approximately 2 weeks to discuss chemotherapy options.  If the ostomy output is under control and she is well-hydrated we will consider a trial of single agent capecitabine or FOLFOX.  Recommendations: 1.  Continue medical therapy for the high output ileostomy as recommended by the surgical service 2.  Outpatient follow-up will be scheduled at the Cancer center for the week of 11/12/2017 3.  Please call oncology as needed.    LOS: 8 days   Betsy Coder, MD   11/01/2017, 4:35 PM

## 2017-11-01 NOTE — Progress Notes (Signed)
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 Pt s/p exploratory laparotomy, ileocecectomy with end ileostomy on 09/24/17 for SBO secondary to metastatic SB adenocarcinoma with peritoneal carcinomatosis.     PT Comments    Progressing well with mobility. Pt reports she has been ambulating some on her own as well.    Follow Up Recommendations  No PT follow up;Supervision - Intermittent     Equipment Recommendations  None recommended by PT    Recommendations for Other Services       Precautions / Restrictions Precautions Precautions: Fall Restrictions Weight Bearing Restrictions: No    Mobility  Bed Mobility Overal bed mobility: Modified Independent                Transfers Overall transfer level: Modified independent                  Ambulation/Gait Ambulation/Gait assistance: Supervision;Min guard Ambulation Distance (Feet): 2000 Feet Assistive device: (IV pole) Gait Pattern/deviations: WFL(Within Functional Limits)     General Gait Details: Good gait speed. Pt tolerated distance. Requires supervision-min guard due to visual deficits   Stairs            Wheelchair Mobility    Modified Rankin (Stroke Patients Only)       Balance Overall balance assessment: Needs assistance           Standing balance-Leahy Scale: Fair                              Cognition Arousal/Alertness: Awake/alert Behavior During Therapy: WFL for tasks assessed/performed Overall Cognitive Status: Within Functional Limits for tasks  assessed                                        Exercises      General Comments        Pertinent Vitals/Pain Pain Assessment: No/denies pain    Home Living                      Prior Function            PT Goals (current goals can now be found in the care plan section) Progress towards PT goals: Progressing toward goals    Frequency    Min 3X/week      PT Plan Current plan remains appropriate    Co-evaluation              AM-PAC PT "6 Clicks" Daily Activity  Outcome Measure  Difficulty turning over in bed (including adjusting bedclothes, sheets and blankets)?: None Difficulty moving from lying on back to sitting on the side of the bed? : None Difficulty sitting down on and standing up from a chair with arms (e.g., wheelchair, bedside commode, etc,.)?: None Help needed moving to and from a bed to chair (including a wheelchair)?: None Help needed walking in hospital room?: A Little Help needed climbing 3-5 steps with a railing? : A Little 6 Click Score: 22    End of  Session   Activity Tolerance: Patient tolerated treatment well Patient left: in chair;with call bell/phone within reach   PT Visit Diagnosis: Difficulty in walking, not elsewhere classified (R26.2)     Time: 6219-4712 PT Time Calculation (min) (ACUTE ONLY): 27 min  Charges:  $Gait Training: 23-37 mins                    G Codes:          Weston Anna, MPT Pager: [phone]

## 2017-11-02 ENCOUNTER — Telehealth: Payer: Self-pay | Admitting: Oncology

## 2017-11-02 LAB — BASIC METABOLIC PANEL
Anion gap: 4 — ABNORMAL LOW (ref 5–15)
BUN: 17 mg/dL (ref 6–20)
CHLORIDE: 108 mmol/L (ref 101–111)
CO2: 22 mmol/L (ref 22–32)
CREATININE: 0.93 mg/dL (ref 0.44–1.00)
Calcium: 8.4 mg/dL — ABNORMAL LOW (ref 8.9–10.3)
GFR calc Af Amer: >60 mL/min (ref >60)
GFR calc non Af Amer: 55 mL/min — ABNORMAL LOW (ref >60)
Glucose, Bld: 76 mg/dL (ref 65–99)
POTASSIUM: 4.5 mmol/L (ref 3.5–5.1)
Sodium: 134 mmol/L — ABNORMAL LOW (ref 135–145)

## 2017-11-02 LAB — CBC
HEMATOCRIT: 28 % — AB (ref 36.0–46.0)
Hemoglobin: 9.2 g/dL — ABNORMAL LOW (ref 12.0–15.0)
MCH: 28.5 pg (ref 26.0–34.0)
MCHC: 32.9 g/dL (ref 30.0–36.0)
MCV: 86.7 fL (ref 78.0–100.0)
PLATELETS: 286 10*3/uL (ref 150–400)
RBC: 3.23 MIL/uL — ABNORMAL LOW (ref 3.87–5.11)
RDW: 17.2 % — AB (ref 11.5–15.5)
WBC: 10.5 10*3/uL (ref 4.0–10.5)

## 2017-11-02 MED ORDER — OPIUM 10 MG/ML (1%) PO TINC
6.0000 mg | Freq: Four times a day (QID) | ORAL | Status: DC
  Administered 2017-11-02 – 2017-11-03 (×6): 6 mg via ORAL
  Filled 2017-11-02 (×6): qty 1

## 2017-11-02 NOTE — Progress Notes (Signed)
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    Assessment & Plan:   Active Problems:   AKI (acute kidney injury) ([place])   Dehydration   Hyponatremia   Cough   Pressure injury of skin   Malnutrition of moderate degree   AKI probably sec to dehydration vs ATN.  Hydrate and repeat creatinine in am.  Creatinine is back to baseline.    hypovolemic hyponatremia:  Improved with hydration.  Possibly from GI losses from ostomy.  Sodium stable between 132 to 134.    High output ileostomy: Prn imodium .  Surgery added tincture of opium and increase the dose to 4 times daily.  Her ostomy output continues to be more than 1 L.  We will plan for PICC line today and try to discharge her home tomorrow with IV infusion therapies at home.   Hypo magnesemia: Replaced.  Repeat in a.m.  Hypophosphatemia IV phosphate will be supplemented.  Repeat phos still low. Continue to replete and recheck in am.    Severe protein calorie malnutrition: Nutrition consult.    Small bowel carcinoma: S/p ileostomy.  No new complaints.    H/o TIA:  On plavix     DVT prophylaxis: lovenox.  Code Status: DNR Family Communication: (none at bedside) Disposition Plan: home tomorrow   Consultants:   SURGERY.    Procedures: none.   Antimicrobials: none.   Subjective: STEMI output is about 1250 today.  She denies any chest pain shortness of breath nausea or vomiting.  Her ostomy output is watery. Objective: Vitals:   11/02/17 0457 11/02/17 0603 11/02/17 1008 11/02/17 1340  BP:  (!) 101/59 113/81 98/61    Pulse:  68 69 74  Resp:  18 18 18   Temp:  97.8 F (36.6 C) 97.8 F (36.6 C) 98.4 F (36.9 C)  TempSrc:  Oral Oral Oral  SpO2:  97% 100% 99%  Weight: 71 kg (156 lb 8.4 oz)     Height:        Intake/Output Summary (Last 24 hours) at 11/02/2017 1628 Last data filed at 11/02/2017 1400 Gross per 24 hour  Intake 1920 ml  Output 1250 ml  Net 670 ml   Filed Weights   10/31/17 0627 11/01/17 0500 11/02/17 0457  Weight: 67.1 kg (147 lb 14.9 oz) 67.1 kg (147 lb 14.9 oz) 71 kg (156 lb 8.4 oz)    Examination: Unchanged from yesterday  General exam: in good spirits. No new complaints.  Respiratory system:good air entry , no wheezing or rhonchi.  Cardiovascular system: S1 & S2 heard, RRR. No JVD,  No pedal edema. Gastrointestinal system: abd is soft non tender non distended bowel sounds good.  Ostomy output is watery. Central nervous system: Alert and oriented.  Nonfocal Extremities: Symmetric 5 x 5 power 1 + pedal edema.  Skin: No rashes, lesions or ulcers Psychiatry: . Mood & affect appropriate.     Data Reviewed: I have personally reviewed following labs and imaging studies  CBC: Recent Labs  Lab 10/27/17 0708 10/28/17 0404 11/02/17 0459  WBC 11.9* 13.1* 10.5  NEUTROABS  --  7.5  --   HGB 9.6* 9.8* 9.2*  HCT 29.5* 29.7* 28.0*  MCV 85.3 85.8  86.7  PLT 240 263 347   Basic Metabolic Panel: Recent Labs  Lab 10/28/17 0404 10/29/17 0354 10/30/17 0357 10/31/17 0447 11/01/17 0432 11/02/17 0459  NA 133* 134* 134* 134* 132* 134*  K 4.6 4.5 4.6 4.5 4.5 4.5  CL 109 110 113* 110 108 108  CO2 17* 19* 18* 19* 20* 22  GLUCOSE 82 82 76 73 85 76  BUN 23* 22* 14 15 16 17   CREATININE 0.97 1.02* 0.83 0.86 0.95 0.93  CALCIUM 8.0* 8.0* 8.1* 8.1* 8.2* 8.4*  MG 1.0* 1.7 1.2* 1.6* 1.6*  --   PHOS  --   --   --  1.6* 2.1*  --    GFR: Estimated Creatinine Clearance: 41.6 mL/min (by C-G formula based on SCr of 0.93 mg/dL). Liver Function Tests: Recent Labs  Lab 10/28/17 0404   AST 16  ALT 16  ALKPHOS 103  BILITOT 0.8  PROT 5.1*  ALBUMIN 2.0*   No results for input(s): LIPASE, AMYLASE in the last 168 hours. No results for input(s): AMMONIA in the last 168 hours. Coagulation Profile: No results for input(s): INR, PROTIME in the last 168 hours. Cardiac Enzymes: No results for input(s): CKTOTAL, CKMB, CKMBINDEX, TROPONINI in the last 168 hours. BNP (last 3 results) No results for input(s): PROBNP in the last 8760 hours. HbA1C: No results for input(s): HGBA1C in the last 72 hours. CBG: No results for input(s): GLUCAP in the last 168 hours. Lipid Profile: No results for input(s): CHOL, HDL, LDLCALC, TRIG, CHOLHDL, LDLDIRECT in the last 72 hours. Thyroid Function Tests: No results for input(s): TSH, T4TOTAL, FREET4, T3FREE, THYROIDAB in the last 72 hours. Anemia Panel: No results for input(s): VITAMINB12, FOLATE, FERRITIN, TIBC, IRON, RETICCTPCT in the last 72 hours. Sepsis Labs: No results for input(s): PROCALCITON, LATICACIDVEN in the last 168 hours.  Recent Results (from the past 240 hour(s))  TECHNOLOGIST REVIEW     Status: None   Collection Time: 10/24/17 11:55 AM  Result Value Ref Range Status   Technologist Review Rare metamyelocte   Final  MRSA PCR Screening     Status: None   Collection Time: 10/24/17  2:55 PM  Result Value Ref Range Status   MRSA by PCR NEGATIVE NEGATIVE Final    Comment:        The GeneXpert MRSA Assay (FDA approved for NASAL specimens only), is one component of a comprehensive MRSA colonization surveillance program. It is not intended to diagnose MRSA infection nor to guide or monitor treatment for MRSA infections.          Radiology Studies: No results found.      Scheduled Meds: . clopidogrel  75 mg Oral Daily  . diphenoxylate-atropine  2 tablet Oral QID  . escitalopram  5 mg Oral Daily  . folic acid  1 mg Oral Daily  . heparin  5,000 Units Subcutaneous Q8H  . levothyroxine  100 mcg Oral QAC  breakfast  . loperamide  2 mg Oral TID  . mesalamine  1,500 mg Oral BID  . multivitamin with minerals  1 tablet Oral Daily  . Opium  6 mg Oral QID  . oxybutynin  5 mg Oral Daily  . pantoprazole  40 mg Oral BID AC  . predniSONE  10 mg Oral Q breakfast  . protein supplement shake  11 oz Oral Q24H  . psyllium  1 packet Oral BID   Continuous Infusions: . sodium chloride 50 mL/hr at 11/01/17 0910     LOS: 9 days  Time spent: 35 minutes.     Hosie Poisson, MD [facility] Pager [phone]  If 7PM-7AM, please contact night-coverage [email or web address] Password [email or web address] 11/02/2017, 4:28 PM

## 2017-11-02 NOTE — Progress Notes (Signed)
Spoke with patient about option of going home with PICC line and home IV therapy in light of high ileostomy output and she is agreeable with this plan. PICC insertion planned for today. Spoke with CM who will assist in setting up [facility] needs. Spoke with Dr. Karleen Hampshire who is in agreement with this plan and hopefull for discharge tomorrow.  Discussed with patient that she should continue to monitor intake and output. Patient agrees with this. Patient aware to call with any questions or concerns.  Brigid Re , [facility] 11/02/2017, 2:19 PM Pager: [phone] Consults: [phone] Mon-Fri 7:00 am-4:30 pm Sat-Sun 7:00 am-11:30 am

## 2017-11-02 NOTE — Telephone Encounter (Signed)
Scheduled appt per 12/20 sch message - patient is aware of appt date and time.

## 2017-11-02 NOTE — Progress Notes (Signed)
[facility] Progress Note     Subjective: CC: high output from ileostomy Patient resting in bed after breakfast this AM. She is adamant that she was emptying/recording her ileostomy output yesterday and feels like it was mis-recorded. Went through recorded output with patient and she feels like an extra 250 cc may have been recorded. That being said, she still had over 1L output even if that 250 cc was entered erroneously. No leakage from ileostomy bag all day yesterday until this AM. Patient feels like she was urinating more but attributes this to having some tea yesterday.  UOP good. VSS.   Objective: Vital signs in last 24 hours: Temp:  [97.8 F (36.6 C)-98.2 F (36.8 C)] 97.8 F (36.6 C) (12/21 0603) Pulse Rate:  [68-70] 68 (12/21 0603) Resp:  [18] 18 (12/21 0603) BP: (101-131)/(55-59) 101/59 (12/21 0603) SpO2:  [95 %-100 %] 97 % (12/21 0603) Weight:  [71 kg (156 lb 8.4 oz)] 71 kg (156 lb 8.4 oz) (12/21 0457) Last BM Date: 11/01/17  Intake/Output from previous day: 12/20 0701 - 12/21 0700 In: 1999.2 [P.O.:720; I.V.:1279.2] Out: 1525 [Stool:1525] Intake/Output this shift: No intake/output data recorded.  PE: Gen: Alert, NAD, pleasant Card: Regular rate and rhythm, pedal pulses 2+ BL Pulm: Normal effort, clear to auscultation bilaterally Abd: Soft, non-tender, non-distended, bowel sounds present, midline incision well healed, stoma pink and ostomy with moderate green liquid stool. Skin: warm and dry, no rashes  Psych: A&Ox3    Lab Results:  Recent Labs    11/02/17 0459  WBC 10.5  HGB 9.2*  HCT 28.0*  PLT 286   BMET Recent Labs    11/01/17 0432 11/02/17 0459  NA 132* 134*  K 4.5 4.5  CL 108 108  CO2 20* 22  GLUCOSE 85 76  BUN 16 17  CREATININE 0.95 0.93  CALCIUM 8.2* 8.4*   PT/INR No results for input(s): LABPROT, INR in the last 72 hours. CMP     Component Value Date/Time   NA 134 (L) 11/02/2017 0459   NA 120 (L) 10/24/2017 1155    K 4.5 11/02/2017 0459   K 5.8 (H) 10/24/2017 1155   CL 108 11/02/2017 0459   CO2 22 11/02/2017 0459   CO2 16 (L) 10/24/2017 1155   GLUCOSE 76 11/02/2017 0459   GLUCOSE 197 (H) 10/24/2017 1155   BUN 17 11/02/2017 0459   BUN 83.4 (H) 10/24/2017 1155   CREATININE 0.93 11/02/2017 0459   CREATININE 3.6 (HH) 10/24/2017 1155   CALCIUM 8.4 (L) 11/02/2017 0459   CALCIUM 10.1 10/24/2017 1155   PROT 5.1 (L) 10/28/2017 0404   PROT 8.3 10/24/2017 1155   ALBUMIN 2.0 (L) 10/28/2017 0404   ALBUMIN 3.0 (L) 10/24/2017 1155   AST 16 10/28/2017 0404   AST 31 10/24/2017 1155   ALT 16 10/28/2017 0404   ALT 36 10/24/2017 1155   ALKPHOS 103 10/28/2017 0404   ALKPHOS 217 (H) 10/24/2017 1155   BILITOT 0.8 10/28/2017 0404   BILITOT 0.61 10/24/2017 1155   GFRNONAA 55 (L) 11/02/2017 0459   GFRAA >60 11/02/2017 0459   Lipase     Component Value Date/Time   LIPASE 17 09/16/2017 1507    Anti-infectives: Anti-infectives (From admission, onward)   None       Assessment/Plan Severe dehydration and acute kidney injury -secondary to high ileostomy output, improving - patient with 1.25-1.5 L output in 24 h  Metastatic adenocarcinoma with peritoneal carcinomatosis,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  Donnie Mesa -History of recurrent small bowel obstruction/Crohn's disease/hospitalized 11/4-11/18/18.  Atrial fibrillation with rapid ventricular rate History of hypertension History of hypothyroidism History of lung cancer and prior lobectomy History of tobacco use Mild AR, trivial PR, mild TR. GERD/history of duodenal ulcers Legally blind/macular degeneration Malnutrition History of TIAs.  FEN:IV fluids/regular diet ZJ:QDUK DVT:SCD Foley:none Follow-up:Dr. Tsuei  Plan:Ileostomy output >1L  in 24 h. Increase tincture of opium to QID, likely home tomorrow    LOS: 9 days    Brigid Re , [facility] Surgery 11/02/2017, 8:09 AM Pager: [phone] Consults: [phone] Mon-Fri 7:00 am-4:30 pm Sat-Sun 7:00 am-11:30 am

## 2017-11-02 NOTE — Progress Notes (Signed)
[facility]  Active pt with [facility] who will DC home for IV Fluids with [facility] via PICC. [facility] will plan for resuming [place] services on Sunday.  If patient discharges after hours, please call [phone].   Larry Sierras 11/02/2017, 3:14 PM

## 2017-11-02 NOTE — Progress Notes (Signed)
Date: November 02, 2017 Terri [name], BSN, [place], [place] Carolynn Sayers made aware of the need for iv flds in the home, ot getting picc line today and will be dcd to home on [phone]. Response rec'd and plan is in place.

## 2017-11-02 NOTE — Progress Notes (Signed)
OT note:  Noted plan is for pt to return home tomorrow.  Pt had orders for [facility], HHSW, and HHOT prior to this hospitalization.  RN will ask for resume orders prior to d/c, which is planned tomorrow.  [place], [place] [phone] 11/02/2017

## 2017-11-02 NOTE — Discharge Instructions (Signed)
PICC Home Guide °A peripherally inserted central catheter (PICC) is a long, thin, flexible tube that is inserted into a vein in the upper arm. It is a form of intravenous (IV) access. It is considered to be a "central" line because the tip of the PICC ends in a large vein in your chest. This large vein is called the superior vena cava (SVC). The PICC tip ends in the SVC because there is a lot of blood flow in the SVC. This allows medicines and IV fluids to be quickly distributed throughout the body. The PICC is inserted using a sterile technique by a specially trained nurse or physician. After the PICC is inserted, a chest X-ray exam is done to be sure it is in the correct place. °A PICC may be placed for different reasons, such as: °· To give medicines and liquid nutrition that can only be given through a central line. Examples are: °? Certain antibiotic treatments. °? Chemotherapy. °? Total parenteral nutrition (TPN). °· To take frequent blood samples. °· To give IV fluids and blood products. °· If there is difficulty placing a peripheral intravenous (PIV) catheter. ° °If taken care of properly, a PICC can remain in place for several months. A PICC can also allow a person to go home from the hospital early. Medicine and PICC care can be managed at home by a family member or home health care team. °What problems can happen when I have a PICC? °Problems with a PICC can occasionally occur. These may include the following: °· A blood clot (thrombus) forming in or at the tip of the PICC. This can cause the PICC to become clogged. A clot-dissolving medicine called tissue plasminogen activator (tPA) can be given through the PICC to help break up the clot. °· Inflammation of the vein (phlebitis) in which the PICC is placed. Signs of inflammation may include redness, pain at the insertion site, red streaks, or being able to feel a "cord" in the vein where the PICC is located. °· Infection in the PICC or at the insertion  site. Signs of infection may include fever, chills, redness, swelling, or pus drainage from the PICC insertion site. °· PICC movement (malposition). The PICC tip may move from its original position due to excessive physical activity, forceful coughing, sneezing, or vomiting. °· A break or cut in the PICC. It is important to not use scissors near the PICC. °· Nerve or tendon irritation or injury during PICC insertion. ° °What should I keep in mind about activities when I have a PICC? °· You may bend your arm and move it freely. If your PICC is near or at the bend of your elbow, avoid activity with repeated motion at the elbow. °· Rest at home for the remainder of the day following PICC line insertion. °· Avoid lifting heavy objects as instructed by your health care provider. °· Avoid using a crutch with the arm on the same side as your PICC. You may need to use a walker. °What should I know about my PICC dressing? °· Keep your PICC bandage (dressing) clean and dry to prevent infection. °? Ask your health care provider when you may shower. Ask your health care provider to teach you how to wrap the PICC when you do take a shower. °· Change the PICC dressing as instructed by your health care provider. °· Change your PICC dressing if it becomes loose or wet. °What should I know about PICC care? °· Check the PICC insertion   site daily for leakage, redness, swelling, or pain. °· Do not take a bath, swim, or use hot tubs when you have a PICC. Cover PICC line with clear plastic wrap and tape to keep it dry while showering. °· Flush the PICC as directed by your health care provider. Let your health care provider know right away if the PICC is difficult to flush or does not flush. Do not use force to flush the PICC. °· Do not use a syringe that is less than 10 mL to flush the PICC. °· Never pull or tug on the PICC. °· Avoid blood pressure checks on the arm with the PICC. °· Keep your PICC identification card with you at all  times. °· Do not take the PICC out yourself. Only a trained clinical professional should remove the PICC. °Get help right away if: °· Your PICC is accidentally pulled all the way out. If this happens, cover the insertion site with a bandage or gauze dressing. Do not throw the PICC away. Your health care provider will need to inspect it. °· Your PICC was tugged or pulled and has partially come out. Do not  push the PICC back in. °· There is any type of drainage, redness, or swelling where the PICC enters the skin. °· You cannot flush the PICC, it is difficult to flush, or the PICC leaks around the insertion site when it is flushed. °· You hear a "flushing" sound when the PICC is flushed. °· You have pain, discomfort, or numbness in your arm, shoulder, or jaw on the same side as the PICC. °· You feel your heart "racing" or skipping beats. °· You notice a hole or tear in the PICC. °· You develop chills or a fever. °This information is not intended to replace advice given to you by your health care provider. Make sure you discuss any questions you have with your health care provider. °Document Released: 05/06/2003 Document Revised: 05/19/2016 Document Reviewed: 08/22/2013 °Elsevier Interactive Patient Education © 2017 Elsevier Inc. ° °

## 2017-11-03 ENCOUNTER — Inpatient Hospital Stay (HOSPITAL_COMMUNITY): Payer: Medicare Other

## 2017-11-03 LAB — BASIC METABOLIC PANEL
ANION GAP: 5 (ref 5–15)
BUN: 15 mg/dL (ref 6–20)
CALCIUM: 8.4 mg/dL — AB (ref 8.9–10.3)
CO2: 20 mmol/L — AB (ref 22–32)
Chloride: 108 mmol/L (ref 101–111)
Creatinine, Ser: 0.97 mg/dL (ref 0.44–1.00)
GFR calc Af Amer: >60 mL/min (ref >60)
GFR calc non Af Amer: 52 mL/min — ABNORMAL LOW (ref >60)
GLUCOSE: 80 mg/dL (ref 65–99)
Potassium: 4.8 mmol/L (ref 3.5–5.1)
Sodium: 133 mmol/L — ABNORMAL LOW (ref 135–145)

## 2017-11-03 LAB — MAGNESIUM: Magnesium: 1.4 mg/dL — ABNORMAL LOW (ref 1.7–2.4)

## 2017-11-03 LAB — PHOSPHORUS: Phosphorus: 2.8 mg/dL (ref 2.5–4.6)

## 2017-11-03 MED ORDER — DIPHENOXYLATE-ATROPINE 2.5-0.025 MG PO TABS: 2.0000 | ORAL_TABLET | Freq: Four times a day (QID) | ORAL | 0 refills | Status: DC

## 2017-11-03 MED ORDER — MAGNESIUM SULFATE 4 GM/100ML IV SOLN
4.0000 g | Freq: Once | INTRAVENOUS | Status: AC
  Administered 2017-11-03: 4 g via INTRAVENOUS
  Filled 2017-11-03: qty 100

## 2017-11-03 MED ORDER — SODIUM CHLORIDE 0.9% FLUSH: 10.0000 mL | Freq: Two times a day (BID) | INTRAVENOUS | Status: DC

## 2017-11-03 MED ORDER — ONDANSETRON HCL 4 MG PO TABS: 4.0000 mg | ORAL_TABLET | Freq: Four times a day (QID) | ORAL | 0 refills | Status: DC | PRN

## 2017-11-03 MED ORDER — PREMIER PROTEIN SHAKE: 11.0000 [oz_av] | ORAL | 0 refills | Status: AC

## 2017-11-03 MED ORDER — HEPARIN SOD (PORK) LOCK FLUSH 100 UNIT/ML IV SOLN
250.0000 [IU] | INTRAVENOUS | Status: AC | PRN
  Administered 2017-11-03: 250 [IU]

## 2017-11-03 MED ORDER — SODIUM CHLORIDE 0.9 % IV SOLN: 1000.0000 mL | INTRAVENOUS | 0 refills | Status: DC

## 2017-11-03 MED ORDER — SODIUM CHLORIDE 0.9% FLUSH
10.0000 mL | INTRAVENOUS | Status: DC | PRN
  Administered 2017-11-03: 10 mL
  Filled 2017-11-03: qty 40

## 2017-11-03 MED ORDER — SODIUM CHLORIDE 0.9 % IV SOLN: INTRAVENOUS | 0 refills | Status: AC

## 2017-11-03 MED ORDER — OPIUM 10 MG/ML (1%) PO TINC: 6.0000 mg | Freq: Four times a day (QID) | ORAL | 0 refills | Status: DC

## 2017-11-03 MED ORDER — PSYLLIUM 95 % PO PACK: 1.0000 | PACK | Freq: Two times a day (BID) | ORAL | Status: DC

## 2017-11-03 NOTE — Progress Notes (Signed)
NCM contacted [facility] RN, OT and IV hydration. [facility] RN scheduled to come out to do start of care on 11/04/2017. Jonnie Finner RN CCM Case Mgmt phone [phone]

## 2017-11-03 NOTE — Progress Notes (Signed)
[facility] [place] [facility] Progress Note     Subjective: CC: high output from ileostomy No acute changes. 1225 recorded from ostomy.    Objective: Vital signs in last 24 hours: Temp:  [97.8 F (36.6 C)-99.1 F (37.3 C)] 98.2 F (36.8 C) (12/22 0506) Pulse Rate:  [65-74] 72 (12/22 0506) Resp:  [17-20] 20 (12/22 0506) BP: (98-116)/(51-81) 116/57 (12/22 0506) SpO2:  [93 %-100 %] 93 % (12/22 0506) Weight:  [70.7 kg (155 lb 13.8 oz)] 70.7 kg (155 lb 13.8 oz) (12/22 0506) Last BM Date: 11/01/17  Intake/Output from previous day: 12/21 0701 - 12/22 0700 In: 1780 [P.O.:780; I.V.:1000] Out: 1225 [Stool:1225] Intake/Output this shift: No intake/output data recorded.  PE: Gen: Alert, NAD, pleasant Card: Regular rate and rhythm, pedal pulses 2+ BL Pulm: Normal effort, clear to auscultation bilaterally Abd: Soft, non-tender, non-distended, bowel sounds present, midline incision well healed, stoma pink and ostomy with moderate green liquid stool. Skin: warm and dry, no rashes  Psych: A&Ox3    Lab Results:  Recent Labs    11/02/17 0459  WBC 10.5  HGB 9.2*  HCT 28.0*  PLT 286   BMET Recent Labs    11/02/17 0459 11/03/17 0426  NA 134* 133*  K 4.5 4.8  CL 108 108  CO2 22 20*  GLUCOSE 76 80  BUN 17 15  CREATININE 0.93 0.97  CALCIUM 8.4* 8.4*   PT/INR No results for input(s): LABPROT, INR in the last 72 hours. CMP     Component Value Date/Time   NA 133 (L) 11/03/2017 0426   NA 120 (L) 10/24/2017 1155   K 4.8 11/03/2017 0426   K 5.8 (H) 10/24/2017 1155   CL 108 11/03/2017 0426   CO2 20 (L) 11/03/2017 0426   CO2 16 (L) 10/24/2017 1155   GLUCOSE 80 11/03/2017 0426   GLUCOSE 197 (H) 10/24/2017 1155   BUN 15 11/03/2017 0426   BUN 83.4 (H) 10/24/2017 1155   CREATININE 0.97 11/03/2017 0426   CREATININE 3.6 (HH) 10/24/2017 1155   CALCIUM 8.4 (L) 11/03/2017 0426   CALCIUM 10.1 10/24/2017 1155   PROT 5.1 (L) 10/28/2017 0404   PROT 8.3 10/24/2017 1155   ALBUMIN  2.0 (L) 10/28/2017 0404   ALBUMIN 3.0 (L) 10/24/2017 1155   AST 16 10/28/2017 0404   AST 31 10/24/2017 1155   ALT 16 10/28/2017 0404   ALT 36 10/24/2017 1155   ALKPHOS 103 10/28/2017 0404   ALKPHOS 217 (H) 10/24/2017 1155   BILITOT 0.8 10/28/2017 0404   BILITOT 0.61 10/24/2017 1155   GFRNONAA 52 (L) 11/03/2017 0426   GFRAA >60 11/03/2017 0426   Lipase     Component Value Date/Time   LIPASE 17 09/16/2017 1507    Anti-infectives: Anti-infectives (From admission, onward)   None       Assessment/Plan Severe dehydration and acute kidney injury -secondary to high ileostomy output, improving - patient with 1.25-1.5 L output in 24 h despite multiple agents  Metastatic adenocarcinoma with peritoneal carcinomatosis,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 Donnie Mesa -History of recurrent small bowel obstruction/Crohn's disease/hospitalized 11/4-11/18/18.  Atrial fibrillation with rapid ventricular rate History of hypertension History of hypothyroidism History of lung cancer and prior lobectomy History of tobacco use Mild AR, trivial PR, mild TR. GERD/history of duodenal ulcers Legally blind/macular degeneration Malnutrition History of TIAs.  FEN:IV fluids/regular diet ZH:GDJM DVT:SCD Foley:none Follow-up:Dr. Tsuei  Plan:Ileostomy output >1L  in 24 h. Plan is for PICC and intermittent IV fluids at home.  Will follow intermittently.     LOS: 10 days    Terri [name] , [facility] 11/03/2017, 7:54 AM

## 2017-11-03 NOTE — Progress Notes (Signed)
Nurse reviewed discharge instructions with pt. Pt verbalized understanding of discharge instructions, follow up appointments and new medications. Prescriptions given to pt prior to discharge.  Pt discharged with PICC line.

## 2017-11-03 NOTE — Progress Notes (Signed)
Peripherally Inserted Central Catheter/Midline Placement  The IV Nurse has discussed with the patient and/or persons authorized to consent for the patient, the purpose of this procedure and the potential benefits and risks involved with this procedure.  The benefits include less needle sticks, lab draws from the catheter, and the patient may be discharged home with the catheter. Risks include, but not limited to, infection, bleeding, blood clot (thrombus formation), and puncture of an artery; nerve damage and irregular heartbeat and possibility to perform a PICC exchange if needed/ordered by physician.  Alternatives to this procedure were also discussed.  Bard Power PICC patient education guide, fact sheet on infection prevention and patient information card has been provided to patient /or left at bedside.    PICC/Midline Placement Documentation  PICC Single Lumen 11/03/17 PICC Right Brachial 37 cm 0 cm (Active)  Indication for Insertion or Continuance of Line Home intravenous therapies (PICC only) 11/03/2017  5:22 PM  Exposed Catheter (cm) 0 cm 11/03/2017  5:22 PM  Site Assessment Clean;Dry;Intact 11/03/2017  5:22 PM  Line Status Saline locked;Flushed;Blood return noted 11/03/2017  5:22 PM  Dressing Type Transparent 11/03/2017  5:22 PM  Dressing Status Clean;Dry;Intact;Antimicrobial disc in place 11/03/2017  5:22 PM  Line Care Connections checked and tightened 11/03/2017  5:22 PM  Line Adjustment (NICU/IV Team Only) No 11/03/2017  5:22 PM  Dressing Intervention New dressing 11/03/2017  5:22 PM  Dressing Change Due 11/10/17 11/03/2017  5:22 PM       Rolena Infante 11/03/2017, 5:23 PM

## 2017-11-05 ENCOUNTER — Telehealth: Payer: Self-pay | Admitting: *Deleted

## 2017-11-05 MED FILL — OPIUM TINCTURE 10 MG/ML: 10 MG/ML | 4 days supply | Qty: 10 | Fill #0

## 2017-11-05 NOTE — Telephone Encounter (Signed)
Fax from [facility] called to report they were not able to pick up opium tincture because pharmacy doesn't carry it. Pt continues to have high output from ileostomy. Left message for Andee Poles to call office with update on pt status, left info that [facility] usually has this medication.

## 2017-11-07 ENCOUNTER — Other Ambulatory Visit: Payer: Self-pay | Admitting: *Deleted

## 2017-11-07 MED ORDER — OPIUM 10 MG/ML (1%) PO TINC
6.0000 mg | Freq: Four times a day (QID) | ORAL | 0 refills | Status: DC
Start: 2017-11-07 — End: 2017-11-20

## 2017-11-07 MED ORDER — DIPHENOXYLATE-ATROPINE 2.5-0.025 MG PO TABS
2.0000 | ORAL_TABLET | Freq: Four times a day (QID) | ORAL | 0 refills | Status: DC
Start: 2017-11-07 — End: 2017-11-08

## 2017-11-07 MED FILL — DIPHENOXYLATE/ATROPINE TAB: 2.5-0.025 | 4 days supply | Qty: 30 | Fill #0

## 2017-11-08 ENCOUNTER — Telehealth: Payer: Self-pay

## 2017-11-08 ENCOUNTER — Other Ambulatory Visit: Payer: Self-pay

## 2017-11-08 ENCOUNTER — Encounter (HOSPITAL_COMMUNITY): Payer: Self-pay

## 2017-11-08 DIAGNOSIS — C762 Malignant neoplasm of abdomen: Secondary | ICD-10-CM

## 2017-11-08 MED ORDER — DIPHENOXYLATE-ATROPINE 2.5-0.025 MG PO TABS: 2.0000 | ORAL_TABLET | Freq: Four times a day (QID) | ORAL | 0 refills | Status: DC

## 2017-11-08 MED FILL — OPIUM TINCTURE 10 MG/ML: 10 MG/ML | 4 days supply | Qty: 10 | Fill #0

## 2017-11-08 NOTE — Telephone Encounter (Signed)
Called Amy, RN to receive exact order needed for IVF. Prescription written, signed and faxed to [facility]. Amy, RN notifying patient and family.

## 2017-11-08 NOTE — Telephone Encounter (Signed)
Reached out to [facility] triage to follow up about IVF.

## 2017-11-08 NOTE — Progress Notes (Signed)
Called in Lomotil refill.

## 2017-11-08 NOTE — Telephone Encounter (Signed)
[facility] and ?daughter called requesting home IVF to continue. Concerned about risk for dehydration. Will talk to Lattie Haw, NP.   Returned call to home health RN, Amy and LVM with questions regarding output and fluid order.   Spoke with Richarda Blade from [facility]. Informed her that they should contact physician who initially ordered home IVF for continuation of IVF. Voiced understanding.   Spoke with daughter to inform her that [facility] will be following up regarding fluid orders with the ordering provider.

## 2017-11-09 ENCOUNTER — Telehealth: Payer: Self-pay

## 2017-11-09 MED FILL — DIPHENOXYLATE/ATROPINE TAB: 2.5-0.025 | 4 days supply | Qty: 30 | Fill #0

## 2017-11-09 NOTE — Telephone Encounter (Signed)
Spoke with patient daughter to inform her that Lomotil is ready for pick up at [place]. Voiced understanding.

## 2017-11-09 NOTE — Discharge Summary (Addendum)
Physician Discharge Summary  Terri [name] JJH:[id number] DOB: [birth year] DOA: 10/24/2017  PCP: Prince Solian, MD  Admit date: 10/24/2017 Discharge date: 11/03/2017  Admitted From: Home.  Disposition: Home.   Recommendations for Outpatient Follow-up:  1. Follow up with PCP in 1-2 weeks 2. Please obtain BMP/CBC in one week   Home Health: yes   Discharge Condition: GUARDED.  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    Discharge Diagnoses:  Active Problems:   AKI (acute kidney injury) ([place])   Dehydration   Hyponatremia   Cough   Malnutrition of moderate degree  AKI probably sec to dehydration vs ATN.  Hydrate and repeat creatinine in am.  Creatinine is back to baseline.    hypovolemic hyponatremia:  Improved with hydration.  Possibly from GI losses from ostomy.  Sodium stable between 132 to 134.    High output ileostomy: Prn imodium .  Surgery added tincture of opium and increase the dose to 4 times daily.  Her ostomy output continues to be more than 1 L. PICC line today discharge home with IV therapies and surgery to follow the electrolytes and IV therapies,.  Hypo magnesemia: Replaced.    Hypophosphatemia IV phosphate given, recommend outpatient follow upw ith phos level in 2 to 3 days.   Severe protein calorie malnutrition: Nutrition consult.    Small bowel carcinoma: S/p ileostomy.  No new complaints.    H/o TIA:  On plavix     Discharge Instructions  Discharge Instructions    Diet - low sodium heart healthy   Complete by:  As directed    Increase activity slowly   Complete by:  As  directed      Allergies as of 11/03/2017      Reactions   Lidocaine    Claims that it made her heart beat go irregular      Medication List    STOP taking these medications   losartan 50 MG tablet Commonly known as:  COZAAR     TAKE these medications   carboxymethylcellulose 0.5 % Soln Commonly known as:  REFRESH PLUS Place 1 drop into both eyes daily as needed (dry eyes).   clidinium-chlordiazePOXIDE 5-2.5 MG capsule Commonly known as:  LIBRAX Take 1 capsule daily by mouth.   clopidogrel 75 MG tablet Commonly known as:  PLAVIX Take 75 mg by mouth daily.   escitalopram 5 MG tablet Commonly known as:  LEXAPRO TK 1 T PO D   folic acid 1 MG tablet Commonly known as:  FOLVITE Take 1 mg by mouth daily.   levothyroxine 100 MCG tablet Commonly known as:  SYNTHROID, LEVOTHROID Take 100 mcg by mouth daily before breakfast.   loperamide 2 MG capsule Commonly known as:  IMODIUM Take 2 mg by mouth 4 (four) times daily - after meals and at bedtime.   mesalamine 500 MG CR capsule Commonly known as:  PENTASA Take 1,500 mg by mouth 2 (two) times daily.   MULTI-DELYN/IRON Liqd Take 10 mLs by mouth daily. OTC iron   multivitamin with minerals Tabs tablet Take 1 tablet by mouth daily.   ondansetron 4 MG tablet Commonly known as:  ZOFRAN Take 1 tablet (4 mg total) by mouth every 6 (six) hours as needed for nausea.   oxybutynin  5 MG tablet Commonly known as:  DITROPAN Take 5 mg by mouth daily.   oxyCODONE-acetaminophen 5-325 MG tablet Commonly known as:  PERCOCET/ROXICET Take 1 tablet every 4 (four) hours as needed by mouth for moderate pain.   pantoprazole 40 MG tablet Commonly known as:  PROTONIX Take 40 mg by mouth every morning.   predniSONE 10 MG tablet Commonly known as:  DELTASONE Take 10 mg daily with breakfast by mouth. What changed:  Another medication with the same name was removed. Continue taking this medication, and follow the directions you see  here.   protein supplement shake Liqd Commonly known as:  PREMIER PROTEIN Take 325 mLs (11 oz total) by mouth daily.   psyllium 95 % Pack Commonly known as:  HYDROCIL/METAMUCIL Take 1 packet by mouth 2 (two) times daily.   sodium chloride 0.65 % Soln nasal spray Commonly known as:  OCEAN Place 2 sprays as needed into both nostrils for congestion.   sodium chloride 0.9 % infusion Normal saline at 82ml/hr.   VENTOLIN HFA 108 (90 Base) MCG/ACT inhaler Generic drug:  albuterol Inhale 2 puffs into the lungs 4 (four) times daily as needed for wheezing or shortness of breath.      Follow-up Information    Donnie Mesa, MD. Call.   Specialty:  General Surgery Why:  Call and follow up as needed. You may also call if you have questions or concerns regarding your ostomy.  Contact information: [address] [place] [address] [phone]        Prince Solian, MD Follow up.   Specialty:  Internal Medicine Why:  Follow up with your PCP within 2-3 weeks of leaving the hospital.  Contact information: [address] [place] [address] [phone]        Ladell Pier, MD Follow up.   Specialty:  Oncology Why:  Follow with Dr. Benay Spice as planned the week of 12/31.  Contact information: [place] [address] [phone]        [facility] Follow up.   Specialty:  Home Health Services Why:  Home Health RN, Occupational Therapy-agency will call to arrange initial visit Contact information: [place] [address] [phone]          Allergies  Allergen Reactions  . Lidocaine     Claims that it made her heart beat go irregular    Consultations:     Procedures/Studies: US Renal  Result Date: 10/25/2017 CLINICAL DATA:  Acute kidney injury EXAM: RENAL / URINARY TRACT ULTRASOUND COMPLETE COMPARISON:  CT 09/16/2017 FINDINGS: Right Kidney: Length: 8.7 cm. Echogenicity  within normal limits. No mass or hydronephrosis visualized. Left Kidney: Length: 7.8 cm. Echogenicity within normal limits. No mass or hydronephrosis visualized. Bladder: Appears normal for degree of bladder distention. IMPRESSION: Mild atrophy bilaterally.  No acute findings.  No hydronephrosis. Electronically Signed   By: Rolm Baptise M.D.   On: 10/25/2017 09:41   Dg Chest Port 1 View  Result Date: 11/03/2017 CLINICAL DATA:  83 year old female status post PICC placement. EXAM: PORTABLE CHEST 1 VIEW COMPARISON:  Chest radiograph dated 10/24/2017 FINDINGS: A right-sided PICC with tip at the cavoatrial junction noted. The lungs are clear. There is no pleural effusion or pneumothorax. The cardiac silhouette is within normal limits. There is coronary vascular calcification as well as atherosclerotic calcification of the thoracic aorta. No acute osseous pathology. IMPRESSION: 1. Right-sided PICC with tip at the cavoatrial junction. 2. No  acute cardiopulmonary process. Electronically Signed   By: Anner Crete M.D.   On: 11/03/2017 17:59   Portable Chest 1 View  Result Date: 10/24/2017 CLINICAL DATA:  Persistent cough.  Chest congestion. EXAM: PORTABLE CHEST 1 VIEW COMPARISON:  09/30/2017 and 09/25/2017 and 09/16/2017 FINDINGS: The heart size and mediastinal contours are within normal limits. Both lungs are clear. The visualized skeletal structures are unremarkable. IMPRESSION: Normal exam. Electronically Signed   By: Lorriane Shire M.D.   On: 10/24/2017 16:31       Subjective: No new complaints.   Discharge Exam: Vitals:   11/03/17 0506 11/03/17 1359  BP: (!) 116/57 107/62  Pulse: 72 74  Resp: 20 20  Temp: 98.2 F (36.8 C) 98.2 F (36.8 C)  SpO2: 93% 96%   Vitals:   11/02/17 1340 11/02/17 2041 11/03/17 0506 11/03/17 1359  BP: 98/61 (!) 101/51 (!) 116/57 107/62  Pulse: 74 65 72 74  Resp: 18 17 20 20   Temp: 98.4 F (36.9 C) 99.1 F (37.3 C) 98.2 F (36.8 C) 98.2 F (36.8 C)   TempSrc: Oral Oral Oral Oral  SpO2: 99% 95% 93% 96%  Weight:   70.7 kg (155 lb 13.8 oz)   Height:        General: Pt is alert, awake, not in acute distress Cardiovascular: RRR, S1/S2 +, no rubs, no gallops Respiratory: CTA bilaterally, no wheezing, no rhonchi Abdominal: Soft, NT, ND, bowel sounds + Extremities: no edema, no cyanosis    The results of significant diagnostics from this hospitalization (including imaging, microbiology, ancillary and laboratory) are listed below for reference.     Microbiology: No results found for this or any previous visit (from the past 240 hour(s)).   Labs: BNP (last 3 results) No results for input(s): BNP in the last 8760 hours. Basic Metabolic Panel: Recent Labs  Lab 11/03/17 0426  NA 133*  K 4.8  CL 108  CO2 20*  GLUCOSE 80  BUN 15  CREATININE 0.97  CALCIUM 8.4*  MG 1.4*  PHOS 2.8   Liver Function Tests: No results for input(s): AST, ALT, ALKPHOS, BILITOT, PROT, ALBUMIN in the last 168 hours. No results for input(s): LIPASE, AMYLASE in the last 168 hours. No results for input(s): AMMONIA in the last 168 hours. CBC: No results for input(s): WBC, NEUTROABS, HGB, HCT, MCV, PLT in the last 168 hours. Cardiac Enzymes: No results for input(s): CKTOTAL, CKMB, CKMBINDEX, TROPONINI in the last 168 hours. BNP: Invalid input(s): POCBNP CBG: No results for input(s): GLUCAP in the last 168 hours. D-Dimer No results for input(s): DDIMER in the last 72 hours. Hgb A1c No results for input(s): HGBA1C in the last 72 hours. Lipid Profile No results for input(s): CHOL, HDL, LDLCALC, TRIG, CHOLHDL, LDLDIRECT in the last 72 hours. Thyroid function studies No results for input(s): TSH, T4TOTAL, T3FREE, THYROIDAB in the last 72 hours.  Invalid input(s): FREET3 Anemia work up No results for input(s): VITAMINB12, FOLATE, FERRITIN, TIBC, IRON, RETICCTPCT in the last 72 hours. Urinalysis    Component Value Date/Time   COLORURINE AMBER (A)  10/24/2017 1900   APPEARANCEUR CLOUDY (A) 10/24/2017 1900   LABSPEC 1.013 10/24/2017 1900   PHURINE 5.0 10/24/2017 1900   GLUCOSEU NEGATIVE 10/24/2017 1900   HGBUR NEGATIVE 10/24/2017 1900   BILIRUBINUR NEGATIVE 10/24/2017 1900   KETONESUR NEGATIVE 10/24/2017 1900   PROTEINUR NEGATIVE 10/24/2017 1900   NITRITE NEGATIVE 10/24/2017 1900   LEUKOCYTESUR LARGE (A) 10/24/2017 1900   Sepsis Labs Invalid input(s): PROCALCITONIN,  WBC,  LACTICIDVEN Microbiology No results found for this or any previous visit (from the past 240 hour(s)).   Time coordinating discharge: Over 30 minutes  SIGNED:   Hosie Poisson, MD  [facility] 11/09/2017, 8:35 AM Pager   If 7PM-7AM, please contact night-coverage [email or web address] Password [email or web address]

## 2017-11-14 ENCOUNTER — Other Ambulatory Visit: Payer: Self-pay | Admitting: *Deleted

## 2017-11-14 ENCOUNTER — Ambulatory Visit (HOSPITAL_BASED_OUTPATIENT_CLINIC_OR_DEPARTMENT_OTHER): Payer: Medicare Other | Admitting: Oncology

## 2017-11-14 ENCOUNTER — Telehealth: Payer: Self-pay | Admitting: Oncology

## 2017-11-14 ENCOUNTER — Ambulatory Visit (HOSPITAL_BASED_OUTPATIENT_CLINIC_OR_DEPARTMENT_OTHER): Payer: Medicare Other

## 2017-11-14 ENCOUNTER — Other Ambulatory Visit: Payer: Self-pay

## 2017-11-14 ENCOUNTER — Encounter: Payer: Self-pay | Admitting: Oncology

## 2017-11-14 VITALS — BP 94/80 | HR 81 | Temp 97.2°F | Resp 17 | Ht 62.0 in | Wt 162.4 lb

## 2017-11-14 DIAGNOSIS — C172 Malignant neoplasm of ileum: Secondary | ICD-10-CM

## 2017-11-14 DIAGNOSIS — E860 Dehydration: Secondary | ICD-10-CM

## 2017-11-14 DIAGNOSIS — E039 Hypothyroidism, unspecified: Secondary | ICD-10-CM | POA: Diagnosis not present

## 2017-11-14 DIAGNOSIS — C762 Malignant neoplasm of abdomen: Secondary | ICD-10-CM

## 2017-11-14 LAB — COMPREHENSIVE METABOLIC PANEL
ALBUMIN: 2.8 g/dL — AB (ref 3.5–5.0)
ALT: 16 U/L (ref 0–55)
ANION GAP: 10 meq/L (ref 3–11)
AST: 19 U/L (ref 5–34)
Alkaline Phosphatase: 114 U/L (ref 40–150)
BUN: 11.3 mg/dL (ref 7.0–26.0)
CALCIUM: 8 mg/dL — AB (ref 8.4–10.4)
CO2: 24 meq/L (ref 22–29)
CREATININE: 1 mg/dL (ref 0.6–1.1)
Chloride: 103 mEq/L (ref 98–109)
EGFR: 49 mL/min/{1.73_m2} — ABNORMAL LOW (ref >60)
Glucose: 103 mg/dl (ref 70–140)
Potassium: 4.5 mEq/L (ref 3.5–5.1)
Sodium: 138 mEq/L (ref 136–145)
Total Bilirubin: 0.31 mg/dL (ref 0.20–1.20)
Total Protein: 6.7 g/dL (ref 6.4–8.3)

## 2017-11-14 LAB — MAGNESIUM: MAGNESIUM: 0.8 mg/dL — AB (ref 1.5–2.5)

## 2017-11-14 MED ORDER — DIPHENOXYLATE-ATROPINE 2.5-0.025 MG PO TABS: 1.0000 | ORAL_TABLET | Freq: Four times a day (QID) | ORAL | 1 refills | Status: DC

## 2017-11-14 MED ORDER — MAGNESIUM OXIDE 400 (241.3 MG) MG PO TABS: 400.0000 mg | ORAL_TABLET | Freq: Two times a day (BID) | ORAL | 0 refills | Status: DC

## 2017-11-14 NOTE — Telephone Encounter (Signed)
Scheduled appts per 1/2 los - patient did not want avs or calendar.

## 2017-11-14 NOTE — Progress Notes (Signed)
Patient here for follow up. She states she feels better.

## 2017-11-14 NOTE — Progress Notes (Signed)
magn

## 2017-11-14 NOTE — Progress Notes (Signed)
  [place] OFFICE PROGRESS NOTE   Diagnosis: Small bowel carcinoma  INTERVAL HISTORY:   Terri [name] was discharged from the hospital 11/03/2017 after a lengthy admission for management of a high output ileostomy.  She has continued to have high output from the ileostomy.  Tincture of opium caused hallucinations and she stopped this.  She does not feel the tincture helped with the diarrhea.  She notes the stool is thicker after taking Metamucil.  She is taking Lomotil 4 times per day.  Good appetite.  She is drinking Pedialyte.  Objective:  Vital signs in last 24 hours:  Blood pressure 94/80, pulse 81, temperature (!) 97.2 F (36.2 C), temperature source Oral, resp. rate 17, height 5\' 2"  (1.575 m), weight 162 lb 6.4 oz (73.7 kg), SpO2 98 %.    HEENT: No thrush, the mucous membranes are moist Resp: Lungs clear bilaterally Cardio: Regular rate and rhythm, 2/6 systolic murmur GI: No hepatomegaly, no apparent ascites, nontender, right abdomen ileostomy with liquid stool Vascular: No leg edema    Portacath/PICC-without erythema   Medications: I have reviewed the patient's current medications.   Assessment/Plan: 1. 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 pT4, pN1, pM1 ? CT abdomen/pelvis 09/16/2017-high-grade small bowel obstruction with transition point at the terminal ileum, nodularity of the omentum ? Diffuse peritoneal implants noted at the time of exploratory laparotomy 09/24/2017  2. Crohn's disease 3. Macular degeneration 4. Aortic insufficiency 5. History of a TIA 6. History of a positive PPD 7. History of a DVT following knee meniscus surgery 8. History of "typhoid "fever 9. Hypothyroid 10. Severe dehydration, renal failure?  10/24/2017- normalization of creatinine following intravenous hydration, severe dehydration secondary to a high output  ileostomy, improved  Disposition: [name] has been metastatic small bowel carcinoma.  She appears well-hydrated today.  She continues to have significant liquid output from the ileostomy.  She will continue the current bowel regimen and increase the Metamucil to 4 times per day.  We will check a chemistry panel today and decrease IV fluids to 500 cc/day if the chemistry panel is normal.  She continues to have leakage surrounding the ostomy wafer.  She requests a prescription for an abdominal binder.  I will contact Dr. Gershon Crane.  She will return for an office visit in 1 week.  The plan is to consider chemotherapy if her hydration status remains adequate.  25 minutes were spent with the patient today.  The majority of the time was used for counseling and coordination of care.  Betsy Coder, MD  11/14/2017  4:00 PM

## 2017-11-15 ENCOUNTER — Telehealth: Payer: Self-pay | Admitting: *Deleted

## 2017-11-15 NOTE — Telephone Encounter (Signed)
Message from pt's son reporting she is not willing to take metamucil more than TID. Son reports Lomotil is expensive due to not being on formulary. He contacted insurance company and was told the ordering provider needs to request formulary exception for Lomotil. Will forward request to managed care.

## 2017-11-19 ENCOUNTER — Encounter: Payer: Self-pay | Admitting: Oncology

## 2017-11-19 NOTE — Progress Notes (Signed)
Received PA request for Lomotil.  Submitted via Cover My meds:   Terri [name] (Key: [id number])   Your information has been submitted to [place]. [employer] [place] will review the request and notify you of the determination decision directly, typically within 3 business days of your submission and once all necessary information is received. You will also receive your request decision electronically. To check for an update later, open the request again from your dashboard. If [employer] has not responded within the specified timeframe or if you have any questions about your PA submission, contact [place] directly at [facility]) [phone] or ([place]) [phone].

## 2017-11-19 NOTE — Progress Notes (Signed)
Received approval for Lomotil:   Terri [name] Key: [id number] - Rx #: [id number] Need help? Call us at [phone]  Outcome  Approvedtoday  Effective from 11/19/2017 through 11/19/2018.  DrugDiphenoxylate-Atropine 2.5-0.025MG  OR TABS  FormBlue Cross [place] Medicare Part D General Authorization   Called [facility] pharmacy([facility]) to advise of approval. She states there isn't a refill in for it.

## 2017-11-20 ENCOUNTER — Inpatient Hospital Stay: Payer: Medicare Other

## 2017-11-20 ENCOUNTER — Telehealth: Payer: Self-pay

## 2017-11-20 ENCOUNTER — Telehealth: Payer: Self-pay | Admitting: Oncology

## 2017-11-20 ENCOUNTER — Inpatient Hospital Stay: Payer: Medicare Other | Attending: Oncology | Admitting: Oncology

## 2017-11-20 VITALS — BP 132/59 | HR 77 | Temp 98.6°F | Resp 20 | Ht 62.0 in | Wt 156.6 lb

## 2017-11-20 DIAGNOSIS — E860 Dehydration: Secondary | ICD-10-CM | POA: Diagnosis not present

## 2017-11-20 DIAGNOSIS — H3530 Unspecified macular degeneration: Secondary | ICD-10-CM

## 2017-11-20 DIAGNOSIS — C762 Malignant neoplasm of abdomen: Secondary | ICD-10-CM

## 2017-11-20 DIAGNOSIS — C172 Malignant neoplasm of ileum: Secondary | ICD-10-CM | POA: Diagnosis present

## 2017-11-20 DIAGNOSIS — K56609 Unspecified intestinal obstruction, unspecified as to partial versus complete obstruction: Secondary | ICD-10-CM | POA: Diagnosis not present

## 2017-11-20 DIAGNOSIS — Z452 Encounter for adjustment and management of vascular access device: Secondary | ICD-10-CM | POA: Insufficient documentation

## 2017-11-20 DIAGNOSIS — Z86718 Personal history of other venous thrombosis and embolism: Secondary | ICD-10-CM | POA: Diagnosis not present

## 2017-11-20 DIAGNOSIS — K5090 Crohn's disease, unspecified, without complications: Secondary | ICD-10-CM | POA: Diagnosis not present

## 2017-11-20 DIAGNOSIS — I351 Nonrheumatic aortic (valve) insufficiency: Secondary | ICD-10-CM | POA: Diagnosis not present

## 2017-11-20 DIAGNOSIS — E039 Hypothyroidism, unspecified: Secondary | ICD-10-CM | POA: Diagnosis not present

## 2017-11-20 DIAGNOSIS — Z8673 Personal history of transient ischemic attack (TIA), and cerebral infarction without residual deficits: Secondary | ICD-10-CM | POA: Diagnosis not present

## 2017-11-20 LAB — MAGNESIUM: Magnesium: 0.9 mg/dL — CL (ref 1.5–2.5)

## 2017-11-20 LAB — BASIC METABOLIC PANEL
ANION GAP: 9 (ref 3–11)
BUN: 17 mg/dL (ref 7–26)
CALCIUM: 7.4 mg/dL — AB (ref 8.4–10.4)
CO2: 27 mmol/L (ref 22–29)
Chloride: 99 mmol/L (ref 98–109)
Creatinine, Ser: 1.17 mg/dL — ABNORMAL HIGH (ref 0.60–1.10)
GFR calc non Af Amer: 42 mL/min — ABNORMAL LOW (ref >60)
GFR, EST AFRICAN AMERICAN: 48 mL/min — AB (ref >60)
Glucose, Bld: 126 mg/dL (ref 70–140)
Potassium: 4.5 mmol/L (ref 3.3–4.7)
Sodium: 135 mmol/L — ABNORMAL LOW (ref 136–145)

## 2017-11-20 MED ORDER — HEPARIN SOD (PORK) LOCK FLUSH 100 UNIT/ML IV SOLN
500.0000 [IU] | Freq: Once | INTRAVENOUS | Status: AC | PRN
  Administered 2017-11-20: 500 [IU] via INTRAVENOUS
  Filled 2017-11-20: qty 5

## 2017-11-20 MED ORDER — SODIUM CHLORIDE 0.9% FLUSH
10.0000 mL | INTRAVENOUS | Status: DC | PRN
Start: 2017-11-20 — End: 2017-11-20
  Administered 2017-11-20: 10 mL via INTRAVENOUS
  Filled 2017-11-20: qty 10

## 2017-11-20 NOTE — Telephone Encounter (Addendum)
Amy, pharmacist from [facility], called to verify oral Magnesium dose patient was prescribed. Magnesium-oxide 400mg  tab BID. Voiced understanding.   Amy called to recommend 12 mEq daily, wait 4 days and recheck levels and increase dosage if needed. [facility] pharmacy to add mag to IVF. Amy to enter orders and execute.This RN voiced understanding.

## 2017-11-20 NOTE — Progress Notes (Addendum)
[place] OFFICE PROGRESS NOTE   Diagnosis: Small bowel carcinoma  INTERVAL HISTORY:   Terri [name] returns as scheduled.  She continues 500 cc of IV fluids daily.  She is emptying the ileostomy bag every few hours and 1000-1500 cc of output daily.  Metamucil caused nausea. She had one episode of acute abdominal pain followed by a large bowel movement and leakage of stool over the abdomen.  The ileostomy wafer is fitting better.  Objective:  Vital signs in last 24 hours:  Blood pressure (!) 132/59, pulse 77, temperature 98.6 F (37 C), temperature source Oral, resp. rate 20, height 5' 2"  (1.575 m), weight 156 lb 9.6 oz (71 kg), SpO2 97 %.    HEENT: No thrush Resp: Lungs clear bilaterally Cardio: Regular rate and rhythm GI: No hepatomegaly, right lower quadrant ileostomy, nontender Vascular: Trace lower leg and foot edema bilaterally, the left lower leg is slightly larger than the right side    Portacath/PICC-without erythema  Lab Results:  Lab Results  Component Value Date   WBC 10.5 11/02/2017   HGB 9.2 (L) 11/02/2017   HCT 28.0 (L) 11/02/2017   MCV 86.7 11/02/2017   PLT 286 11/02/2017   NEUTROABS 7.5 10/28/2017    CMP     Component Value Date/Time   NA 135 (L) 11/20/2017 1437   NA 138 11/14/2017 1620   K 4.5 11/20/2017 1437   K 4.5 11/14/2017 1620   CL 99 11/20/2017 1437   CO2 27 11/20/2017 1437   CO2 24 11/14/2017 1620   GLUCOSE 126 11/20/2017 1437   GLUCOSE 103 11/14/2017 1620   BUN 17 11/20/2017 1437   BUN 11.3 11/14/2017 1620   CREATININE 1.17 (H) 11/20/2017 1437   CREATININE 1.0 11/14/2017 1620   CALCIUM 7.4 (L) 11/20/2017 1437   CALCIUM 8.0 (L) 11/14/2017 1620   PROT 6.7 11/14/2017 1620   ALBUMIN 2.8 (L) 11/14/2017 1620   AST 19 11/14/2017 1620   ALT 16 11/14/2017 1620   ALKPHOS 114 11/14/2017 1620   BILITOT 0.31 11/14/2017 1620   GFRNONAA 42 (L) 11/20/2017 1437   GFRAA 48 (L) 11/20/2017 1437    Lab Results  Component  Value Date   CEA1 6.49 (H) 10/24/2017    Lab Results  Component Value Date   INR 1.19 09/18/2017    Imaging:  No results found.  Medications: I have reviewed the patient's current medications.   Assessment/Plan: 1. 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 pT4, pN1, pM1 ? RAS wild-type, MSI-stable, mutation burden-6, IDH1 and ATM alterations ? CT abdomen/pelvis 09/16/2017-high-grade small bowel obstruction with transition point at the terminal ileum, nodularity of the omentum ? Diffuse peritoneal implants noted at the time of exploratory laparotomy 09/24/2017  2. Crohn's disease 3. Macular degeneration 4. Aortic insufficiency 5. History of a TIA 6. History of a positive PPD 7. History of a DVT following knee meniscus surgery 8. History of "typhoid "fever 9. Hypothyroid 10.Severe dehydration, renal failure? 10/24/2017- normalization of creatinine following intravenous hydration, severe dehydration secondary to a high output ileostomy, improved  Disposition: Her overall performance status continues to improve.  She appears mildly dehydrated today.  She will continue home IV fluids.  We will contact the [place] care pharmacy and request magnesium replacement.  We discussed systemic therapy options.  I am reluctant to consider single agent capecitabine secondary to the potential for significant diarrhea.  We discussed FOLFOX chemotherapy.  The plan is to initiate FOLFOX within the next few weeks if her performance status continues to improve.  She will return for an office and lab visit 11/30/2017.  We will asked Dr. Georgette Dover to place a Port-A-Cath for chemotherapy.  25 minutes were spent with the patient today.  The majority of the time was used for counseling and coordination of care.  Terri Coder, MD  11/20/2017  5:06 PM

## 2017-11-20 NOTE — Telephone Encounter (Signed)
err

## 2017-11-20 NOTE — Progress Notes (Signed)
Lab called to report a panic Magnesium of 0.9. MD aware.

## 2017-11-20 NOTE — Telephone Encounter (Signed)
Scheduled appt per 1/8 los - Gave patient AVS and calender per los.  

## 2017-11-22 ENCOUNTER — Other Ambulatory Visit: Payer: Self-pay

## 2017-11-22 ENCOUNTER — Telehealth: Payer: Self-pay

## 2017-11-22 DIAGNOSIS — E440 Moderate protein-calorie malnutrition: Secondary | ICD-10-CM

## 2017-11-22 MED ORDER — CHOLESTYRAMINE 4 G PO PACK: 4.0000 g | PACK | Freq: Two times a day (BID) | ORAL | 11 refills | Status: DC

## 2017-11-22 NOTE — Telephone Encounter (Signed)
Spoke with patient son to inform of prescription sent to pharmacy. Patient needs prescription sent to [facility] instead.   Called [facility] pharmacy to cancel fill.

## 2017-11-26 ENCOUNTER — Telehealth: Payer: Self-pay

## 2017-11-26 NOTE — Telephone Encounter (Signed)
Spoke with Amy, RN regarding labs. Magnesium to be drawn. Voiced understanding.

## 2017-11-29 ENCOUNTER — Other Ambulatory Visit: Payer: Self-pay | Admitting: *Deleted

## 2017-11-29 ENCOUNTER — Inpatient Hospital Stay (HOSPITAL_BASED_OUTPATIENT_CLINIC_OR_DEPARTMENT_OTHER): Payer: Medicare Other | Admitting: Medical

## 2017-11-29 ENCOUNTER — Other Ambulatory Visit: Payer: Self-pay | Admitting: Medical

## 2017-11-29 ENCOUNTER — Inpatient Hospital Stay (HOSPITAL_COMMUNITY)
Admission: AD | Admit: 2017-11-29 | Discharge: 2017-12-01 | DRG: 389 | Disposition: A | Discharge status: Home Health | Payer: Medicare Other | Source: Ambulatory Visit | Attending: Internal Medicine | Admitting: Internal Medicine

## 2017-11-29 ENCOUNTER — Telehealth: Payer: Self-pay

## 2017-11-29 ENCOUNTER — Ambulatory Visit (HOSPITAL_COMMUNITY)
Admission: RE | Admit: 2017-11-29 | Discharge: 2017-11-29 | Disposition: A | Discharge status: Home / Self Care | Payer: Medicare Other | Source: Ambulatory Visit | Attending: Medical | Admitting: Medical

## 2017-11-29 ENCOUNTER — Encounter (HOSPITAL_COMMUNITY): Payer: Self-pay | Admitting: *Deleted

## 2017-11-29 ENCOUNTER — Inpatient Hospital Stay: Payer: Medicare Other

## 2017-11-29 VITALS — BP 147/81 | HR 91 | Temp 98.2°F

## 2017-11-29 DIAGNOSIS — N179 Acute kidney failure, unspecified: Secondary | ICD-10-CM | POA: Diagnosis present

## 2017-11-29 DIAGNOSIS — I4891 Unspecified atrial fibrillation: Secondary | ICD-10-CM | POA: Diagnosis present

## 2017-11-29 DIAGNOSIS — Z6828 Body mass index (BMI) 28.0-28.9, adult: Secondary | ICD-10-CM | POA: Diagnosis not present

## 2017-11-29 DIAGNOSIS — I352 Nonrheumatic aortic (valve) stenosis with insufficiency: Secondary | ICD-10-CM | POA: Diagnosis present

## 2017-11-29 DIAGNOSIS — Z87891 Personal history of nicotine dependence: Secondary | ICD-10-CM | POA: Diagnosis not present

## 2017-11-29 DIAGNOSIS — Z932 Ileostomy status: Secondary | ICD-10-CM

## 2017-11-29 DIAGNOSIS — K56609 Unspecified intestinal obstruction, unspecified as to partial versus complete obstruction: Secondary | ICD-10-CM

## 2017-11-29 DIAGNOSIS — Z86718 Personal history of other venous thrombosis and embolism: Secondary | ICD-10-CM

## 2017-11-29 DIAGNOSIS — Z961 Presence of intraocular lens: Secondary | ICD-10-CM | POA: Diagnosis present

## 2017-11-29 DIAGNOSIS — K219 Gastro-esophageal reflux disease without esophagitis: Secondary | ICD-10-CM | POA: Diagnosis present

## 2017-11-29 DIAGNOSIS — R1114 Bilious vomiting: Secondary | ICD-10-CM

## 2017-11-29 DIAGNOSIS — J45909 Unspecified asthma, uncomplicated: Secondary | ICD-10-CM | POA: Diagnosis present

## 2017-11-29 DIAGNOSIS — K5090 Crohn's disease, unspecified, without complications: Secondary | ICD-10-CM | POA: Diagnosis present

## 2017-11-29 DIAGNOSIS — Z803 Family history of malignant neoplasm of breast: Secondary | ICD-10-CM | POA: Diagnosis not present

## 2017-11-29 DIAGNOSIS — E860 Dehydration: Secondary | ICD-10-CM | POA: Diagnosis present

## 2017-11-29 DIAGNOSIS — H548 Legal blindness, as defined in USA: Secondary | ICD-10-CM | POA: Diagnosis present

## 2017-11-29 DIAGNOSIS — E039 Hypothyroidism, unspecified: Secondary | ICD-10-CM | POA: Diagnosis present

## 2017-11-29 DIAGNOSIS — Z85828 Personal history of other malignant neoplasm of skin: Secondary | ICD-10-CM

## 2017-11-29 DIAGNOSIS — C786 Secondary malignant neoplasm of retroperitoneum and peritoneum: Secondary | ICD-10-CM | POA: Diagnosis present

## 2017-11-29 DIAGNOSIS — H9190 Unspecified hearing loss, unspecified ear: Secondary | ICD-10-CM | POA: Diagnosis present

## 2017-11-29 DIAGNOSIS — C172 Malignant neoplasm of ileum: Secondary | ICD-10-CM | POA: Diagnosis not present

## 2017-11-29 DIAGNOSIS — I10 Essential (primary) hypertension: Secondary | ICD-10-CM | POA: Diagnosis present

## 2017-11-29 DIAGNOSIS — E440 Moderate protein-calorie malnutrition: Secondary | ICD-10-CM | POA: Diagnosis present

## 2017-11-29 DIAGNOSIS — D72829 Elevated white blood cell count, unspecified: Secondary | ICD-10-CM | POA: Diagnosis present

## 2017-11-29 DIAGNOSIS — Z9841 Cataract extraction status, right eye: Secondary | ICD-10-CM

## 2017-11-29 DIAGNOSIS — Z7952 Long term (current) use of systemic steroids: Secondary | ICD-10-CM

## 2017-11-29 DIAGNOSIS — Z9049 Acquired absence of other specified parts of digestive tract: Secondary | ICD-10-CM

## 2017-11-29 DIAGNOSIS — Z7902 Long term (current) use of antithrombotics/antiplatelets: Secondary | ICD-10-CM | POA: Diagnosis not present

## 2017-11-29 DIAGNOSIS — H353130 Nonexudative age-related macular degeneration, bilateral, stage unspecified: Secondary | ICD-10-CM | POA: Diagnosis present

## 2017-11-29 DIAGNOSIS — G459 Transient cerebral ischemic attack, unspecified: Secondary | ICD-10-CM | POA: Diagnosis not present

## 2017-11-29 DIAGNOSIS — Z85118 Personal history of other malignant neoplasm of bronchus and lung: Secondary | ICD-10-CM

## 2017-11-29 DIAGNOSIS — Z8052 Family history of malignant neoplasm of bladder: Secondary | ICD-10-CM

## 2017-11-29 DIAGNOSIS — Z884 Allergy status to anesthetic agent status: Secondary | ICD-10-CM

## 2017-11-29 DIAGNOSIS — Z7951 Long term (current) use of inhaled steroids: Secondary | ICD-10-CM

## 2017-11-29 DIAGNOSIS — Z66 Do not resuscitate: Secondary | ICD-10-CM | POA: Diagnosis present

## 2017-11-29 DIAGNOSIS — K5669 Other partial intestinal obstruction: Secondary | ICD-10-CM | POA: Diagnosis present

## 2017-11-29 DIAGNOSIS — K5010 Crohn's disease of large intestine without complications: Secondary | ICD-10-CM

## 2017-11-29 DIAGNOSIS — Z8711 Personal history of peptic ulcer disease: Secondary | ICD-10-CM

## 2017-11-29 DIAGNOSIS — C179 Malignant neoplasm of small intestine, unspecified: Secondary | ICD-10-CM | POA: Diagnosis present

## 2017-11-29 DIAGNOSIS — F329 Major depressive disorder, single episode, unspecified: Secondary | ICD-10-CM | POA: Diagnosis present

## 2017-11-29 DIAGNOSIS — Z8673 Personal history of transient ischemic attack (TIA), and cerebral infarction without residual deficits: Secondary | ICD-10-CM

## 2017-11-29 DIAGNOSIS — Z79899 Other long term (current) drug therapy: Secondary | ICD-10-CM

## 2017-11-29 DIAGNOSIS — M19042 Primary osteoarthritis, left hand: Secondary | ICD-10-CM | POA: Diagnosis present

## 2017-11-29 DIAGNOSIS — Z9842 Cataract extraction status, left eye: Secondary | ICD-10-CM

## 2017-11-29 DIAGNOSIS — M19041 Primary osteoarthritis, right hand: Secondary | ICD-10-CM | POA: Diagnosis present

## 2017-11-29 LAB — CMP (CANCER CENTER ONLY)
ALT: 24 U/L (ref 0–55)
ANION GAP: 11 (ref 3–11)
AST: 21 U/L (ref 5–34)
Albumin: 2.9 g/dL — ABNORMAL LOW (ref 3.5–5.0)
Alkaline Phosphatase: 109 U/L (ref 40–150)
BUN: 18 mg/dL (ref 7–26)
CHLORIDE: 96 mmol/L — AB (ref 98–109)
CO2: 26 mmol/L (ref 22–29)
Calcium: 9.2 mg/dL (ref 8.4–10.4)
Creatinine: 1.21 mg/dL — ABNORMAL HIGH (ref 0.60–1.10)
GFR, Est AFR Am: 46 mL/min — ABNORMAL LOW (ref >60)
GFR, Estimated: 40 mL/min — ABNORMAL LOW (ref >60)
Glucose, Bld: 122 mg/dL (ref 70–140)
POTASSIUM: 4.8 mmol/L — AB (ref 3.3–4.7)
SODIUM: 133 mmol/L — AB (ref 136–145)
Total Bilirubin: 0.7 mg/dL (ref 0.2–1.2)
Total Protein: 6.8 g/dL (ref 6.4–8.3)

## 2017-11-29 LAB — CBC WITH DIFFERENTIAL (CANCER CENTER ONLY)
BASOS PCT: 1 %
Basophils Absolute: 0.1 10*3/uL (ref 0.0–0.1)
EOS ABS: 0.1 10*3/uL (ref 0.0–0.5)
EOS PCT: 1 %
HCT: 38.3 % (ref 34.8–46.6)
Hemoglobin: 12.4 g/dL (ref 11.6–15.9)
LYMPHS ABS: 3.2 10*3/uL (ref 0.9–3.3)
Lymphocytes Relative: 22 %
MCH: 27.6 pg (ref 25.1–34.0)
MCHC: 32.3 g/dL (ref 31.5–36.0)
MCV: 85.6 fL (ref 79.5–101.0)
MONOS PCT: 11 %
Monocytes Absolute: 1.6 10*3/uL — ABNORMAL HIGH (ref 0.1–0.9)
Neutro Abs: 9.8 10*3/uL — ABNORMAL HIGH (ref 1.5–6.5)
Neutrophils Relative %: 65 %
PLATELETS: 299 10*3/uL (ref 145–400)
RBC: 4.47 MIL/uL (ref 3.70–5.45)
RDW: 17.4 % — ABNORMAL HIGH (ref 11.2–16.1)
WBC Count: 14.9 10*3/uL — ABNORMAL HIGH (ref 3.9–10.3)

## 2017-11-29 LAB — MAGNESIUM: Magnesium: 2.3 mg/dL (ref 1.5–2.5)

## 2017-11-29 MED ORDER — DIPHENOXYLATE-ATROPINE 2.5-0.025 MG PO TABS
2.0000 | ORAL_TABLET | Freq: Four times a day (QID) | ORAL | Status: DC
  Administered 2017-11-30 – 2017-12-01 (×6): 2 via ORAL
  Filled 2017-11-29 (×6): qty 2

## 2017-11-29 MED ORDER — PREMIER PROTEIN SHAKE
11.0000 [oz_av] | ORAL | Status: DC
  Filled 2017-11-29 (×2): qty 325.31

## 2017-11-29 MED ORDER — SODIUM CHLORIDE 0.9 % IV SOLN
INTRAVENOUS | Status: DC
  Administered 2017-11-29: 15:00:00 via INTRAVENOUS

## 2017-11-29 MED ORDER — LEVOTHYROXINE SODIUM 100 MCG PO TABS
100.0000 ug | ORAL_TABLET | Freq: Every day | ORAL | Status: DC
  Administered 2017-11-30 – 2017-12-01 (×2): 100 ug via ORAL
  Filled 2017-11-29 (×2): qty 1

## 2017-11-29 MED ORDER — SALINE SPRAY 0.65 % NA SOLN
2.0000 | NASAL | Status: DC | PRN
  Filled 2017-11-29: qty 44

## 2017-11-29 MED ORDER — ONDANSETRON HCL 40 MG/20ML IJ SOLN: Freq: Once | INTRAMUSCULAR | Status: DC

## 2017-11-29 MED ORDER — HEPARIN SODIUM (PORCINE) 5000 UNIT/ML IJ SOLN
5000.0000 [IU] | Freq: Three times a day (TID) | INTRAMUSCULAR | Status: DC
  Administered 2017-11-30 – 2017-12-01 (×3): 5000 [IU] via SUBCUTANEOUS
  Filled 2017-11-29 (×3): qty 1

## 2017-11-29 MED ORDER — MESALAMINE ER 250 MG PO CPCR
1500.0000 mg | ORAL_CAPSULE | Freq: Two times a day (BID) | ORAL | Status: DC
  Administered 2017-11-30 – 2017-12-01 (×3): 1500 mg via ORAL
  Filled 2017-11-29 (×4): qty 6

## 2017-11-29 MED ORDER — CHOLESTYRAMINE 4 G PO PACK
4.0000 g | PACK | Freq: Two times a day (BID) | ORAL | Status: DC
  Administered 2017-12-01: 4 g via ORAL
  Filled 2017-11-29 (×4): qty 1

## 2017-11-29 MED ORDER — ADULT MULTIVITAMIN W/MINERALS CH
1.0000 | ORAL_TABLET | Freq: Every day | ORAL | Status: DC
  Administered 2017-11-30 – 2017-12-01 (×2): 1 via ORAL
  Filled 2017-11-29 (×2): qty 1

## 2017-11-29 MED ORDER — DEXTROSE-NACL 5-0.45 % IV SOLN
INTRAVENOUS | Status: DC
  Administered 2017-11-29 – 2017-11-30 (×2): via INTRAVENOUS

## 2017-11-29 MED ORDER — FOLIC ACID 1 MG PO TABS
1.0000 mg | ORAL_TABLET | Freq: Every day | ORAL | Status: DC
  Administered 2017-11-30 – 2017-12-01 (×2): 1 mg via ORAL
  Filled 2017-11-29 (×2): qty 1

## 2017-11-29 MED ORDER — CILIDINIUM-CHLORDIAZEPOXIDE 2.5-5 MG PO CAPS
1.0000 | ORAL_CAPSULE | Freq: Every day | ORAL | Status: DC
  Administered 2017-11-30 – 2017-12-01 (×2): 1 via ORAL
  Filled 2017-11-29 (×2): qty 1

## 2017-11-29 MED ORDER — PSYLLIUM 95 % PO PACK
1.0000 | PACK | Freq: Two times a day (BID) | ORAL | Status: DC
  Administered 2017-11-30 – 2017-12-01 (×2): 1 via ORAL
  Filled 2017-11-29 (×3): qty 1

## 2017-11-29 MED ORDER — SENNOSIDES-DOCUSATE SODIUM 8.6-50 MG PO TABS: 1.0000 | ORAL_TABLET | Freq: Every evening | ORAL | Status: DC | PRN

## 2017-11-29 MED ORDER — PREDNISONE 5 MG PO TABS
15.0000 mg | ORAL_TABLET | Freq: Every day | ORAL | Status: DC
  Administered 2017-11-30 – 2017-12-01 (×2): 15 mg via ORAL
  Filled 2017-11-29 (×2): qty 3

## 2017-11-29 MED ORDER — POLYVINYL ALCOHOL 1.4 % OP SOLN
1.0000 [drp] | Freq: Every day | OPHTHALMIC | Status: DC | PRN
  Filled 2017-11-29: qty 15

## 2017-11-29 MED ORDER — INSULIN ASPART 100 UNIT/ML ~~LOC~~ SOLN: 0.0000 [IU] | Freq: Three times a day (TID) | SUBCUTANEOUS | Status: DC

## 2017-11-29 MED ORDER — ONDANSETRON HCL 4 MG/2ML IJ SOLN: 4.0000 mg | Freq: Four times a day (QID) | INTRAMUSCULAR | Status: DC | PRN

## 2017-11-29 MED ORDER — ACETAMINOPHEN 325 MG PO TABS: 650.0000 mg | ORAL_TABLET | Freq: Four times a day (QID) | ORAL | Status: DC | PRN

## 2017-11-29 MED ORDER — LOPERAMIDE HCL 2 MG PO CAPS
2.0000 mg | ORAL_CAPSULE | Freq: Three times a day (TID) | ORAL | Status: DC
  Administered 2017-11-30 – 2017-12-01 (×6): 2 mg via ORAL
  Filled 2017-11-29 (×6): qty 1

## 2017-11-29 MED ORDER — OXYBUTYNIN CHLORIDE 5 MG PO TABS
5.0000 mg | ORAL_TABLET | Freq: Every day | ORAL | Status: DC
  Administered 2017-11-30 – 2017-12-01 (×2): 5 mg via ORAL
  Filled 2017-11-29 (×2): qty 1

## 2017-11-29 MED ORDER — ACETAMINOPHEN 650 MG RE SUPP: 650.0000 mg | Freq: Four times a day (QID) | RECTAL | Status: DC | PRN

## 2017-11-29 MED ORDER — ONDANSETRON HCL 4 MG/2ML IJ SOLN
8.0000 mg | Freq: Once | INTRAMUSCULAR | Status: AC
  Administered 2017-11-29: 8 mg via INTRAVENOUS

## 2017-11-29 MED ORDER — ESCITALOPRAM OXALATE 10 MG PO TABS
5.0000 mg | ORAL_TABLET | Freq: Every day | ORAL | Status: DC
  Administered 2017-11-30 – 2017-12-01 (×2): 5 mg via ORAL
  Filled 2017-11-29 (×2): qty 1

## 2017-11-29 MED ORDER — PANTOPRAZOLE SODIUM 40 MG PO TBEC
40.0000 mg | DELAYED_RELEASE_TABLET | Freq: Every morning | ORAL | Status: DC
  Administered 2017-11-30 – 2017-12-01 (×2): 40 mg via ORAL
  Filled 2017-11-29 (×2): qty 1

## 2017-11-29 MED ORDER — CLOPIDOGREL BISULFATE 75 MG PO TABS
75.0000 mg | ORAL_TABLET | Freq: Every day | ORAL | Status: DC
  Administered 2017-11-30 – 2017-12-01 (×2): 75 mg via ORAL
  Filled 2017-11-29 (×2): qty 1

## 2017-11-29 MED ORDER — ONDANSETRON HCL 4 MG PO TABS: 4.0000 mg | ORAL_TABLET | Freq: Four times a day (QID) | ORAL | Status: DC | PRN

## 2017-11-29 MED ORDER — OXYCODONE-ACETAMINOPHEN 5-325 MG PO TABS: 1.0000 | ORAL_TABLET | ORAL | Status: DC | PRN

## 2017-11-29 MED ORDER — ALBUTEROL SULFATE (2.5 MG/3ML) 0.083% IN NEBU: 3.0000 mL | INHALATION_SOLUTION | Freq: Four times a day (QID) | RESPIRATORY_TRACT | Status: DC | PRN

## 2017-11-29 NOTE — Telephone Encounter (Signed)
Spoke with pt son. States pt was "spitting up" last night. Pt was "gagging and a little nauseous, she took zofran and it helped". Now she has "bilious vomiting" , "not a large volume". Patient son states he did not "notice any output from stoma". He did a abdominal assessment and there was no pain upon pressing, and "stoma produced after pressing on abdomen". Dr. Benay Spice made aware.

## 2017-11-29 NOTE — Progress Notes (Signed)
[facility] Progress Note   Terri [name] [id number] 1931-07-09 84 y.o.  Terri [name] is managed by Dr. Ladell Pier  Actively treated with chemotherapy: no  Assessment: Plan:    Small bowel obstruction ([place]) - Plan: 0.9 %  sodium chloride infusion, ondansetron (ZOFRAN) injection 8 mg, DISCONTINUED: ondansetron (ZOFRAN) 8 mg in sodium chloride 0.9 % 50 mL IVPB   Small bowel obstruction in a setting of a metastatic small bowel carcinoma: Terri [name] is being admitted to [facility] for management of her small bowel obstruction.  Dr. Doyle Askew has graciously agreed to accept this patient under her care.  Dr. Benay Spice has contacted surgery to asked that they see the patient while she is hospitalized.  Please see After Visit Summary for patient specific instructions.  Future Appointments  Date Time Provider [place]  12/11/2017  2:20 PM Minus Breeding, MD [facility]    No orders of the defined types were placed in this encounter.      Subjective:   Patient ID:  Terri [name] is a 84 y.o. (DOB 03-25-1931) female.  Chief Complaint:  Chief Complaint  Patient presents with  . Emesis    HPI Terri [name] is a 84 year old female with a history of a metastatic small bowel carcinoma arising from the terminal ileum.  She is status post an omentectomy and ileal cecectomy/end ileostomy on 09/24/2017.  She was staged as a pT4, pN1, pM1 with her pathology showing a RAS wild-type, MSI-stable, mutation burden-6, IDH1 and ATM alterations.  A CT scan of her  abdomen/pelvis from 09/16/2017 had shown a high-grade small bowel obstruction with transition point at the terminal ileum and nodularity of the omentum.  Exploratory laparotomy from 09/24/2017 indicated diffuse peritoneal implants. Terri [name] was last seen by Dr. Ladell Pier on 11/20/2017.  He had to discuss the use of FOLFOX with the patient.  Plans initially were for her to return  to the office on 11/30/2017 after a port placement.  Her office was contacted by the patient's son who is a pediatrician today stating that the patient had been "spitting up" last night.  She was gagging and a little nauseous.  She took Zofran which helped.  She was having bilious vomiting this morning.  It was not a large volume however.  The patient's son did not notice any output from her stoma.  He examined her abdomen and did not note pain with palpitation.  He reports that her stoma did produce output after he pressed on her abdomen.  She was instructed to present to the hospital today for an abdominal x-ray, labs and to be evaluated.  A KUB was completed and returned showing a small bowel obstruction.  Medications: I have reviewed the patient's current medications.  Allergies:  Allergies  Allergen Reactions  . Lidocaine     Claims that it made her heart beat go irregular    Past Medical History:  Diagnosis Date  . Age-related macular degeneration, dry, both eyes   . Aortic insufficiency   . Aortic stenosis   . Arthritis    "hands" (05/31/2017)  . Asthma   . Basal cell carcinoma of nose   . Crohn's disease ([place])   . GERD (gastroesophageal reflux disease)   . History of duodenal ulcer   . History of stomach ulcers   . HOH (hard of hearing)   . Hx of rheumatic fever   . Hypertension   . Hypothyroidism   . Positive TB test   .  Rheumatic heart disease   . Small bowel obstruction ([place]) 05/31/2017  . Squamous cell cancer of skin of nose   . TIA (transient ischemic attack)   . TIA (transient ischemic attack) 1999?    Past Surgical History:  Procedure Laterality Date  . BASAL CELL CARCINOMA EXCISION  X 2   "face"  . CARPAL TUNNEL RELEASE Right   . CATARACT EXTRACTION W/ INTRAOCULAR LENS IMPLANT Bilateral   . CHOLECYSTECTOMY    . ILEOCECETOMY N/A 09/24/2017   Procedure: Pennie Rushing;  Surgeon: Donnie Mesa, MD;  Location: [facility];  Service: General;  Laterality: N/A;  .  ILEOSTOMY N/A 09/24/2017   Procedure: ILEOSTOMY;  Surgeon: Donnie Mesa, MD;  Location: [place];  Service: General;  Laterality: N/A;  . LAPAROTOMY N/A 09/24/2017   Procedure: EXPLORATORY LAPAROTOMY;  Surgeon: Donnie Mesa, MD;  Location: [place];  Service: General;  Laterality: N/A;  . OMENTECTOMY N/A 09/24/2017   Procedure: OMENTECTOMY;  Surgeon: Donnie Mesa, MD;  Location: [place];  Service: General;  Laterality: N/A;  . SQUAMOUS CELL CARCINOMA EXCISION  X1   "face"  . TONSILLECTOMY      Family History  Problem Relation Age of Onset  . Bladder Cancer Brother   . Breast cancer Daughter   . Crohn's disease Neg Hx     Social History   Socioeconomic History  . Marital status: Widowed    Spouse name: Not on file  . Number of children: Not on file  . Years of education: Not on file  . Highest education level: Not on file  Social Needs  . Financial resource strain: Not on file  . Food insecurity - worry: Not on file  . Food insecurity - inability: Not on file  . Transportation needs - medical: Not on file  . Transportation needs - non-medical: Not on file  Occupational History  . Not on file  Tobacco Use  . Smoking status: Former Smoker    Packs/day: 0.10    Years: 15.00    Pack years: 1.50    Types: Cigarettes    Last attempt to quit: 1969    Years since quitting: 50.0  . Smokeless tobacco: Never Used  Substance and Sexual Activity  . Alcohol use: No  . Drug use: No  . Sexual activity: Not Currently  Other Topics Concern  . Not on file  Social History Narrative   Widowed 12/2016. Living with son. Retired Therapist, sports     Past Medical History, Surgical history, Social history, and Family history were reviewed and updated as appropriate.   Please see review of systems for further details on the patient's review from today.   Review of Systems:  Review of Systems  Constitutional: Negative for appetite change, chills, diaphoresis and fever.  HENT: Negative for trouble  swallowing.   Respiratory: Negative for cough, choking, chest tightness and shortness of breath.   Cardiovascular: Negative for chest pain and palpitations.  Gastrointestinal: Positive for abdominal distention and nausea. Negative for abdominal pain, constipation, diarrhea and vomiting.    Objective:   Physical Exam:  BP (!) 147/81 (BP Location: Right Arm, Patient Position: Sitting)   Pulse 91   Temp 98.2 F (36.8 C) (Oral)   SpO2 99%  ECOG: 1   Physical Exam  Constitutional: No distress.  HENT:  Head: Normocephalic and atraumatic.  Mouth/Throat: Oropharynx is clear and moist. No oropharyngeal exudate.  Cardiovascular: S1 normal and S2 normal. Tachycardia present.  Pulmonary/Chest: Effort normal and breath sounds normal. No respiratory  distress. She has no wheezes. She has no rales.  Abdominal:    Musculoskeletal: She exhibits edema (Trace bilateral pitting edema to the knees.).  Neurological: She is alert.  Skin: Skin is warm and dry. She is not diaphoretic.  Increased tenting of the dorsal surface of the hands bilaterally.    Lab Review:     Component Value Date/Time   NA 133 (L) 11/29/2017 1139   NA 138 11/14/2017 1620   K 4.8 (H) 11/29/2017 1139   K 4.5 11/14/2017 1620   CL 96 (L) 11/29/2017 1139   CO2 26 11/29/2017 1139   CO2 24 11/14/2017 1620   GLUCOSE 122 11/29/2017 1139   GLUCOSE 103 11/14/2017 1620   BUN 18 11/29/2017 1139   BUN 11.3 11/14/2017 1620   CREATININE 1.17 (H) 11/20/2017 1437   CREATININE 1.0 11/14/2017 1620   CALCIUM 9.2 11/29/2017 1139   CALCIUM 8.0 (L) 11/14/2017 1620   PROT 6.8 11/29/2017 1139   PROT 6.7 11/14/2017 1620   ALBUMIN 2.9 (L) 11/29/2017 1139   ALBUMIN 2.8 (L) 11/14/2017 1620   AST 21 11/29/2017 1139   AST 19 11/14/2017 1620   ALT 24 11/29/2017 1139   ALT 16 11/14/2017 1620   ALKPHOS 109 11/29/2017 1139   ALKPHOS 114 11/14/2017 1620   BILITOT 0.7 11/29/2017 1139   BILITOT 0.31 11/14/2017 1620   GFRNONAA 40 (L)  11/29/2017 1139   GFRAA 46 (L) 11/29/2017 1139       Component Value Date/Time   WBC 14.9 (H) 11/29/2017 1139   WBC 10.5 11/02/2017 0459   RBC 4.47 11/29/2017 1139   HGB 9.2 (L) 11/02/2017 0459   HGB 13.7 10/24/2017 1155   HCT 38.3 11/29/2017 1139   HCT 42.1 10/24/2017 1155   PLT 299 11/29/2017 1139   PLT 312 10/24/2017 1155   MCV 85.6 11/29/2017 1139   MCV 85.0 10/24/2017 1155   MCH 27.6 11/29/2017 1139   MCHC 32.3 11/29/2017 1139   RDW 17.4 (H) 11/29/2017 1139   RDW 15.5 (H) 10/24/2017 1155   LYMPHSABS 3.2 11/29/2017 1139   LYMPHSABS 2.2 10/24/2017 1155   MONOABS 1.6 (H) 11/29/2017 1139   MONOABS 1.0 (H) 10/24/2017 1155   EOSABS 0.1 11/29/2017 1139   EOSABS 0.0 10/24/2017 1155   BASOSABS 0.1 11/29/2017 1139   BASOSABS 0.0 10/24/2017 1155   -------------------------------  Imaging from last 24 hours (if applicable):  Radiology interpretation: Dg Chest Port 1 View  Result Date: 11/03/2017 CLINICAL DATA:  84 year old female status post PICC placement. EXAM: PORTABLE CHEST 1 VIEW COMPARISON:  Chest radiograph dated 10/24/2017 FINDINGS: A right-sided PICC with tip at the cavoatrial junction noted. The lungs are clear. There is no pleural effusion or pneumothorax. The cardiac silhouette is within normal limits. There is coronary vascular calcification as well as atherosclerotic calcification of the thoracic aorta. No acute osseous pathology. IMPRESSION: 1. Right-sided PICC with tip at the cavoatrial junction. 2. No acute cardiopulmonary process. Electronically Signed   By: Anner Crete M.D.   On: 11/03/2017 17:59   Dg Abd 2 Views  Result Date: 11/29/2017 CLINICAL DATA:  Evaluate for obstruction. EXAM: ABDOMEN - 2 VIEW COMPARISON:  09/23/2017, 09/21/2017.  CT 09/19/2017. FINDINGS: Dilated loops of small bowel are noted. Colon is nondistended. Findings again consistent with small-bowel obstruction. Similar findings noted on prior KUB of 09/23/2017 and CT 09/19/2017. No  definite free air noted on today's exam. Degenerative changes scoliosis thoracolumbar spine. Pelvic calcifications consistent phleboliths. IMPRESSION: Findings consistent with small-bowel obstruction.  Electronically Signed   By: Marcello Moores  Register   On: 11/29/2017 11:00        This patient was seen with Dr. Benay Spice with my treatment plan reviewed with him. He expressed agreement with my medical management of this patient.  This was a shared visit with Sandi Mealy.  Ms. [name] was interviewed and examined.  She noted decreased output from the ostomy today.  This was associated with nausea and vomiting.  She had liquid output from the ileostomy at the Cancer center today.  A plain x-ray is consistent with a bowel obstruction.  She will be admitted for intravenous hydration and further evaluation.  I discussed the differential diagnosis with her family.  They understand she could be developing a bowel obstruction secondary to carcinomatosis.  She is not a candidate to begin systemic chemotherapy at present.  She will be admitted for further evaluation.  Julieanne Manson, MD

## 2017-11-29 NOTE — Progress Notes (Signed)
Pt has had output from her ileostomy bag several times this afternoon. Still feels nauseated and only able to drink a few sips of gingerale before start retching and vomiting bile colored stomach contents.  Pt. To be admitted for small bowel obstruction. Report called to Wells Guiles, RN on [place], room # [phone]. Picc line intact, capped, flushed. Transported via w/c per Jarvis Morgan, RN

## 2017-11-29 NOTE — Progress Notes (Signed)
Pt at the cancer center today, has ileostomy, almost no output in the past 24 hours, had bilious vomiting and abd firm. KUB with small bowel obstruction. Son is pediatrician and he though stomach was softer and he has noted some output in ostomy today. Dr. Benay Spice called surgery. [facility] will admit to medical unit. Please call floor manager when patient arrives [phone].Will also need to call surgery when pt arrives. Medical bed appropriate.   Faye Ramsay, MD  [facility] Pager [phone]  If 7PM-7AM, please contact night-coverage [email or web address] Password [email or web address]

## 2017-11-29 NOTE — Progress Notes (Signed)
Patient arrived on unit via wheelchair from cancer center.  No family at bedside.  Admitting & admission RN notified of patient's arrival.

## 2017-11-29 NOTE — H&P (Signed)
History and Physical    Terri [name] PZW:[id number] DOB: 1931/04/14 DOA: 11/29/2017  PCP: Prince Solian, MD Patient coming from: Oncology office  Chief Complaint: Abdominal pain  HPI: Terri [name] is a 84 y.o. 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  Up until a few days ago patient has been dealing with high ileostomy output but over the last couple of days has noticed decrease in output and started experiencing nausea and vomiting last night and this morning.  Due to the symptoms she was seen by oncology today and on the x-ray she was noted to have small bowel obstruction.  Patient denied any fevers, chills and other complaints.  It was determined to admit her for further care.  Patient was seen and examined by me on the floor upon arrival.  Patient denied any current symptoms of nausea and vomiting but states if she tries to eat anything she will become very nauseous.  No other complaints at the moment.  Review of Systems: As per HPI otherwise 10 point review of systems negative.   Past Medical History:  Diagnosis Date  . Age-related macular degeneration, dry, both eyes   . Aortic insufficiency   . Aortic stenosis   . Arthritis    "hands" (05/31/2017)  . Asthma   . Basal cell carcinoma of nose   . Crohn's disease ([place])   . GERD (gastroesophageal reflux disease)   . History of duodenal ulcer   . History of stomach ulcers   . HOH (hard of hearing)   . Hx of rheumatic fever   . Hypertension   . Hypothyroidism   . Positive TB test   . Rheumatic heart disease   . Small bowel obstruction ([place]) 05/31/2017  . Squamous cell cancer of skin of nose   . TIA (transient ischemic attack)   . TIA (transient ischemic attack) 1999?    Past Surgical History:  Procedure  Laterality Date  . BASAL CELL CARCINOMA EXCISION  X 2   "face"  . CARPAL TUNNEL RELEASE Right   . CATARACT EXTRACTION W/ INTRAOCULAR LENS IMPLANT Bilateral   . CHOLECYSTECTOMY    . ILEOCECETOMY N/A 09/24/2017   Procedure: Pennie Rushing;  Surgeon: Donnie Mesa, MD;  Location: [place];  Service: General;  Laterality: N/A;  . ILEOSTOMY N/A 09/24/2017   Procedure: ILEOSTOMY;  Surgeon: Donnie Mesa, MD;  Location: [place];  Service: General;  Laterality: N/A;  . LAPAROTOMY N/A 09/24/2017   Procedure: EXPLORATORY LAPAROTOMY;  Surgeon: Donnie Mesa, MD;  Location: [place];  Service: General;  Laterality: N/A;  . OMENTECTOMY N/A 09/24/2017   Procedure: OMENTECTOMY;  Surgeon: Donnie Mesa, MD;  Location: [place];  Service: General;  Laterality: N/A;  . SQUAMOUS CELL CARCINOMA EXCISION  X1   "face"  . TONSILLECTOMY       reports that she quit smoking about 50 years ago. Her smoking use included cigarettes. She has a 1.50 pack-year smoking history. she has never used smokeless tobacco. She reports that she does not drink alcohol or use drugs.  Allergies  Allergen Reactions  . Lidocaine     Claims that it made her heart beat go irregular    Family History  Problem Relation Age of Onset  . Bladder Cancer Brother   . Breast cancer Daughter   . Crohn's disease Neg Hx  Prior to Admission medications   Medication Sig Start Date End Date Taking? Authorizing Provider  albuterol (VENTOLIN HFA) 108 (90 Base) MCG/ACT inhaler Inhale 2 puffs into the lungs 4 (four) times daily as needed for wheezing or shortness of breath.    [provider]  carboxymethylcellulose (REFRESH PLUS) 0.5 % SOLN Place 1 drop into both eyes daily as needed (dry eyes).    [provider]  cholestyramine (QUESTRAN) 4 g packet Take 1 packet (4 g total) by mouth 2 (two) times daily. 11/22/17   Ladell Pier, MD  clidinium-chlordiazePOXIDE (LIBRAX) 5-2.5 MG capsule Take 1 capsule daily by mouth.      [provider]  clopidogrel (PLAVIX) 75 MG tablet Take 75 mg by mouth daily. 04/26/17   [provider]  diphenoxylate-atropine (LOMOTIL) 2.5-0.025 MG tablet Take 2 tablets by mouth 4 (four) times daily. 11/08/17   Owens Shark, NP  escitalopram (LEXAPRO) 5 MG tablet TK 1 T PO D 10/17/17   [provider]  folic acid (FOLVITE) 1 MG tablet Take 1 mg by mouth daily.    [provider]  levothyroxine (SYNTHROID, LEVOTHROID) 100 MCG tablet Take 100 mcg by mouth daily before breakfast.    [provider]  loperamide (IMODIUM) 2 MG capsule Take 2 mg by mouth 4 (four) times daily - after meals and at bedtime.    [provider]  magnesium oxide (MAG-OX) 400 (241.3 Mg) MG tablet Take 1 tablet (400 mg total) by mouth 2 (two) times daily. Patient not taking: Reported on 11/29/2017 11/14/17   Ladell Pier, MD  mesalamine (PENTASA) 500 MG CR capsule Take 1,500 mg by mouth 2 (two) times daily.    [provider]  Multiple Vitamin (MULTIVITAMIN WITH MINERALS) TABS tablet Take 1 tablet by mouth daily.    [provider]  ondansetron (ZOFRAN) 4 MG tablet Take 1 tablet (4 mg total) by mouth every 6 (six) hours as needed for nausea. 11/03/17   Hosie Poisson, MD  oxybutynin (DITROPAN) 5 MG tablet Take 5 mg by mouth daily.    [provider]  oxyCODONE-acetaminophen (PERCOCET/ROXICET) 5-325 MG tablet Take 1 tablet every 4 (four) hours as needed by mouth for moderate pain. Patient not taking: Reported on 11/29/2017 09/30/17   Mariel Aloe, MD  pantoprazole (PROTONIX) 40 MG tablet Take 40 mg by mouth every morning. 05/15/17   [provider]  predniSONE (DELTASONE) 5 MG tablet Take 5 mg by mouth 4 (four) times daily.    [provider]  protein supplement shake (PREMIER PROTEIN) LIQD Take 325 mLs (11 oz total) by mouth daily. 11/04/17   Hosie Poisson, MD  psyllium (HYDROCIL/METAMUCIL) 95 % PACK Take 1 packet by mouth 2  (two) times daily. Patient not taking: Reported on 11/29/2017 11/03/17   Hosie Poisson, MD  sodium chloride (OCEAN) 0.65 % SOLN nasal spray Place 2 sprays as needed into both nostrils for congestion.    [provider]  sodium chloride 0.9 % infusion Normal saline at 74ml/hr. 11/03/17   Hosie Poisson, MD    Physical Exam: Vitals:   11/29/17 1849  BP: 126/63  Pulse: 91  Resp: 18  Temp: 99.1 F (37.3 C)  TempSrc: Oral  SpO2: 95%  Weight: 71.7 kg (158 lb 1.6 oz)  Height: 5\' 2"  (1.575 m)      Constitutional: NAD, calm, comfortable Vitals:   11/29/17 1849  BP: 126/63  Pulse: 91  Resp: 18  Temp: 99.1 F (37.3  C)  TempSrc: Oral  SpO2: 95%  Weight: 71.7 kg (158 lb 1.6 oz)  Height: 5\' 2"  (1.575 m)   Eyes: PERRL, lids and conjunctivae normal ENMT: Mucous membranes are dry. Posterior pharynx clear of any exudate or lesions.Normal dentition.  Neck: normal, supple, no masses, no thyromegaly Respiratory: clear to auscultation bilaterally, no wheezing, no crackles. Normal respiratory effort. No accessory muscle use.  Cardiovascular: Regular rate and rhythm, no murmurs / rubs / gallops. No extremity edema. 2+ pedal pulses. No carotid bruits.  Abdomen: no tenderness, no masses palpated. No hepatosplenomegaly. Bowel sounds positive.  Ostomy bag with some output noted Musculoskeletal: no clubbing / cyanosis. No joint deformity upper and lower extremities. Good ROM, no contractures. Normal muscle tone.  Skin: no rashes, lesions, ulcers. No induration Neurologic: CN 2-12 grossly intact. Sensation intact, DTR normal. Strength 5/5 in all 4.  Psychiatric: Normal judgment and insight. Alert and oriented x 3. Normal mood.     Labs on Admission: I have personally reviewed following labs and imaging studies  CBC: Recent Labs  Lab 11/29/17 1139  WBC 14.9*  NEUTROABS 9.8*  HCT 38.3  MCV 85.6  PLT 631   Basic Metabolic Panel: Recent Labs  Lab 11/29/17 1139  NA 133*  K 4.8*    CL 96*  CO2 26  GLUCOSE 122  BUN 18  CALCIUM 9.2  MG 2.3   GFR: Estimated Creatinine Clearance: 33.2 mL/min (A) (by C-G formula based on SCr of 1.17 mg/dL (H)). Liver Function Tests: Recent Labs  Lab 11/29/17 1139  AST 21  ALT 24  ALKPHOS 109  BILITOT 0.7  PROT 6.8  ALBUMIN 2.9*   No results for input(s): LIPASE, AMYLASE in the last 168 hours. No results for input(s): AMMONIA in the last 168 hours. Coagulation Profile: No results for input(s): INR, PROTIME in the last 168 hours. Cardiac Enzymes: No results for input(s): CKTOTAL, CKMB, CKMBINDEX, TROPONINI in the last 168 hours. BNP (last 3 results) No results for input(s): PROBNP in the last 8760 hours. HbA1C: No results for input(s): HGBA1C in the last 72 hours. CBG: No results for input(s): GLUCAP in the last 168 hours. Lipid Profile: No results for input(s): CHOL, HDL, LDLCALC, TRIG, CHOLHDL, LDLDIRECT in the last 72 hours. Thyroid Function Tests: No results for input(s): TSH, T4TOTAL, FREET4, T3FREE, THYROIDAB in the last 72 hours. Anemia Panel: No results for input(s): VITAMINB12, FOLATE, FERRITIN, TIBC, IRON, RETICCTPCT in the last 72 hours. Urine analysis:    Component Value Date/Time   COLORURINE AMBER (A) 10/24/2017 1900   APPEARANCEUR CLOUDY (A) 10/24/2017 1900   LABSPEC 1.013 10/24/2017 1900   PHURINE 5.0 10/24/2017 1900   GLUCOSEU NEGATIVE 10/24/2017 1900   HGBUR NEGATIVE 10/24/2017 1900   BILIRUBINUR NEGATIVE 10/24/2017 1900   KETONESUR NEGATIVE 10/24/2017 1900   PROTEINUR NEGATIVE 10/24/2017 1900   NITRITE NEGATIVE 10/24/2017 1900   LEUKOCYTESUR LARGE (A) 10/24/2017 1900   Sepsis Labs: !!!!!!!!!!!!!!!!!!!!!!!!!!!!!!!!!!!!!!!!!!!! @LABRCNTIP (procalcitonin:4,lacticidven:4) )No results found for this or any previous visit (from the past 240 hour(s)).   Radiological Exams on Admission: Dg Abd 2 Views  Result Date: 11/29/2017 CLINICAL DATA:  Evaluate for obstruction. EXAM: ABDOMEN - 2 VIEW  COMPARISON:  09/23/2017, 09/21/2017.  CT 09/19/2017. FINDINGS: Dilated loops of small bowel are noted. Colon is nondistended. Findings again consistent with small-bowel obstruction. Similar findings noted on prior KUB of 09/23/2017 and CT 09/19/2017. No definite free air noted on today's exam. Degenerative changes scoliosis thoracolumbar spine. Pelvic calcifications consistent phleboliths. IMPRESSION: Findings consistent with  small-bowel obstruction. Electronically Signed   By: Marcello Moores  Register   On: 11/29/2017 11:00    EKG: Independently reviewed.   Assessment/Plan Active Problems:   SBO (small bowel obstruction) (HCC)    Small bowel obstruction Nausea vomiting Metastatic small bowel carcinoma status post omentectomy/ileal cecectomy/end ileostomy, 09/2017 -Admit the patient for further care.  KUB from outpatient noted - General surgery consulted by Dr Learta Codding according to the outpatient notes from today.  - Patient is not actively symptomatic therefore hold off on NG tube.  Maintain n.p.o. status -Antiemetics as needed, D5 half-normal saline at 75 cc/h -Monitor urine output, electrolytes noted -Provide supportive care.  Out of bed to chair and ambulate with assistance whenever possible. -Plans per oncology for chemotherapy  Hypothyroidism -Continue Synthroid 100 mcg orally daily, if unable to tolerate then convert this to IV  GERD -Continue Protonix  Moderate protein calorie malnutrition -Continue protein supplements  Depression -Continue Lexapro  History of TIA -Continue Plavix  History of Crohn's disease -Continue home medications  DVT prophylaxis: Heparin subcu Code Status: DNR Family Communication: None at bedside Disposition Plan: To be determined Consults called: General surgery called by oncology Admission status: Inpatient admission   Twana Wileman Arsenio Loader MD [facility] Pager [phone]  If 7PM-7AM, please contact  night-coverage [email or web address] Password [email or web address]  11/29/2017, 7:00 PM

## 2017-11-29 NOTE — Telephone Encounter (Signed)
Spoke with pt son and notifed them to come in now. Will go to radiology first and then to our [facility] per Dr. Benay Spice and Sandi Mealy. Son voiced understanding. On the way.

## 2017-11-30 ENCOUNTER — Other Ambulatory Visit: Payer: Medicare Other

## 2017-11-30 ENCOUNTER — Ambulatory Visit: Payer: Medicare Other | Admitting: Oncology

## 2017-11-30 ENCOUNTER — Other Ambulatory Visit: Payer: Self-pay

## 2017-11-30 ENCOUNTER — Inpatient Hospital Stay (HOSPITAL_COMMUNITY): Payer: Medicare Other

## 2017-11-30 DIAGNOSIS — E039 Hypothyroidism, unspecified: Secondary | ICD-10-CM

## 2017-11-30 DIAGNOSIS — C172 Malignant neoplasm of ileum: Secondary | ICD-10-CM

## 2017-11-30 DIAGNOSIS — C179 Malignant neoplasm of small intestine, unspecified: Secondary | ICD-10-CM

## 2017-11-30 DIAGNOSIS — K56609 Unspecified intestinal obstruction, unspecified as to partial versus complete obstruction: Secondary | ICD-10-CM

## 2017-11-30 LAB — COMPREHENSIVE METABOLIC PANEL
ALBUMIN: 2.4 g/dL — AB (ref 3.5–5.0)
ALK PHOS: 71 U/L (ref 38–126)
ALT: 20 U/L (ref 14–54)
AST: 20 U/L (ref 15–41)
Anion gap: 6 (ref 5–15)
BILIRUBIN TOTAL: 0.9 mg/dL (ref 0.3–1.2)
BUN: 16 mg/dL (ref 6–20)
CALCIUM: 8 mg/dL — AB (ref 8.9–10.3)
CO2: 26 mmol/L (ref 22–32)
CREATININE: 1.09 mg/dL — AB (ref 0.44–1.00)
Chloride: 100 mmol/L — ABNORMAL LOW (ref 101–111)
GFR calc non Af Amer: 45 mL/min — ABNORMAL LOW (ref >60)
GFR, EST AFRICAN AMERICAN: 52 mL/min — AB (ref >60)
GLUCOSE: 105 mg/dL — AB (ref 65–99)
Potassium: 4 mmol/L (ref 3.5–5.1)
Sodium: 132 mmol/L — ABNORMAL LOW (ref 135–145)
TOTAL PROTEIN: 5.3 g/dL — AB (ref 6.5–8.1)

## 2017-11-30 LAB — CBC
HCT: 31.5 % — ABNORMAL LOW (ref 36.0–46.0)
Hemoglobin: 10.1 g/dL — ABNORMAL LOW (ref 12.0–15.0)
MCH: 27.7 pg (ref 26.0–34.0)
MCHC: 32.1 g/dL (ref 30.0–36.0)
MCV: 86.5 fL (ref 78.0–100.0)
PLATELETS: 254 10*3/uL (ref 150–400)
RBC: 3.64 MIL/uL — ABNORMAL LOW (ref 3.87–5.11)
RDW: 16.6 % — ABNORMAL HIGH (ref 11.5–15.5)
WBC: 12 10*3/uL — ABNORMAL HIGH (ref 4.0–10.5)

## 2017-11-30 LAB — APTT: aPTT: 31 seconds (ref 24–36)

## 2017-11-30 LAB — PROTIME-INR
INR: 1.19
PROTHROMBIN TIME: 15.1 s (ref 11.4–15.2)

## 2017-11-30 LAB — GLUCOSE, CAPILLARY
Glucose-Capillary: 104 mg/dL — ABNORMAL HIGH (ref 65–99)
Glucose-Capillary: 83 mg/dL (ref 65–99)
Glucose-Capillary: 92 mg/dL (ref 65–99)

## 2017-11-30 NOTE — Progress Notes (Signed)
Initial Nutrition Assessment  DOCUMENTATION CODES:   Not applicable  INTERVENTION:    Monitor for diet advancement/toleration  Boost Breeze po BID, each supplement provides 250 kcal and 9 grams of protein once advanced.   NUTRITION DIAGNOSIS:   Inadequate oral intake related to altered GI function as evidenced by per patient/family report.  GOAL:   Patient will meet greater than or equal to 90% of their needs   MONITOR:   PO intake, Supplement acceptance, Weight trends, Labs, Diet advancement  REASON FOR ASSESSMENT:   Malnutrition Screening Tool    ASSESSMENT:   Pt with PMH significant for stage IV small cell carcinoma s/p ileostomy/cecectomy 09/2017, Crohn's disease, and TIA. See by oncology for high ostomy output and was directly admitted for small bowel obstruction treatment.    Spoke with pt at bedside. Reports having stable appetite until two days prior to admission when she began having high ostomy output. States she would eat three meals per day that consisted of high protein items and does not use supplementation at home. Ostomy output has slowed down and pt is eager to eat. Surgery notes pt may be fully decompressed at this time and can possibly be advanced to clear liquid. RD to provide supplementation for extra protein.   Pt was admitted 10/25/17 and showed to be moderately malnourished. Pt weighed 147 lb at this time and now weighs 158 lb, which she confirms. Suspect intake has remained adequate post discharge. Do not suspect malnutrition this admission.   Nutrition-Focused physical exam completed.   Medications reviewed and include: folic acid, SSI, MVI with minerals, prednisone, D5 @ 75 ml/hr Labs reviewed: Na 132 (L) Creatinine 1.09 (H)   NUTRITION - FOCUSED PHYSICAL EXAM:    Most Recent Value  Orbital Region  No depletion  Upper Arm Region  No depletion  Thoracic and Lumbar Region  Unable to assess  Buccal Region  No depletion  Temple Region  No  depletion  Clavicle Bone Region  No depletion  Clavicle and Acromion Bone Region  No depletion  Scapular Bone Region  Unable to assess  Dorsal Hand  No depletion  Patellar Region  No depletion  Anterior Thigh Region  No depletion  Posterior Calf Region  No depletion  Edema (RD Assessment)  None  Hair  Reviewed  Eyes  Reviewed  Mouth  Reviewed  Skin  Reviewed  Nails  Reviewed       Diet Order:  Diet NPO time specified  EDUCATION NEEDS:   Not appropriate for education at this time  Skin:  Skin Assessment: Reviewed RN Assessment  Last BM:  11/30/17  Height:   Ht Readings from Last 1 Encounters:  11/29/17 5\' 2"  (1.575 m)    Weight:   Wt Readings from Last 1 Encounters:  11/29/17 158 lb 1.6 oz (71.7 kg)    Ideal Body Weight:  50 kg  BMI:  Body mass index is 28.92 kg/m.  Estimated Nutritional Needs:   Kcal:  1500-1700 kcal/day  Protein:  70-80 g/day  Fluid:  >1.5 L/day    Mariana Single RD, LDN Clinical Nutrition Pager # - [phone]

## 2017-11-30 NOTE — Progress Notes (Signed)
Patient ID: Terri [name], female   DOB: Aug 28, 1931, 84 y.o.   MRN: [id number]  PROGRESS NOTE    Kallan Bischoff  EVO:[id number] DOB: 05-20-1931 DOA: 11/29/2017 PCP: Prince Solian, MD   Brief Narrative:  84 year old female with history of metastatic small bowel carcinoma status post ileostomy/Cecectomy and omentectomy in 09/2017, hypothyroidism, GERD, Crohn's disease, TIA was directly admitted to the hospital for treatment of small bowel obstruction.  Assessment & Plan:   Active Problems:   SBO (small bowel obstruction) (HCC)   Small bowel obstruction -Improving.  Patient apparently had output from ileostomy this morning -Continue IV fluids.  General surgery consulted.  Diet will be advanced as per general surgery recommendations  Metastatic small bowel carcinoma status post omentectomy/ileal cecectomy/end ileostomy in 09/2017 -Oncology following  Leukocytosis -Probably reactive.  Repeat a.m. labs.  Improving  Hypothyroidism -Continue Synthroid  GERD -Continue Protonix  Moderate protein calorie malnutrition -Continue protein supplements  Depression -Continue Lexapro  History of TIA -Continue Plavix  History of Crohn's disease -Continue home medications   DVT prophylaxis: Heparin Code Status: DNR Family Communication: None at bedside Disposition Plan: Home in 1-2 days  Consultants: Oncology and general surgery  Procedures: None  Antimicrobials: None   Subjective: Patient seen and examined at bedside.  She feels slightly better.  Currently not nauseous or vomiting.  No overnight fever.  Objective: Vitals:   11/29/17 1849 11/29/17 2344 11/30/17 0610  BP: 126/63 (!) 124/53 118/62  Pulse: 91 86 95  Resp: 18 20 20   Temp: 99.1 F (37.3 C) 98.7 F (37.1 C) 98.1 F (36.7 C)  TempSrc: Oral Oral Oral  SpO2: 95% 96% 95%  Weight: 71.7 kg (158 lb 1.6 oz)    Height: 5\' 2"  (1.575 m)      Intake/Output Summary (Last 24 hours) at 11/30/2017  1054 Last data filed at 11/30/2017 1014 Gross per 24 hour  Intake -  Output 650 ml  Net -650 ml   Filed Weights   11/29/17 1849  Weight: 71.7 kg (158 lb 1.6 oz)    Examination:  General exam: Appears calm and comfortable  Respiratory system: Bilateral decreased breath sound at bases Cardiovascular system: S1 & S2 heard, controlled  gastrointestinal system: Abdomen is nondistended, soft and nontender.  Ostomy bag present.  Bowel sounds sluggish Extremities: No cyanosis, clubbing, edema    Data Reviewed: I have personally reviewed following labs and imaging studies  CBC: Recent Labs  Lab 11/29/17 1139 11/30/17 0343  WBC 14.9* 12.0*  NEUTROABS 9.8*  --   HGB  --  10.1*  HCT 38.3 31.5*  MCV 85.6 86.5  PLT 299 299   Basic Metabolic Panel: Recent Labs  Lab 11/29/17 1139 11/30/17 0343  NA 133* 132*  K 4.8* 4.0  CL 96* 100*  CO2 26 26  GLUCOSE 122 105*  BUN 18 16  CREATININE  --  1.09*  CALCIUM 9.2 8.0*  MG 2.3  --    GFR: Estimated Creatinine Clearance: 35.6 mL/min (A) (by C-G formula based on SCr of 1.09 mg/dL (H)). Liver Function Tests: Recent Labs  Lab 11/29/17 1139 11/30/17 0343  AST 21 20  ALT 24 20  ALKPHOS 109 71  BILITOT 0.7 0.9  PROT 6.8 5.3*  ALBUMIN 2.9* 2.4*   No results for input(s): LIPASE, AMYLASE in the last 168 hours. No results for input(s): AMMONIA in the last 168 hours. Coagulation Profile: Recent Labs  Lab 11/30/17 0343  INR 1.19   Cardiac Enzymes: No results for input(s): CKTOTAL,  CKMB, CKMBINDEX, TROPONINI in the last 168 hours. BNP (last 3 results) No results for input(s): PROBNP in the last 8760 hours. HbA1C: No results for input(s): HGBA1C in the last 72 hours. CBG: Recent Labs  Lab 11/29/17 2356 11/30/17 0610  GLUCAP 104* 83   Lipid Profile: No results for input(s): CHOL, HDL, LDLCALC, TRIG, CHOLHDL, LDLDIRECT in the last 72 hours. Thyroid Function Tests: No results for input(s): TSH, T4TOTAL, FREET4, T3FREE,  THYROIDAB in the last 72 hours. Anemia Panel: No results for input(s): VITAMINB12, FOLATE, FERRITIN, TIBC, IRON, RETICCTPCT in the last 72 hours. Sepsis Labs: No results for input(s): PROCALCITON, LATICACIDVEN in the last 168 hours.  No results found for this or any previous visit (from the past 240 hour(s)).       Radiology Studies: Dg Abd 2 Views  Result Date: 11/29/2017 CLINICAL DATA:  Evaluate for obstruction. EXAM: ABDOMEN - 2 VIEW COMPARISON:  09/23/2017, 09/21/2017.  CT 09/19/2017. FINDINGS: Dilated loops of small bowel are noted. Colon is nondistended. Findings again consistent with small-bowel obstruction. Similar findings noted on prior KUB of 09/23/2017 and CT 09/19/2017. No definite free air noted on today's exam. Degenerative changes scoliosis thoracolumbar spine. Pelvic calcifications consistent phleboliths. IMPRESSION: Findings consistent with small-bowel obstruction. Electronically Signed   By: Marcello Moores  Register   On: 11/29/2017 11:00        Scheduled Meds: . cholestyramine  4 g Oral BID  . clidinium-chlordiazePOXIDE  1 capsule Oral Daily  . clopidogrel  75 mg Oral Daily  . diphenoxylate-atropine  2 tablet Oral QID  . escitalopram  5 mg Oral Daily  . folic acid  1 mg Oral Daily  . heparin  5,000 Units Subcutaneous Q8H  . insulin aspart  0-9 Units Subcutaneous TID WC  . levothyroxine  100 mcg Oral QAC breakfast  . loperamide  2 mg Oral TID PC & HS  . mesalamine  1,500 mg Oral BID  . multivitamin with minerals  1 tablet Oral Daily  . oxybutynin  5 mg Oral Daily  . pantoprazole  40 mg Oral q morning - 10a  . predniSONE  15 mg Oral Daily  . protein supplement shake  11 oz Oral Q24H  . psyllium  1 packet Oral BID   Continuous Infusions: . dextrose 5 % and 0.45% NaCl 75 mL/hr at 11/30/17 0934     LOS: 1 day        Aline August, MD [facility] Pager [phone]  If 7PM-7AM, please contact night-coverage [email or web address] Password [facility] 11/30/2017,  10:54 AM

## 2017-11-30 NOTE — Consult Note (Signed)
Reason for Consult:SBO Referring Physician: Cyndi Bender General surgery: Dr. Donnie Mesa PCP:  Prince Solian, MD   Chief complaint:  nausea and vomiting/decreased stoma output.   Terri [name] is an 84 y.o. female.   HPI: Patient called with complaints of nausea and then vomiting.  She also has a decreased output from her ileostomy.  She was seen in the Cancer center and plain films showed dilated loops of small bowel colon was not distended the findings are consistent with a small bowel obstruction he had similar findings on 09/23/17 and 09/19/17.  After evaluation in the cancer center she was subsequently admitted to medicine for further evaluation and treatment of her small bowel obstruction.  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  She had diffuse peritoneal implants noted at the time of expiration.  She has a history of Crohn's disease and on initial presentation in November her symptoms were attributed to Crohn's disease.  She finally underwent exploratory laparotomy with the above-noted findings.  She is currently being seen and followed by Dr. Malachy Mood and he had discussed the use of FOLFOX with the patient.  She was being scheduled for port placement and then follow-up with Dr. Malachy Mood on 11/30/17.  Workup at this point shows she is afebrile vital signs are stable.  Labs show sodium of 132 potassium 4.0 chloride 100 creatinine 1.09.  LFTs are stable WBC is slightly elevated at 12,000.  Hemoglobin hematocrit are stable at 10.1/31.5.  Platelets 254,000.  The only film is the one noted above and done yesterday.  Patient reports that while she was sitting waiting to be admitted her ileostomy started putting out again.  Her son reports she put out 3 bags yesterday. It sounds like she has had 2 more bags since then and currently her bag is full of  feculent very fluid ileostomy drainage.  Very little solid in the bag at all.  Currently her exam is completely benign.  Her son who is a pediatrician; is at the bedside with her.  Past Medical History:  Diagnosis Date  . Age-related macular degeneration, dry, both eyes   . Aortic insufficiency   . Aortic stenosis   . Arthritis    "hands" (05/31/2017)  . Asthma   . Basal cell carcinoma of nose   . Crohn's disease ([place])   . GERD (gastroesophageal reflux disease)   . History of duodenal ulcer   . History of stomach ulcers   . HOH (hard of hearing)   . Hx of rheumatic fever   . Hypertension   . Hypothyroidism   . Positive TB test   . Rheumatic heart disease   . Small bowel obstruction ([place]) 05/31/2017  . Squamous cell cancer of skin of nose   . TIA (transient ischemic attack)   . TIA (transient ischemic attack) 1999?    Past Surgical History:  Procedure Laterality Date  . BASAL CELL CARCINOMA EXCISION  X 2   "face"  . CARPAL TUNNEL RELEASE Right   . CATARACT EXTRACTION W/ INTRAOCULAR LENS IMPLANT Bilateral   . CHOLECYSTECTOMY    . ILEOCECETOMY N/A 09/24/2017   Procedure: Pennie Rushing;  Surgeon: Donnie Mesa, MD;  Location: [facility];  Service: General;  Laterality: N/A;  . ILEOSTOMY N/A 09/24/2017   Procedure: ILEOSTOMY;  Surgeon: Donnie Mesa, MD;  Location: [facility];  Service: General;  Laterality: N/A;  . LAPAROTOMY N/A 09/24/2017  Procedure: EXPLORATORY LAPAROTOMY;  Surgeon: Donnie Mesa, MD;  Location: [facility];  Service: General;  Laterality: N/A;  . OMENTECTOMY N/A 09/24/2017   Procedure: OMENTECTOMY;  Surgeon: Donnie Mesa, MD;  Location: [place];  Service: General;  Laterality: N/A;  . SQUAMOUS CELL CARCINOMA EXCISION  X1   "face"  . TONSILLECTOMY      Family History  Problem Relation Age of Onset  . Bladder Cancer Brother   . Breast cancer Daughter   . Crohn's disease Neg Hx     Social History:  reports that she quit smoking about 50 years ago. Her smoking use  included cigarettes. She has a 1.50 pack-year smoking history. she has never used smokeless tobacco. She reports that she does not drink alcohol or use drugs.  Allergies:  Allergies  Allergen Reactions  . Lidocaine     Claims that it made her heart beat go irregular    Prior to Admission medications   Medication Sig Start Date End Date Taking? Authorizing Provider  albuterol (VENTOLIN HFA) 108 (90 Base) MCG/ACT inhaler Inhale 2 puffs into the lungs 4 (four) times daily as needed for wheezing or shortness of breath.   Yes [provider]  carboxymethylcellulose (REFRESH PLUS) 0.5 % SOLN Place 1 drop into both eyes daily as needed (dry eyes).   Yes [provider]  cholestyramine (QUESTRAN) 4 g packet Take 1 packet (4 g total) by mouth 2 (two) times daily. Patient taking differently: Take 4 g by mouth daily.  11/22/17  Yes Ladell Pier, MD  clidinium-chlordiazePOXIDE (LIBRAX) 5-2.5 MG capsule Take 1 capsule by mouth daily as needed (stomach pain).    Yes [provider]  clopidogrel (PLAVIX) 75 MG tablet Take 75 mg by mouth daily. 04/26/17  Yes [provider]  diphenoxylate-atropine (LOMOTIL) 2.5-0.025 MG tablet Take 2 tablets by mouth 4 (four) times daily. 11/08/17  Yes Owens Shark, NP  escitalopram (LEXAPRO) 5 MG tablet Take 5 mg by mouth every evening 10/17/17  Yes [provider]  folic acid (FOLVITE) 1 MG tablet Take 1 mg by mouth daily.   Yes [provider]  levothyroxine (SYNTHROID, LEVOTHROID) 100 MCG tablet Take 100 mcg by mouth daily before breakfast.   Yes [provider]  loperamide (IMODIUM) 2 MG capsule Take 2 mg by mouth 4 (four) times daily - after meals and at bedtime.   Yes [provider]  mesalamine (PENTASA) 500 MG CR capsule Take 1,500 mg by mouth 2 (two) times daily.   Yes [provider]  Multiple Vitamin (MULTIVITAMIN WITH MINERALS) TABS tablet Take 1 tablet by mouth daily.   Yes  [provider]  ondansetron (ZOFRAN) 4 MG tablet Take 1 tablet (4 mg total) by mouth every 6 (six) hours as needed for nausea. 11/03/17  Yes Hosie Poisson, MD  oxybutynin (DITROPAN) 5 MG tablet Take 5 mg by mouth 2 (two) times daily.    Yes [provider]  oxyCODONE-acetaminophen (PERCOCET/ROXICET) 5-325 MG tablet Take 1 tablet every 4 (four) hours as needed by mouth for moderate pain. 09/30/17  Yes Mariel Aloe, MD  pantoprazole (PROTONIX) 40 MG tablet Take 40 mg by mouth every morning. 05/15/17  Yes [provider]  predniSONE (DELTASONE) 5 MG tablet Take 15 mg by mouth daily after breakfast.    Yes [provider]  sodium chloride (OCEAN) 0.65 % SOLN nasal spray Place 2 sprays as needed into both nostrils for congestion.   Yes [provider]  sodium chloride 0.9 % infusion Normal saline at 52m/hr. Patient taking differently: 500 cc NS, 12 meq Magnesium Sulfate infused over 4 hours. 125 cc/hr 11/03/17  Yes AHosie Poisson MD  magnesium oxide (MAG-OX) 400 (241.3 Mg) MG tablet Take 1 tablet (400 mg total) by mouth 2 (two) times daily. Patient not taking: Reported on 11/29/2017 11/14/17   SLadell Pier MD  protein supplement shake (PREMIER PROTEIN) LIQD Take 325 mLs (11 oz total) by mouth daily. Patient not taking: Reported on 11/29/2017 11/04/17   AHosie Poisson MD  psyllium (HYDROCIL/METAMUCIL) 95 % PACK Take 1 packet by mouth 2 (two) times daily. Patient not taking: Reported on 11/29/2017 11/03/17   AHosie Poisson MD     Results for orders placed or performed during the hospital encounter of 11/29/17 (from the past 48 hour(s))  Glucose, capillary     Status: Abnormal   Collection Time: 11/29/17 11:56 PM  Result Value Ref Range   Glucose-Capillary 104 (H) 65 - 99 mg/dL   Comment 1 Notify RN   Comprehensive metabolic panel     Status: Abnormal   Collection Time: 11/30/17  3:43 AM  Result Value Ref Range   Sodium 132 (L) 135 - 145 mmol/L    Potassium 4.0 3.5 - 5.1 mmol/L   Chloride 100 (L) 101 - 111 mmol/L   CO2 26 22 - 32 mmol/L   Glucose, Bld 105 (H) 65 - 99 mg/dL   BUN 16 6 - 20 mg/dL   Creatinine, Ser 1.09 (H) 0.44 - 1.00 mg/dL   Calcium 8.0 (L) 8.9 - 10.3 mg/dL   Total Protein 5.3 (L) 6.5 - 8.1 g/dL   Albumin 2.4 (L) 3.5 - 5.0 g/dL   AST 20 15 - 41 U/L   ALT 20 14 - 54 U/L   Alkaline Phosphatase 71 38 - 126 U/L   Total Bilirubin 0.9 0.3 - 1.2 mg/dL   GFR calc non Af Amer 45 (L) >60 mL/min   GFR calc Af Amer 52 (L) >60 mL/min    Comment: (NOTE) The eGFR has been calculated using the CKD EPI equation. This calculation has not been validated in all clinical situations. eGFR's persistently <60 mL/min signify possible Chronic Kidney Disease.    Anion gap 6 5 - 15  CBC     Status: Abnormal   Collection Time: 11/30/17  3:43 AM  Result Value Ref Range   WBC 12.0 (H) 4.0 - 10.5 K/uL   RBC 3.64 (L) 3.87 - 5.11 MIL/uL   Hemoglobin 10.1 (L) 12.0 - 15.0 g/dL   HCT 31.5 (L) 36.0 - 46.0 %   MCV 86.5 78.0 - 100.0 fL   MCH 27.7 26.0 - 34.0 pg   MCHC 32.1 30.0 - 36.0 g/dL   RDW 16.6 (H) 11.5 - 15.5 %   Platelets 254 150 - 400 K/uL  Protime-INR     Status: None   Collection Time: 11/30/17  3:43 AM  Result Value Ref Range   Prothrombin Time 15.1 11.4 - 15.2 seconds   INR 1.19   APTT     Status: None   Collection Time: 11/30/17  3:43 AM  Result Value Ref Range   aPTT 31 24 - 36 seconds  Glucose, capillary     Status: None   Collection Time: 11/30/17  6:10 AM  Result Value Ref Range   Glucose-Capillary 83 65 - 99 mg/dL    Dg Abd 2 Views  Result Date: 11/29/2017 CLINICAL DATA:  Evaluate for obstruction. EXAM:  ABDOMEN - 2 VIEW COMPARISON:  09/23/2017, 09/21/2017.  CT 09/19/2017. FINDINGS: Dilated loops of small bowel are noted. Colon is nondistended. Findings again consistent with small-bowel obstruction. Similar findings noted on prior KUB of 09/23/2017 and CT 09/19/2017. No definite free air noted on today's exam.  Degenerative changes scoliosis thoracolumbar spine. Pelvic calcifications consistent phleboliths. IMPRESSION: Findings consistent with small-bowel obstruction. Electronically Signed   By: Marcello Moores  Register   On: 11/29/2017 11:00    Review of Systems  Constitutional: Negative.   HENT: Negative.   Eyes:       She has macular degeneration, legally blind.  Respiratory: Negative.   Cardiovascular: Negative.   Gastrointestinal: Positive for nausea and vomiting.       Distension  Genitourinary: Negative.   Musculoskeletal: Negative.   Neurological: Negative.   Endo/Heme/Allergies: Negative.   Psychiatric/Behavioral: Negative.    Blood pressure 118/62, pulse 95, temperature 98.1 F (36.7 C), temperature source Oral, resp. rate 20, height 5' 2"  (1.575 m), weight 71.7 kg (158 lb 1.6 oz), SpO2 95 %. Physical Exam  Constitutional: She is oriented to person, place, and time. No distress.  Chronically ill 84 year old in no acute distress.  Her ileostomy started working while she was waiting to be admitted to the hospital.  HENT:  Head: Normocephalic and atraumatic.  Mouth/Throat: Oropharynx is clear and moist.  Eyes: Right eye exhibits no discharge. Left eye exhibits no discharge. No scleral icterus.  Neck: Normal range of motion. Neck supple. No JVD present. No tracheal deviation present. No thyromegaly present.  Cardiovascular: Normal rate, regular rhythm and normal heart sounds.  No murmur heard. Respiratory: No respiratory distress. She has no wheezes. She has no rales. She exhibits no tenderness.  GI: Soft. Bowel sounds are normal. She exhibits no distension and no mass. There is no tenderness. There is no rebound and no guarding.  Well-healed midline scar. Ileostomy is working well currently.  Musculoskeletal: She exhibits no edema or tenderness.  Lymphadenopathy:    She has no cervical adenopathy.  Neurological: She is alert and oriented to person, place, and time. No cranial nerve  deficit.  Skin: Skin is warm and dry. No rash noted. She is not diaphoretic. No erythema. No pallor.  Psychiatric: She has a normal mood and affect. Her behavior is normal. Judgment and thought content normal.    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 Donnie Mesa -History of recurrent small bowel obstruction/Crohn's disease/hospitalized 11/4-11/18/18.  - Recurrent small bowel obstruction 11/30/17  Dehydration with mild kidney injury  -Creatinine up to 1.21  Crohn's disease Hx Atrial fibrillation with rapid ventricular rate History of hypertension History of hypothyroidism History of lung cancer and prior lobectomy History of tobacco use Mild AR, trivial PR, mild TR. GERD/history of duodenal ulcers Legally blind/macular degeneration Malnutrition History of TIAs  FEN:IV fluids/NPO HG:DJME DVT:SCD Foley:none Follow-up:Dr. Tsuei  Plan: I am going to repeat her films.  I would imagine they are completely decompressed at this point.  Once this completed we can probably start her back on clear fluids and advance her diet.  Her son has her up walking.  She does not yet have a port for chemotherapy.   Mistina Coatney 11/30/2017, 9:15 AM

## 2017-11-30 NOTE — Progress Notes (Signed)
IP PROGRESS NOTE  Subjective:   Terri [name] was admitted yesterday with clinical and x-ray evidence of a bowel obstruction.  She reports a large volume of output from the ileostomy last night.  No more nausea.  She feels well this morning.  Objective: Vital signs in last 24 hours: Blood pressure 118/62, pulse 95, temperature 98.1 F (36.7 C), temperature source Oral, resp. rate 20, height 5' 2"  (1.575 m), weight 158 lb 1.6 oz (71.7 kg), SpO2 95 %.  Intake/Output from previous day: No intake/output data recorded.  Physical Exam:  HEENT: No thrush Lungs: Clear anteriorly, no respiratory distress Cardiac: Regular rate and rhythm Abdomen: Soft, small volume of liquid stool in the ostomy bag, nontender Extremities: No leg edema   Portacath/PICC-without erythema  Lab Results: Recent Labs    11/29/17 1139 11/30/17 0343  WBC 14.9* 12.0*  HGB  --  10.1*  HCT 38.3 31.5*  PLT 299 254    BMET Recent Labs    11/29/17 1139 11/30/17 0343  NA 133* 132*  K 4.8* 4.0  CL 96* 100*  CO2 26 26  GLUCOSE 122 105*  BUN 18 16  CREATININE  --  1.09*  CALCIUM 9.2 8.0*    Lab Results  Component Value Date   CEA1 6.49 (H) 10/24/2017    Studies/Results: Dg Abd 2 Views  Result Date: 11/30/2017 CLINICAL DATA:  Followup small bowel obstruction. EXAM: ABDOMEN - 2 VIEW COMPARISON:  11/29/2017 FINDINGS: Some radiographic improvement, with less dilated fluid and air-filled loops of small intestine compared yesterday. The examination does remain abnormal. No sign of free air. No abnormal calcifications or acute bone findings. Chronic degenerative changes of the spine. IMPRESSION: Radiographically improved since yesterday, but with continued evidence of partial small bowel obstruction. Electronically Signed   By: Nelson Chimes M.D.   On: 11/30/2017 11:31   Dg Abd 2 Views  Result Date: 11/29/2017 CLINICAL DATA:  Evaluate for obstruction. EXAM: ABDOMEN - 2 VIEW COMPARISON:  09/23/2017,  09/21/2017.  CT 09/19/2017. FINDINGS: Dilated loops of small bowel are noted. Colon is nondistended. Findings again consistent with small-bowel obstruction. Similar findings noted on prior KUB of 09/23/2017 and CT 09/19/2017. No definite free air noted on today's exam. Degenerative changes scoliosis thoracolumbar spine. Pelvic calcifications consistent phleboliths. IMPRESSION: Findings consistent with small-bowel obstruction. Electronically Signed   By: Marcello Moores  Register   On: 11/29/2017 11:00    Medications: I have reviewed the patient's current medications.  Assessment/Plan: 1. 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 pT4, pN1, pM1 ? RAS wild-type, MSI-stable, mutation burden-6, IDH1 and ATM alterations ? CT abdomen/pelvis 09/16/2017-high-grade small bowel obstruction with transition point at the terminal ileum, nodularity of the omentum ? Diffuse peritoneal implants noted at the time of exploratory laparotomy 09/24/2017  2. Crohn's disease 3. Macular degeneration 4. Aortic insufficiency 5. History of a TIA 6. History of a positive PPD 7. History of a DVT following knee meniscus surgery 8. History of "typhoid "fever 9. Hypothyroid 10.Severe dehydration, renal failure? 10/24/2017- normalization of creatinine following intravenous hydration, severe dehydration secondary to a high output ileostomy, improved 11.  Admission with clinical and x-ray evidence of a small bowel obstruction 11/29/2017   Terri [name] appears improved today.  The x-ray shows improvement in the bowel obstruction.  I tried to contact Dr. Georgette Dover yesterday and he is out until 12/03/2017.  I recommend consulting surgery for management of the bowel obstruction. She can  advance her diet as recommended by surgery.  She should continue a regimen for management of the high output ileostomy at discharge.  Outpatient follow-up  will be arranged at the Cancer center for the week of 12/03/2017.  Please call oncology as needed.     LOS: 1 day   Betsy Coder, MD   11/30/2017, 1:50 PM

## 2017-11-30 NOTE — Progress Notes (Signed)
Per [facility] rep pt is active with [facility] for RN/OT/SW. Will need resumption orders at discharge. Marney Doctor RN,BSN,NCM [phone]

## 2017-12-01 ENCOUNTER — Inpatient Hospital Stay (HOSPITAL_COMMUNITY): Payer: Medicare Other

## 2017-12-01 LAB — BASIC METABOLIC PANEL
Anion gap: 7 (ref 5–15)
BUN: 14 mg/dL (ref 6–20)
CO2: 27 mmol/L (ref 22–32)
Calcium: 8 mg/dL — ABNORMAL LOW (ref 8.9–10.3)
Chloride: 96 mmol/L — ABNORMAL LOW (ref 101–111)
Creatinine, Ser: 1.12 mg/dL — ABNORMAL HIGH (ref 0.44–1.00)
GFR calc Af Amer: 51 mL/min — ABNORMAL LOW (ref >60)
GFR, EST NON AFRICAN AMERICAN: 44 mL/min — AB (ref >60)
GLUCOSE: 103 mg/dL — AB (ref 65–99)
POTASSIUM: 4.1 mmol/L (ref 3.5–5.1)
SODIUM: 130 mmol/L — AB (ref 135–145)

## 2017-12-01 LAB — CBC WITH DIFFERENTIAL/PLATELET
BASOS ABS: 0 10*3/uL (ref 0.0–0.1)
Basophils Relative: 0 %
EOS PCT: 2 %
Eosinophils Absolute: 0.2 10*3/uL (ref 0.0–0.7)
HCT: 30.1 % — ABNORMAL LOW (ref 36.0–46.0)
Hemoglobin: 9.7 g/dL — ABNORMAL LOW (ref 12.0–15.0)
LYMPHS PCT: 29 %
Lymphs Abs: 2.9 10*3/uL (ref 0.7–4.0)
MCH: 27.9 pg (ref 26.0–34.0)
MCHC: 32.2 g/dL (ref 30.0–36.0)
MCV: 86.5 fL (ref 78.0–100.0)
Monocytes Absolute: 1.3 10*3/uL — ABNORMAL HIGH (ref 0.1–1.0)
Monocytes Relative: 13 %
Neutro Abs: 5.5 10*3/uL (ref 1.7–7.7)
Neutrophils Relative %: 56 %
PLATELETS: 236 10*3/uL (ref 150–400)
RBC: 3.48 MIL/uL — AB (ref 3.87–5.11)
RDW: 16.3 % — ABNORMAL HIGH (ref 11.5–15.5)
WBC: 9.9 10*3/uL (ref 4.0–10.5)

## 2017-12-01 LAB — MAGNESIUM: MAGNESIUM: 1.6 mg/dL — AB (ref 1.7–2.4)

## 2017-12-01 MED ORDER — HEPARIN SOD (PORK) LOCK FLUSH 100 UNIT/ML IV SOLN
500.0000 [IU] | Freq: Once | INTRAVENOUS | Status: AC
  Administered 2017-12-01: 500 [IU] via INTRAVENOUS
  Filled 2017-12-01: qty 5

## 2017-12-01 MED ORDER — MAGNESIUM SULFATE 2 GM/50ML IV SOLN
2.0000 g | Freq: Once | INTRAVENOUS | Status: AC
  Administered 2017-12-01: 2 g via INTRAVENOUS
  Filled 2017-12-01: qty 50

## 2017-12-01 NOTE — Care Management Note (Signed)
Case Management Note  Patient Details  Name: Terri [name] MRN: [id number] Date of Birth: 08-16-1931                   Subjective: SBO, metastatic small bowel carcinoma  Action/Plan: Please see previous NCM note. Contacted [facility] with resumption of care. Per family, pt has RW and bedside commode at home.   PCP Prince Solian    Expected Discharge Date:  12/01/17               Expected Discharge Plan:  [place]  In-House Referral:  NA  Discharge planning Services  CM Consult  Post Acute Care Choice:  Home Health, Resumption of Svcs/PTA Provider Choice offered to:  Patient  DME Arranged:  N/A DME Agency:  NA  HH Arranged:  RN, OT, Social Work [employer] Agency:  [place]  Status of Service:  Completed, signed off  If discussed at [employer], dates discussed:    Additional Comments:  Erenest Rasher, RN 12/01/2017, 11:14 AM

## 2017-12-01 NOTE — Discharge Summary (Addendum)
Physician Discharge Summary  Terri [name] ZDG:[id number] DOB: 06/02/1931 DOA: 11/29/2017  PCP: Prince Solian, MD  Admit date: 11/29/2017 Discharge date: 12/01/2017  Admitted From: Home Disposition:  Home  Recommendations for Outpatient Follow-up:  1. Follow up with PCP in 1 week with repeat CBC/BMP 2. Follow-up with oncology/Dr. Learta Codding in 1 week 3. Follow-up with general surgery/Dr. 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  General surgery was consulted.  Patient's condition improved.  She started having output from her ileostomy.  She is tolerating diet.  General surgery has cleared the patient for discharge.  Discharge Diagnoses:  Active Problems:   SBO (small bowel obstruction) (HCC)   Small bowel obstruction -General surgery following.  Treated with IV fluids.  Improved.  Patient is having output from ileostomy.  She is tolerating diet.  General surgery has cleared the patient for discharge -Diet as per general surgery -Follow-up with general surgery next week, she has an appointment with Dr. Georgette Dover.  Metastatic small bowel carcinoma status post omentectomy/ileal cecectomy/end ileostomy in 09/2017 -Oncology evaluation appreciated.  Outpatient follow-up with oncology  Leukocytosis -Probably reactive.  Resolved.  Hypothyroidism -Continue Synthroid  GERD -Continue Protonix  Moderate protein calorie malnutrition -Outpatient follow-up  Depression -Continue Lexapro  History of TIA -Continue Plavix  History of Crohn's disease -Continue home  medications  Hypomagnesemia -Being replaced today  Discharge Instructions  Discharge Instructions    Call MD for:  difficulty breathing, headache or visual disturbances   Complete by:  As directed    Call MD for:  extreme fatigue   Complete by:  As directed    Call MD for:  hives   Complete by:  As directed    Call MD for:  persistant dizziness or light-headedness   Complete by:  As directed    Call MD for:  persistant nausea and vomiting   Complete by:  As directed    Call MD for:  redness, tenderness, or signs of infection (pain, swelling, redness, odor or green/yellow discharge around incision site)   Complete by:  As directed    Call MD for:  severe uncontrolled pain   Complete by:  As directed    Call MD for:  temperature >100.4   Complete by:  As directed    Diet - low sodium heart healthy   Complete by:  As directed    Discharge instructions   Complete by:  As directed    Diet as per general surgery recommendations   Increase activity slowly   Complete by:  As directed      Allergies as of 12/01/2017      Reactions   Lidocaine    Claims that it made her heart beat go irregular      Medication List    STOP taking these medications   cholestyramine 4 g packet Commonly known as:  QUESTRAN   diphenoxylate-atropine 2.5-0.025 MG tablet Commonly known as:  LOMOTIL   magnesium oxide 400 (241.3 Mg) MG tablet Commonly known as:  MAG-OX     TAKE these medications   carboxymethylcellulose 0.5 % Soln Commonly known as:  REFRESH PLUS Place 1 drop into both eyes daily as needed (dry  eyes).   clidinium-chlordiazePOXIDE 5-2.5 MG capsule Commonly known as:  LIBRAX Take 1 capsule by mouth daily as needed (stomach pain).   clopidogrel 75 MG tablet Commonly known as:  PLAVIX Take 75 mg by mouth daily.   escitalopram 5 MG tablet Commonly known as:  LEXAPRO Take 5 mg by mouth every evening   folic acid 1 MG tablet Commonly known as:  FOLVITE Take 1 mg by mouth  daily.   levothyroxine 100 MCG tablet Commonly known as:  SYNTHROID, LEVOTHROID Take 100 mcg by mouth daily before breakfast.   loperamide 2 MG capsule Commonly known as:  IMODIUM Take 2 mg by mouth 4 (four) times daily - after meals and at bedtime.   mesalamine 500 MG CR capsule Commonly known as:  PENTASA Take 1,500 mg by mouth 2 (two) times daily.   multivitamin with minerals Tabs tablet Take 1 tablet by mouth daily.   ondansetron 4 MG tablet Commonly known as:  ZOFRAN Take 1 tablet (4 mg total) by mouth every 6 (six) hours as needed for nausea.   oxybutynin 5 MG tablet Commonly known as:  DITROPAN Take 5 mg by mouth 2 (two) times daily.   oxyCODONE-acetaminophen 5-325 MG tablet Commonly known as:  PERCOCET/ROXICET Take 1 tablet every 4 (four) hours as needed by mouth for moderate pain.   pantoprazole 40 MG tablet Commonly known as:  PROTONIX Take 40 mg by mouth every morning.   predniSONE 5 MG tablet Commonly known as:  DELTASONE Take 15 mg by mouth daily after breakfast.   protein supplement shake Liqd Commonly known as:  PREMIER PROTEIN Take 325 mLs (11 oz total) by mouth daily.   psyllium 95 % Pack Commonly known as:  HYDROCIL/METAMUCIL Take 1 packet by mouth 2 (two) times daily.   sodium chloride 0.65 % Soln nasal spray Commonly known as:  OCEAN Place 2 sprays as needed into both nostrils for congestion.   sodium chloride 0.9 % infusion Normal saline at 68ml/hr. What changed:  additional instructions   VENTOLIN HFA 108 (90 Base) MCG/ACT inhaler Generic drug:  albuterol Inhale 2 puffs into the lungs 4 (four) times daily as needed for wheezing or shortness of breath.       Follow-up Information    [name], Ravisankar, MD. Schedule an appointment as soon as possible for a visit in 1 week(s).   Specialty:  Internal Medicine Contact information: [place] [address] [phone]        Donnie Mesa, MD Follow up.   Specialty:   General Surgery Why:  keep scheduled appointment Contact information: [address] [place] [address] [phone]          Allergies  Allergen Reactions  . Lidocaine     Claims that it made her heart beat go irregular    Consultations:  General surgery and oncology   Procedures/Studies: Dg Chest Port 1 View  Result Date: 11/03/2017 CLINICAL DATA:  84 year old female status post PICC placement. EXAM: PORTABLE CHEST 1 VIEW COMPARISON:  Chest radiograph dated 10/24/2017 FINDINGS: A right-sided PICC with tip at the cavoatrial junction noted. The lungs are clear. There is no pleural effusion or pneumothorax. The cardiac silhouette is within normal limits. There is coronary vascular calcification as well as atherosclerotic calcification of the thoracic aorta. No acute osseous pathology. IMPRESSION: 1. Right-sided PICC with tip at the cavoatrial junction. 2. No acute cardiopulmonary process. Electronically Signed   By: Anner Crete M.D.   On: 11/03/2017  17:59   Dg Abd 2 Views  Result Date: 11/30/2017 CLINICAL DATA:  Followup small bowel obstruction. EXAM: ABDOMEN - 2 VIEW COMPARISON:  11/29/2017 FINDINGS: Some radiographic improvement, with less dilated fluid and air-filled loops of small intestine compared yesterday. The examination does remain abnormal. No sign of free air. No abnormal calcifications or acute bone findings. Chronic degenerative changes of the spine. IMPRESSION: Radiographically improved since yesterday, but with continued evidence of partial small bowel obstruction. Electronically Signed   By: Nelson Chimes M.D.   On: 11/30/2017 11:31   Dg Abd 2 Views  Result Date: 11/29/2017 CLINICAL DATA:  Evaluate for obstruction. EXAM: ABDOMEN - 2 VIEW COMPARISON:  09/23/2017, 09/21/2017.  CT 09/19/2017. FINDINGS: Dilated loops of small bowel are noted. Colon is nondistended. Findings again consistent with small-bowel obstruction. Similar findings noted on prior  KUB of 09/23/2017 and CT 09/19/2017. No definite free air noted on today's exam. Degenerative changes scoliosis thoracolumbar spine. Pelvic calcifications consistent phleboliths. IMPRESSION: Findings consistent with small-bowel obstruction. Electronically Signed   By: Marcello Moores  Register   On: 11/29/2017 11:00   Dg Abd Portable 1v  Result Date: 12/01/2017 CLINICAL DATA:  Small bowel obstruction EXAM: PORTABLE ABDOMEN - 1 VIEW COMPARISON:  Yesterday and day prior FINDINGS: Continued gaseous distension of small bowel loops in the left abdomen primarily. Loops are less distended than on admission radiograph. No convincing change from immediate prior. No concerning mass effect or gas collection. IMPRESSION: Partial small bowel obstruction with stable appearance from yesterday. Electronically Signed   By: Monte Fantasia M.D.   On: 12/01/2017 07:21     Subjective: Patient seen and examined at bedside.  She feels much better and wants to go home.  She has tolerated diet.  No overnight fever or vomiting.  Discharge Exam: Vitals:   11/30/17 2042 12/01/17 0425  BP: 124/66 (!) 107/59  Pulse: 93 91  Resp: 16 14  Temp: 98.2 F (36.8 C) 98 F (36.7 C)  SpO2: 96% 98%   Vitals:   11/30/17 0610 11/30/17 1457 11/30/17 2042 12/01/17 0425  BP: 118/62 (!) 148/83 124/66 (!) 107/59  Pulse: 95 100 93 91  Resp: 20 20 16 14   Temp: 98.1 F (36.7 C) 98.3 F (36.8 C) 98.2 F (36.8 C) 98 F (36.7 C)  TempSrc: Oral Oral Oral Oral  SpO2: 95% 100% 96% 98%  Weight:    70.8 kg (156 lb 1.6 oz)  Height:        General: Pt is alert, awake, not in acute distress Cardiovascular: Rate controlled, S1/S2 + Respiratory: Bilateral decreased breath sounds at bases  abdominal: Soft, NT, ND, bowel sounds +, ostomy bag present Extremities: no edema, no cyanosis    The results of significant diagnostics from this hospitalization (including imaging, microbiology, ancillary and laboratory) are listed below for reference.      Microbiology: No results found for this or any previous visit (from the past 240 hour(s)).   Labs: BNP (last 3 results) No results for input(s): BNP in the last 8760 hours. Basic Metabolic Panel: Recent Labs  Lab 11/29/17 1139 11/30/17 0343 12/01/17 0424  NA 133* 132* 130*  K 4.8* 4.0 4.1  CL 96* 100* 96*  CO2 26 26 27   GLUCOSE 122 105* 103*  BUN 18 16 14   CREATININE  --  1.09* 1.12*  CALCIUM 9.2 8.0* 8.0*  MG 2.3  --  1.6*   Liver Function Tests: Recent Labs  Lab 11/29/17 1139 11/30/17 0343  AST 21 20  ALT 24 20  ALKPHOS 109 71  BILITOT 0.7 0.9  PROT 6.8 5.3*  ALBUMIN 2.9* 2.4*   No results for input(s): LIPASE, AMYLASE in the last 168 hours. No results for input(s): AMMONIA in the last 168 hours. CBC: Recent Labs  Lab 11/29/17 1139 11/30/17 0343 12/01/17 0424  WBC 14.9* 12.0* 9.9  NEUTROABS 9.8*  --  5.5  HGB  --  10.1* 9.7*  HCT 38.3 31.5* 30.1*  MCV 85.6 86.5 86.5  PLT 299 254 236   Cardiac Enzymes: No results for input(s): CKTOTAL, CKMB, CKMBINDEX, TROPONINI in the last 168 hours. BNP: Invalid input(s): POCBNP CBG: Recent Labs  Lab 11/29/17 2356 11/30/17 0610 11/30/17 1142  GLUCAP 104* 83 92   D-Dimer No results for input(s): DDIMER in the last 72 hours. Hgb A1c No results for input(s): HGBA1C in the last 72 hours. Lipid Profile No results for input(s): CHOL, HDL, LDLCALC, TRIG, CHOLHDL, LDLDIRECT in the last 72 hours. Thyroid function studies No results for input(s): TSH, T4TOTAL, T3FREE, THYROIDAB in the last 72 hours.  Invalid input(s): FREET3 Anemia work up No results for input(s): VITAMINB12, FOLATE, FERRITIN, TIBC, IRON, RETICCTPCT in the last 72 hours. Urinalysis    Component Value Date/Time   COLORURINE AMBER (A) 10/24/2017 1900   APPEARANCEUR CLOUDY (A) 10/24/2017 1900   LABSPEC 1.013 10/24/2017 1900   PHURINE 5.0 10/24/2017 1900   GLUCOSEU NEGATIVE 10/24/2017 1900   HGBUR NEGATIVE 10/24/2017 1900   BILIRUBINUR  NEGATIVE 10/24/2017 1900   KETONESUR NEGATIVE 10/24/2017 1900   PROTEINUR NEGATIVE 10/24/2017 1900   NITRITE NEGATIVE 10/24/2017 1900   LEUKOCYTESUR LARGE (A) 10/24/2017 1900   Sepsis Labs Invalid input(s): PROCALCITONIN,  WBC,  LACTICIDVEN Microbiology No results found for this or any previous visit (from the past 240 hour(s)).   Time coordinating discharge: 35 minutes  SIGNED:   Aline August, MD  [facility] 12/01/2017, 11:08 AM Pager: [phone]  If 7PM-7AM, please contact night-coverage [email or web address] Password [email or web address]

## 2017-12-01 NOTE — Progress Notes (Signed)
[facility] Office:  [phone] General Surgery Progress Note   LOS: 2 days  POD -     Chief Complaint: Small bowel obstruction  Assessment and Plan: 1.   SBO    Metastatic small bowel carcinoma tumor arising from the terminal ileum,  S/P omentectomy, ileo-colectomy with end ileostomy 09/24/17 - [place]  Small bowel obstruction better.   She is ready to go home. She has an appt to see Dr. Georgette Dover on Monday, 12/03/2017  2.  Crohn's disease 3.  Hx Atrial fibrillation with rapid ventricular rate 4.  History of hypertension 5.  History of hypothyroidism 6.  History of lung cancer and prior lobectomy 7.  History of tobacco use 8.  Legally blind/macular degeneration 9.  History of TIAs  10.  DVT prophylaxis - SQ heparin   Active Problems:   SBO (small bowel obstruction) (HCC)  Subjective:  Better.  Ileostomy working.  Objective:   Vitals:   11/30/17 2042 12/01/17 0425  BP: 124/66 (!) 107/59  Pulse: 93 91  Resp: 16 14  Temp: 98.2 F (36.8 C) 98 F (36.7 C)  SpO2: 96% 98%     Intake/Output from previous day:  01/18 0701 - 01/19 0700 In: 1865 [P.O.:23; I.V.:1830] Out: 2225 [Urine:1075; FIEPP:2951]  Intake/Output this shift:  No intake/output data recorded.   Physical Exam:   General: Older WF who is alert and oriented.    HEENT: Normal. Pupils equal. .   Lungs: Clear   Abdomen: Some palpable masses.  No tenderness.  Has BS.  Ileostomy in RLQ with loose stool.   Lab Results:    Recent Labs    11/30/17 0343 12/01/17 0424  WBC 12.0* 9.9  HGB 10.1* 9.7*  HCT 31.5* 30.1*  PLT 254 236    BMET   Recent Labs    11/30/17 0343 12/01/17 0424  NA 132* 130*  K 4.0 4.1  CL 100* 96*  CO2 26 27  GLUCOSE 105* 103*  BUN 16 14  CREATININE 1.09* 1.12*  CALCIUM 8.0* 8.0*    PT/INR   Recent Labs    11/30/17 0343  LABPROT 15.1  INR 1.19    ABG  No results for input(s): PHART, HCO3 in the last 72 hours.  Invalid input(s): PCO2,  PO2   Studies/Results:  Dg Abd 2 Views  Result Date: 11/30/2017 CLINICAL DATA:  Followup small bowel obstruction. EXAM: ABDOMEN - 2 VIEW COMPARISON:  11/29/2017 FINDINGS: Some radiographic improvement, with less dilated fluid and air-filled loops of small intestine compared yesterday. The examination does remain abnormal. No sign of free air. No abnormal calcifications or acute bone findings. Chronic degenerative changes of the spine. IMPRESSION: Radiographically improved since yesterday, but with continued evidence of partial small bowel obstruction. Electronically Signed   By: Nelson Chimes M.D.   On: 11/30/2017 11:31   Dg Abd 2 Views  Result Date: 11/29/2017 CLINICAL DATA:  Evaluate for obstruction. EXAM: ABDOMEN - 2 VIEW COMPARISON:  09/23/2017, 09/21/2017.  CT 09/19/2017. FINDINGS: Dilated loops of small bowel are noted. Colon is nondistended. Findings again consistent with small-bowel obstruction. Similar findings noted on prior KUB of 09/23/2017 and CT 09/19/2017. No definite free air noted on today's exam. Degenerative changes scoliosis thoracolumbar spine. Pelvic calcifications consistent phleboliths. IMPRESSION: Findings consistent with small-bowel obstruction. Electronically Signed   By: Marcello Moores  Register   On: 11/29/2017 11:00   Dg Abd Portable 1v  Result Date: 12/01/2017 CLINICAL DATA:  Small bowel obstruction EXAM: PORTABLE ABDOMEN - 1 VIEW COMPARISON:  Yesterday and day prior FINDINGS: Continued gaseous distension of small bowel loops in the left abdomen primarily. Loops are less distended than on admission radiograph. No convincing change from immediate prior. No concerning mass effect or gas collection. IMPRESSION: Partial small bowel obstruction with stable appearance from yesterday. Electronically Signed   By: Monte Fantasia M.D.   On: 12/01/2017 07:21     Anti-infectives:   Anti-infectives (From admission, onward)   None      Alphonsa Overall, MD, FACS Pager:  [facility] Office: [phone] 12/01/2017

## 2017-12-03 ENCOUNTER — Inpatient Hospital Stay (HOSPITAL_COMMUNITY): Payer: Medicare Other

## 2017-12-03 ENCOUNTER — Other Ambulatory Visit: Payer: Self-pay | Admitting: Surgery

## 2017-12-03 ENCOUNTER — Encounter (HOSPITAL_COMMUNITY): Payer: Self-pay | Admitting: Radiology

## 2017-12-03 ENCOUNTER — Inpatient Hospital Stay (HOSPITAL_COMMUNITY)
Admission: AD | Admit: 2017-12-03 | Discharge: 2017-12-06 | DRG: 375 | Disposition: A | Discharge status: Hospice | Payer: Medicare Other | Source: Ambulatory Visit | Attending: Surgery | Admitting: Surgery

## 2017-12-03 ENCOUNTER — Ambulatory Visit: Payer: Self-pay | Admitting: Surgery

## 2017-12-03 DIAGNOSIS — H9190 Unspecified hearing loss, unspecified ear: Secondary | ICD-10-CM | POA: Diagnosis present

## 2017-12-03 DIAGNOSIS — C786 Secondary malignant neoplasm of retroperitoneum and peritoneum: Secondary | ICD-10-CM | POA: Diagnosis present

## 2017-12-03 DIAGNOSIS — R112 Nausea with vomiting, unspecified: Secondary | ICD-10-CM

## 2017-12-03 DIAGNOSIS — H353130 Nonexudative age-related macular degeneration, bilateral, stage unspecified: Secondary | ICD-10-CM | POA: Diagnosis present

## 2017-12-03 DIAGNOSIS — Z8711 Personal history of peptic ulcer disease: Secondary | ICD-10-CM

## 2017-12-03 DIAGNOSIS — I352 Nonrheumatic aortic (valve) stenosis with insufficiency: Secondary | ICD-10-CM | POA: Diagnosis present

## 2017-12-03 DIAGNOSIS — Z7189 Other specified counseling: Secondary | ICD-10-CM | POA: Diagnosis not present

## 2017-12-03 DIAGNOSIS — Z66 Do not resuscitate: Secondary | ICD-10-CM | POA: Diagnosis present

## 2017-12-03 DIAGNOSIS — R188 Other ascites: Secondary | ICD-10-CM | POA: Diagnosis present

## 2017-12-03 DIAGNOSIS — Z515 Encounter for palliative care: Secondary | ICD-10-CM

## 2017-12-03 DIAGNOSIS — Z86718 Personal history of other venous thrombosis and embolism: Secondary | ICD-10-CM | POA: Diagnosis not present

## 2017-12-03 DIAGNOSIS — C179 Malignant neoplasm of small intestine, unspecified: Secondary | ICD-10-CM | POA: Diagnosis present

## 2017-12-03 DIAGNOSIS — E860 Dehydration: Secondary | ICD-10-CM | POA: Diagnosis present

## 2017-12-03 DIAGNOSIS — J45909 Unspecified asthma, uncomplicated: Secondary | ICD-10-CM | POA: Diagnosis present

## 2017-12-03 DIAGNOSIS — L8990 Pressure ulcer of unspecified site, unspecified stage: Secondary | ICD-10-CM | POA: Diagnosis present

## 2017-12-03 DIAGNOSIS — K5651 Intestinal adhesions [bands], with partial obstruction: Secondary | ICD-10-CM | POA: Diagnosis present

## 2017-12-03 DIAGNOSIS — K219 Gastro-esophageal reflux disease without esophagitis: Secondary | ICD-10-CM | POA: Diagnosis present

## 2017-12-03 DIAGNOSIS — C172 Malignant neoplasm of ileum: Secondary | ICD-10-CM | POA: Diagnosis not present

## 2017-12-03 DIAGNOSIS — L89151 Pressure ulcer of sacral region, stage 1: Secondary | ICD-10-CM | POA: Diagnosis present

## 2017-12-03 DIAGNOSIS — I099 Rheumatic heart disease, unspecified: Secondary | ICD-10-CM | POA: Diagnosis present

## 2017-12-03 DIAGNOSIS — E039 Hypothyroidism, unspecified: Secondary | ICD-10-CM | POA: Diagnosis present

## 2017-12-03 DIAGNOSIS — Z932 Ileostomy status: Secondary | ICD-10-CM | POA: Diagnosis not present

## 2017-12-03 DIAGNOSIS — K5090 Crohn's disease, unspecified, without complications: Secondary | ICD-10-CM | POA: Diagnosis not present

## 2017-12-03 DIAGNOSIS — Z8673 Personal history of transient ischemic attack (TIA), and cerebral infarction without residual deficits: Secondary | ICD-10-CM | POA: Diagnosis not present

## 2017-12-03 DIAGNOSIS — C772 Secondary and unspecified malignant neoplasm of intra-abdominal lymph nodes: Secondary | ICD-10-CM | POA: Diagnosis not present

## 2017-12-03 DIAGNOSIS — R531 Weakness: Secondary | ICD-10-CM | POA: Diagnosis not present

## 2017-12-03 DIAGNOSIS — I10 Essential (primary) hypertension: Secondary | ICD-10-CM | POA: Diagnosis present

## 2017-12-03 DIAGNOSIS — K56609 Unspecified intestinal obstruction, unspecified as to partial versus complete obstruction: Secondary | ICD-10-CM | POA: Diagnosis not present

## 2017-12-03 LAB — COMPREHENSIVE METABOLIC PANEL
ALBUMIN: 2.8 g/dL — AB (ref 3.5–5.0)
ALT: 14 U/L (ref 14–54)
AST: 19 U/L (ref 15–41)
Alkaline Phosphatase: 71 U/L (ref 38–126)
Anion gap: 11 (ref 5–15)
BILIRUBIN TOTAL: 0.7 mg/dL (ref 0.3–1.2)
BUN: 15 mg/dL (ref 6–20)
CO2: 26 mmol/L (ref 22–32)
Calcium: 8.3 mg/dL — ABNORMAL LOW (ref 8.9–10.3)
Chloride: 97 mmol/L — ABNORMAL LOW (ref 101–111)
Creatinine, Ser: 1.14 mg/dL — ABNORMAL HIGH (ref 0.44–1.00)
GFR calc Af Amer: 50 mL/min — ABNORMAL LOW (ref >60)
GFR calc non Af Amer: 43 mL/min — ABNORMAL LOW (ref >60)
GLUCOSE: 93 mg/dL (ref 65–99)
POTASSIUM: 3.7 mmol/L (ref 3.5–5.1)
Sodium: 134 mmol/L — ABNORMAL LOW (ref 135–145)
TOTAL PROTEIN: 5.9 g/dL — AB (ref 6.5–8.1)

## 2017-12-03 LAB — CBC
HEMATOCRIT: 33.3 % — AB (ref 36.0–46.0)
Hemoglobin: 10.6 g/dL — ABNORMAL LOW (ref 12.0–15.0)
MCH: 27.7 pg (ref 26.0–34.0)
MCHC: 31.8 g/dL (ref 30.0–36.0)
MCV: 86.9 fL (ref 78.0–100.0)
PLATELETS: 291 10*3/uL (ref 150–400)
RBC: 3.83 MIL/uL — ABNORMAL LOW (ref 3.87–5.11)
RDW: 16.3 % — AB (ref 11.5–15.5)
WBC: 11.3 10*3/uL — AB (ref 4.0–10.5)

## 2017-12-03 LAB — PHOSPHORUS: Phosphorus: 3.8 mg/dL (ref 2.5–4.6)

## 2017-12-03 LAB — MAGNESIUM: MAGNESIUM: 2.1 mg/dL (ref 1.7–2.4)

## 2017-12-03 MED ORDER — LEVOTHYROXINE SODIUM 100 MCG PO TABS: 100.0000 ug | ORAL_TABLET | Freq: Every day | ORAL | Status: DC

## 2017-12-03 MED ORDER — DIPHENHYDRAMINE HCL 12.5 MG/5ML PO ELIX: 12.5000 mg | ORAL_SOLUTION | Freq: Four times a day (QID) | ORAL | Status: DC | PRN

## 2017-12-03 MED ORDER — DIPHENHYDRAMINE HCL 50 MG/ML IJ SOLN: 12.5000 mg | Freq: Four times a day (QID) | INTRAMUSCULAR | Status: DC | PRN

## 2017-12-03 MED ORDER — LEVOTHYROXINE SODIUM 100 MCG PO TABS
100.0000 ug | ORAL_TABLET | Freq: Every day | ORAL | Status: DC
  Administered 2017-12-04 – 2017-12-06 (×3): 100 ug via ORAL
  Filled 2017-12-03 (×3): qty 1

## 2017-12-03 MED ORDER — POTASSIUM CHLORIDE IN NACL 20-0.9 MEQ/L-% IV SOLN
INTRAVENOUS | Status: DC
  Administered 2017-12-03 – 2017-12-05 (×4): via INTRAVENOUS
  Administered 2017-12-05: 1 mL via INTRAVENOUS
  Administered 2017-12-05: 10:00:00 via INTRAVENOUS
  Administered 2017-12-06: 1 mL via INTRAVENOUS
  Filled 2017-12-03 (×7): qty 1000

## 2017-12-03 MED ORDER — ENOXAPARIN SODIUM 40 MG/0.4ML ~~LOC~~ SOLN
40.0000 mg | SUBCUTANEOUS | Status: DC
  Administered 2017-12-03 – 2017-12-05 (×3): 40 mg via SUBCUTANEOUS
  Filled 2017-12-03 (×3): qty 0.4

## 2017-12-03 MED ORDER — SODIUM CHLORIDE 0.9% FLUSH
10.0000 mL | INTRAVENOUS | Status: DC | PRN
  Administered 2017-12-06: 10 mL
  Filled 2017-12-03: qty 40

## 2017-12-03 MED ORDER — POLYVINYL ALCOHOL 1.4 % OP SOLN
1.0000 [drp] | Freq: Every day | OPHTHALMIC | Status: DC | PRN
  Filled 2017-12-03: qty 15

## 2017-12-03 MED ORDER — ONDANSETRON 4 MG PO TBDP: 4.0000 mg | ORAL_TABLET | Freq: Four times a day (QID) | ORAL | Status: DC | PRN

## 2017-12-03 MED ORDER — ONDANSETRON HCL 4 MG/2ML IJ SOLN: 4.0000 mg | Freq: Four times a day (QID) | INTRAMUSCULAR | Status: DC | PRN

## 2017-12-03 MED ORDER — PREDNISONE 20 MG PO TABS
20.0000 mg | ORAL_TABLET | Freq: Every day | ORAL | Status: DC
  Administered 2017-12-04: 20 mg via ORAL
  Filled 2017-12-03: qty 1

## 2017-12-03 MED ORDER — MORPHINE SULFATE (PF) 4 MG/ML IV SOLN: 2.0000 mg | INTRAVENOUS | Status: DC | PRN

## 2017-12-03 MED ORDER — PREDNISONE 20 MG PO TABS
20.0000 mg | ORAL_TABLET | Freq: Every day | ORAL | Status: DC
  Administered 2017-12-03: 20 mg via ORAL
  Filled 2017-12-03: qty 1

## 2017-12-03 MED ORDER — IOPAMIDOL (ISOVUE-300) INJECTION 61%
INTRAVENOUS | Status: AC
  Administered 2017-12-03: 100 mL
  Filled 2017-12-03: qty 100

## 2017-12-03 MED ORDER — ESCITALOPRAM OXALATE 10 MG PO TABS
5.0000 mg | ORAL_TABLET | Freq: Every day | ORAL | Status: DC
  Administered 2017-12-03 – 2017-12-05 (×3): 5 mg via ORAL
  Filled 2017-12-03 (×3): qty 1

## 2017-12-03 MED ORDER — MESALAMINE ER 250 MG PO CPCR
1500.0000 mg | ORAL_CAPSULE | Freq: Two times a day (BID) | ORAL | Status: DC
  Administered 2017-12-03 – 2017-12-06 (×6): 1500 mg via ORAL
  Filled 2017-12-03 (×6): qty 6

## 2017-12-03 MED ORDER — PANTOPRAZOLE SODIUM 40 MG PO TBEC
40.0000 mg | DELAYED_RELEASE_TABLET | ORAL | Status: DC
  Administered 2017-12-04 – 2017-12-06 (×3): 40 mg via ORAL
  Filled 2017-12-03 (×3): qty 1

## 2017-12-03 MED ORDER — OXYBUTYNIN CHLORIDE 5 MG PO TABS
5.0000 mg | ORAL_TABLET | Freq: Two times a day (BID) | ORAL | Status: DC
  Administered 2017-12-03 – 2017-12-06 (×6): 5 mg via ORAL
  Filled 2017-12-03 (×6): qty 1

## 2017-12-03 MED ORDER — ALBUTEROL SULFATE (2.5 MG/3ML) 0.083% IN NEBU: 3.0000 mL | INHALATION_SOLUTION | Freq: Four times a day (QID) | RESPIRATORY_TRACT | Status: DC | PRN

## 2017-12-03 NOTE — H&P (Signed)
History of Present Illness Terri [name]. Terri Bradt MD; 12/03/2017 12:29 PM) The patient is a 84 year old female presenting for a post-operative visit. This is an 84 year old female who underwent exploratory laparotomy on 09/24/17 for presumed Crohn's disease with stricture. 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 She had widespread peritoneal carcinomatosis. She was discharged home on 09/30/17. She saw Dr. Benay Spice and they are considering a course of chemotherapy. Home health is helping her with her ileostomy appliance. She is now using a belt.   Initially, she had a lot of issues with high output from her ileostomy resulting in volume depletion and dehydration. She has a PICC line and the family and home health nursing are administering intravenous fluids at home. She has been using Metamucil as well as Questran to try to decrease her ileostomy output. She has not started any chemotherapy. However over the last couple of weeks she has had a couple of hospital admissions for what appears to be small bowel obstruction. She was most recently discharged 2 days ago because of abdominal distention, decreased ileostomy output, nausea and vomiting. Her ileostomy began having increased output was she was hospitalized and she was discharged home. However she continues to be nauseated and has been able to take very little by mouth. She has very little ileostomy output today. She has not had a CT scan since prior to surgery in November.  She is accompanied by her son who is a physician as well as daughter-in-law.   Problem List/Past Medical Terri Key K. Aretta Stetzel, MD; 12/03/2017 12:34 PM) Terri [name] FOR REMOVAL OF STAPLES (Z48.02) SMALL BOWEL CARCINOMA (C17.9) SMALL BOWEL OBSTRUCTION (K56.609)  Past Surgical History (Terri [name], [place]; 12/03/2017 9:56 AM) Resection of Small  Bowel  Allergies (Terri [name], [place]; 12/03/2017 9:57 AM) Lidocaine *CHEMICALS* Allergies Reconciled  Medication History (Terri [name], [place]; 12/03/2017 9:58 AM) Cholestyramine (4GM Packet, Oral) Active. Sodium Chloride (0.9% Solution, Intravenous) Active. MAGnesium-Oxide (400 (241.3 Mg)MG Tablet, Oral) Active. Synthroid (100MCG Tablet, Oral) Active. Clopidogrel Bisulfate (75MG  Tablet, Oral) Active. Losartan Potassium (50MG  Tablet, Oral) Active. Pantoprazole Sodium (40MG  Tablet DR, Oral) Active. Medications Reconciled  Social History (Terri [name], [place]; 12/03/2017 9:56 AM) Alcohol use Occasional alcohol use. Caffeine use Coffee, Tea. No drug use Tobacco use Former smoker.  Family History (Terri [name], [place]; 12/03/2017 9:56 AM) Breast Cancer Daughter. Depression Father.  Other Problems Terri [name]. [name], MD; 12/03/2017 12:34 PM) Arthritis Bladder Problems Thyroid Disease     Review of Systems (Terri A. Brown RMA; 12/03/2017 9:56 AM) General Present- Fatigue. Not Present- Appetite Loss, Chills, Fever, Night Sweats, Weight Gain and Weight Loss. Skin Present- Rash. Not Present- Change in Wart/Mole, Dryness, Hives, Jaundice, New Lesions, Non-Healing Wounds and Ulcer. HEENT Present- Hearing Loss. Not Present- Earache, Hoarseness, Nose Bleed, Oral Ulcers, Ringing in the Ears, Seasonal Allergies, Sinus Pain, Sore Throat, Visual Disturbances, Wears glasses/contact lenses and Yellow Eyes. Respiratory Not Present- Bloody sputum, Chronic Cough, Difficulty Breathing, Snoring and Wheezing. Female Genitourinary Not Present- Frequency, Nocturia, Painful Urination, Pelvic Pain and Urgency. Psychiatric Present- Anxiety and Depression. Not Present- Bipolar, Change in Sleep Pattern, Fearful and Frequent crying.  Vitals (Terri A. Brown RMA; 12/03/2017 9:57 AM) 12/03/2017 9:57 AM Weight: 150 lb Height: 64in Body Surface Area: 1.73 m Body Mass  Index: 25.75 kg/m  Temp.: 98.60F  Pulse: 107 (Regular)  BP: 120/84 (Sitting, Left Arm, Standard)      Physical Exam (  Terri [name]. Caron Ode MD; 12/03/2017 12:30 PM)  The physical exam findings are as follows: Note:WDWN in NAD Eyes: Pupils equal, round; sclera anicteric HENT: Oral mucosa moist; good dentition Neck: No masses palpated, no thyromegaly Lungs: CTA bilaterally; normal respiratory effort CV: Regular rate and rhythm; no murmurs; extremities well-perfused with no edema Abd: +bowel sounds, mildly distended; well-healed midline incision RLQ ileostomy - pink, viable; thick pasty output in bag Skin: Warm, dry; no sign of jaundice Psychiatric - alert and oriented x 4; calm mood and affect    Assessment & Plan Terri Key K. Myalee Stengel MD; 12/03/2017 12:34 PM)  SMALL BOWEL CARCINOMA (C17.9)   SMALL BOWEL OBSTRUCTION (K56.609)  Note:The patient seems to have developed at least in intermittent partial small bowel obstruction only 2 months after surgery. It is unclear whether this is an early obstruction from surgical adhesions or if this represents progression of her metastatic adenocarcinoma. The patient has been too weak to initiate chemotherapy. She has been receiving supplemental IV hydration through her PICC line. Her oral intake is fairly poor.  The patient is very astute and clear minded. She realizes that this may represent progression of her metastatic disease. She is not eager to pursue aggressive treatment if treatment is likely to be futile. She initiated very frank discussion about palliative care and hospice. I recommended that we obtain a CT scan to see if there is been any significant progression of disease versus SBO from adhesions. We will also rehydrate her as an inpatient. If it appears that she has a malignant obstruction, we can consider a palliative gastrostomy tube to help alleviate her nausea and vomiting.   Direct admit for IV  hydration/ CT scan   Terri [name]. Georgette Dover, MD, [facility]  General/ Trauma Surgery  12/03/2017 1:27 PM

## 2017-12-03 NOTE — Progress Notes (Signed)
Pt arrived to unit from MD's office through Admissions, via wheelchair.  Pt had PICC line Placed from [facility], and ileostomy from prior admission that bag was just changed by Pt's daughter this AM prior to MD's appointment.  Pt was on fluids that were started by [place] - which were stopped, on arrival to unit. IV team called to evaluate the line and Wound nurse for ostomy.

## 2017-12-03 NOTE — Consult Note (Signed)
[place] Nurse ostomy follow up Stoma type/location: RLQ, end ileostomy Stomal assessment/size: pouch intact, per patient her daughter replaced just prior to her arrival to the hospital  Peristomal assessment: NA Treatment options for stomal/peristomal skin: NA Output liquid yellow  Ostomy pouching: 1pc.flat in place from home Patient reports they have been using 1pc flat with tincture of Benzoin which has aided them to get about 2 days of wear time currently. She reports her skin is not broken. She reports that the "other pouches went on backorder".  She was using a belt and flexible convex when she was last seen by one of my partners in December.    Education provided: We discussed pouching options I will follow along with you to assist with pouching and education needs.  Enrolled patient in [place] Start Discharge program: Yes   [place] Nurse will follow along with you for continued support with ostomy teaching and care [place] MSN, [place], [place], [place], [place]

## 2017-12-03 NOTE — Progress Notes (Signed)
Started PO contrast.

## 2017-12-03 NOTE — H&P (Signed)
History of Present Illness Terri [name]. Rita Vialpando MD; 12/03/2017 12:29 PM) The patient is a 84 year old female presenting for a post-operative visit. This is an 84 year old female who underwent exploratory laparotomy on 09/24/17 for presumed Crohn's disease with stricture. 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 She had widespread peritoneal carcinomatosis. She was discharged home on 09/30/17. She saw Dr. Benay Spice and they are considering a course of chemotherapy. Home health is helping her with her ileostomy appliance. She is now using a belt.   Initially, she had a lot of issues with high output from her ileostomy resulting in volume depletion and dehydration. She has a PICC line and the family and home health nursing are administering intravenous fluids at home. She has been using Metamucil as well as Questran to try to decrease her ileostomy output. She has not started any chemotherapy. However over the last couple of weeks she has had a couple of hospital admissions for what appears to be small bowel obstruction. She was most recently discharged 2 days ago because of abdominal distention, decreased ileostomy output, nausea and vomiting. Her ileostomy began having increased output was she was hospitalized and she was discharged home. However she continues to be nauseated and has been able to take very little by mouth. She has very little ileostomy output today. She has not had a CT scan since prior to surgery in November.  She is accompanied by her son who is a physician as well as daughter-in-law.   Problem List/Past Medical Rodman Key K. Orlando Thalmann, MD; 12/03/2017 12:34 PM) [name]  Past Surgical History (Tanisha A. Owens Shark, [place]; 12/03/2017 9:56 AM) Resection of Small  Bowel  Allergies (Tanisha A. Owens Shark, [place]; 12/03/2017 9:57 AM) Lidocaine *CHEMICALS* Allergies Reconciled  Medication History (Tanisha A. Owens Shark, [place]; 12/03/2017 9:58 AM) Cholestyramine (4GM Packet, Oral) Active. Sodium Chloride (0.9% Solution, Intravenous) Active. MAGnesium-Oxide (400 (241.3 Mg)MG Tablet, Oral) Active. Synthroid (100MCG Tablet, Oral) Active. Clopidogrel Bisulfate (75MG  Tablet, Oral) Active. Losartan Potassium (50MG  Tablet, Oral) Active. Pantoprazole Sodium (40MG  Tablet DR, Oral) Active. Medications Reconciled  Social History (Tanisha A. Owens Shark, [place]; 12/03/2017 9:56 AM) Alcohol use Occasional alcohol use. Caffeine use Coffee, Tea. No drug use Tobacco use Former smoker.  Family History (Tanisha A. Owens Shark, [place]; 12/03/2017 9:56 AM) Breast Cancer Daughter. Depression Father.  Other Problems Terri [name]. Alyna Stensland, MD; 12/03/2017 12:34 PM) Arthritis Bladder Problems Thyroid Disease     Review of Systems (Tanisha A. Brown RMA; 12/03/2017 9:56 AM) General Present- Fatigue. Not Present- Appetite Loss, Chills, Fever, Night Sweats, Weight Gain and Weight Loss. Skin Present- Rash. Not Present- Change in Wart/Mole, Dryness, Hives, Jaundice, New Lesions, Non-Healing Wounds and Ulcer. HEENT Present- Hearing Loss. Not Present- Earache, Hoarseness, Nose Bleed, Oral Ulcers, Ringing in the Ears, Seasonal Allergies, Sinus Pain, Sore Throat, Visual Disturbances, Wears glasses/contact lenses and Yellow Eyes. Respiratory Not Present- Bloody sputum, Chronic Cough, Difficulty Breathing, Snoring and Wheezing. Female Genitourinary Not Present- Frequency, Nocturia, Painful Urination, Pelvic Pain and Urgency. Psychiatric Present- Anxiety and Depression. Not Present- Bipolar, Change in Sleep Pattern, Fearful and Frequent crying.  Vitals (Tanisha A. Brown RMA; 12/03/2017 9:57 AM) 12/03/2017 9:57 AM Weight: 150 lb Height: 64in Body Surface Area: 1.73 m Body Mass Index: 25.75  kg/m  Temp.: 98.76F  Pulse: 107 (Regular)  BP: 120/84 (Sitting, Left Arm, Standard)      Physical Exam (  Terri [name]. Dantrell Schertzer MD; 12/03/2017 12:30 PM)  The physical exam findings are as follows: Note:WDWN in NAD Eyes: Pupils equal, round; sclera anicteric HENT: Oral mucosa moist; good dentition Neck: No masses palpated, no thyromegaly Lungs: CTA bilaterally; normal respiratory effort CV: Regular rate and rhythm; no murmurs; extremities well-perfused with no edema Abd: +bowel sounds, mildly distended; well-healed midline incision RLQ ileostomy - pink, viable; thick pasty output in bag Skin: Warm, dry; no sign of jaundice Psychiatric - alert and oriented x 4; calm mood and affect    Assessment & Plan Rodman Key K. Anzlee Hinesley MD; 12/03/2017 12:34 PM)  SMALL BOWEL CARCINOMA (C17.9)   SMALL BOWEL OBSTRUCTION (K56.609)  Note:The patient seems to have developed at least in intermittent partial small bowel obstruction only 2 months after surgery. It is unclear whether this is an early obstruction from surgical adhesions or if this represents progression of her metastatic adenocarcinoma. The patient has been too weak to initiate chemotherapy. She has been receiving supplemental IV hydration through her PICC line. Her oral intake is fairly poor.  The patient is very astute and clear minded. She realizes that this may represent progression of her metastatic disease. She is not eager to pursue aggressive treatment if treatment is likely to be futile. She initiated very frank discussion about palliative care and hospice. I recommended that we obtain a CT scan to see if there is been any significant progression of disease versus SBO from adhesions. We will also rehydrate her as an inpatient. If it appears that she has a malignant obstruction, we can consider a palliative gastrostomy tube to help alleviate her nausea and vomiting.   Direct admit for IV hydration/ CT scan  Terri [name].  Georgette Dover, MD, [facility]  12/03/2017 12:35 PM

## 2017-12-04 ENCOUNTER — Other Ambulatory Visit: Payer: Self-pay

## 2017-12-04 DIAGNOSIS — Z515 Encounter for palliative care: Secondary | ICD-10-CM

## 2017-12-04 DIAGNOSIS — C179 Malignant neoplasm of small intestine, unspecified: Principal | ICD-10-CM

## 2017-12-04 DIAGNOSIS — Z66 Do not resuscitate: Secondary | ICD-10-CM

## 2017-12-04 LAB — CBC
HCT: 32.6 % — ABNORMAL LOW (ref 36.0–46.0)
Hemoglobin: 10.3 g/dL — ABNORMAL LOW (ref 12.0–15.0)
MCH: 27 pg (ref 26.0–34.0)
MCHC: 31.6 g/dL (ref 30.0–36.0)
MCV: 85.6 fL (ref 78.0–100.0)
PLATELETS: 286 10*3/uL (ref 150–400)
RBC: 3.81 MIL/uL — ABNORMAL LOW (ref 3.87–5.11)
RDW: 16.1 % — ABNORMAL HIGH (ref 11.5–15.5)
WBC: 9.1 10*3/uL (ref 4.0–10.5)

## 2017-12-04 LAB — BASIC METABOLIC PANEL
Anion gap: 12 (ref 5–15)
BUN: 13 mg/dL (ref 6–20)
CHLORIDE: 100 mmol/L — AB (ref 101–111)
CO2: 22 mmol/L (ref 22–32)
CREATININE: 1 mg/dL (ref 0.44–1.00)
Calcium: 8.2 mg/dL — ABNORMAL LOW (ref 8.9–10.3)
GFR calc Af Amer: 58 mL/min — ABNORMAL LOW (ref >60)
GFR calc non Af Amer: 50 mL/min — ABNORMAL LOW (ref >60)
GLUCOSE: 84 mg/dL (ref 65–99)
Potassium: 4.7 mmol/L (ref 3.5–5.1)
SODIUM: 134 mmol/L — AB (ref 135–145)

## 2017-12-04 MED ORDER — PREDNISONE 5 MG PO TABS
15.0000 mg | ORAL_TABLET | Freq: Every day | ORAL | Status: DC
  Administered 2017-12-05 – 2017-12-06 (×2): 15 mg via ORAL
  Filled 2017-12-04 (×2): qty 1

## 2017-12-04 NOTE — Progress Notes (Signed)
Brief Nutrition Note:   Pt screened for MST. Case discussed with RN who reports advancement to full liquid diet for dinner. Confirms upcoming oncology and palliative care consults.   Spoke with patient who reports great appetite, however has intermittent episodes of N/V related to obstructions. Pt is doubtful that she will ever be able to consume solid food again. She is eager to try full liquid diet. Obtained food preferences and entered into ordering system.   Pt reports that she has spoken with palliative care NP. She verbalizes intention to transition to a more comfort-based approach.   No further nutrition interventions warranted at this time. Please consult RD if future nutrition needs arise.   Terri [name], [place] [facility] Intern [phone]

## 2017-12-04 NOTE — Consult Note (Signed)
Consultation Note Date: 12/04/2017   Patient Name: Terri [name]  DOB: Apr 02, 1931  MRN: [id number]  Age / Sex: 84 y.o., female  PCP: Prince Solian, MD Referring Physician: Donnie Mesa, MD  Reason for Consultation: Establishing goals of care and Psychosocial/spiritual support  HPI/Patient Profile: 84 y.o. female  admitted on 12/03/2017, a post surgical patient of Dr Georgette Dover who underwent exploratory laparotomy on 09/24/17 for presumed Crohn's disease with stricture.   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   She had widespread peritoneal carcinomatosis. She was discharged home on 09/30/17. She saw Dr. Benay Spice and was open to chemotherapy.   Initially, she had a lot of issues with high output from her ileostomy resulting in volume depletion and dehydration.   She has a PICC line and the family and home health nursing are administering intravenous fluids at home. She has been using Metamucil as well as Questran to try to decrease her ileostomy output.  As a result she has not started any chemotherapy.   She has had multiple  hospital admissions for what appears to be small bowel obstruction.   She was most recently discharged 2 days ago because of abdominal distention, decreased ileostomy output, nausea and vomiting. Her ileostomy began having increased output and she continues to be nauseated   CT scan suggests progression of disease.  Patient faces treatment option decisions, advanced directive decisions, EOL wishes, and anticipatory care needs.   Clinical Assessment and Goals of Care:   This NP Wadie Lessen reviewed medical records, received report from team, assessed the patient and then meet at the patient's bedside along with her daughter in law  to discuss  diagnosis, prognosis, GOC, EOL wishes disposition and options.  Concept of Hospice and Palliative Care were discussed  A detailed discussion was had today regarding advanced directives.  Concepts specific to code status, artifical feeding and hydration, continued IV antibiotics and rehospitalization was had.  The difference between a aggressive medical intervention path  and a palliative comfort care path for this patient at this time was had.  Values and goals of care important to patient and family were attempted to be elicited.  Patient was transparent and pragmatic in her conversation regarding her current  medicial situation.  She understands the long term poor prognosis and just wants "all the information" before making any medical decisions.   We discussed in detail the likely expectations and trajectory of this disease, and the importance of continued conversation with her family and providers to ensure the plan is within the patient's GOCs     We discussed hospice benefit specific for this patient.  Natural trajectory and expectations at EOL were discussed.  Questions and concerns addressed.   Patient  encouraged to call with questions or concerns.    PMT will continue to support holistically.  This NP will f/u in the morning after patient has had the opportunity to talk again with her physicians.  PATIENT make her own healthcare decisions with support of her family    SUMMARY OF RECOMMENDATIONS    Code Status/Advance Care Planning:  DNR   Palliative Prophylaxis:   Aspiration, Bowel Regimen, Delirium Protocol, Frequent Pain Assessment and Oral Care  Additional Recommendations (Limitations, Scope, Preferences):  Patient's main focus is quality of life  Patient wishes to speak with both Dr Prince Solian and Dr Benay Spice to explore viable treatment options for goal of prolonged quality of life  She clearly verbalizes that if "there is nothing left to do I'm ready to die"    "now  tell me what that will look like"  Psycho-social/Spiritual:   Desire for further Chaplaincy support:denied - patient has [place] support  Additional Recommendations: Education on Hospice  Prognosis:   Poor prognosis- clearly hospice eligible  Prognosis will depend on desire for life prolonging measures  (ie artificial hydration )  Discharge Planning: To Be Determined     Primary Diagnoses: Present on Admission: . Small bowel cancer ([place]) . Pressure injury of skin   I have reviewed the medical record, interviewed the patient and family, and examined the patient. The following aspects are pertinent.  Past Medical History:  Diagnosis Date  . Age-related macular degeneration, dry, both eyes   . Aortic insufficiency   . Aortic stenosis   . Arthritis    "hands" (05/31/2017)  . Asthma   . Basal cell carcinoma of nose   . Crohn's disease ([place])   . GERD (gastroesophageal reflux disease)   . History of duodenal ulcer   . History of stomach ulcers   . HOH (hard of hearing)   . Hx of rheumatic fever   . Hypertension   . Hypothyroidism   . Positive TB test   . Rheumatic heart disease   . Small bowel obstruction ([place]) 05/31/2017  . Squamous cell cancer of skin of nose   . TIA (transient ischemic attack)   . TIA (transient ischemic attack) 1999?   Social History   Socioeconomic History  . Marital status: Widowed    Spouse name: None  . Number of children: None  . Years of education: None  . Highest education level: None  Social Needs  . Financial resource strain: None  . Food insecurity - worry: None  . Food insecurity - inability: None  . Transportation needs - medical: None  . Transportation needs - non-medical: None  Occupational History  . None  Tobacco Use  . Smoking status: Former Smoker    Packs/day: 0.10    Years: 15.00    Pack years: 1.50    Types: Cigarettes    Last attempt to quit: 1969    Years since quitting: 50.0  . Smokeless  tobacco: Never Used  Substance and Sexual Activity  . Alcohol use: No  . Drug use: No  . Sexual activity: Not Currently  Other Topics Concern  . None  Social History Narrative   Widowed 12/2016. Living with son. Retired Therapist, sports    Family History  Problem Relation Age of Onset  . Bladder Cancer Brother   . Breast cancer Daughter   . Crohn's disease Neg Hx    Scheduled Meds: . enoxaparin (LOVENOX) injection  40 mg Subcutaneous Q24H  . escitalopram  5 mg Oral QHS  . levothyroxine  100 mcg Oral QAC breakfast  . mesalamine  1,500 mg Oral BID  . oxybutynin  5 mg Oral BID  . pantoprazole  40 mg Oral BH-q7a  . [START  ON 12/05/2017] predniSONE  15 mg Oral QPC breakfast   Continuous Infusions: . 0.9 % NaCl with KCl 20 mEq / L 100 mL/hr at 12/04/17 1359   PRN Meds:.albuterol, diphenhydrAMINE **OR** diphenhydrAMINE, morphine injection, ondansetron **OR** ondansetron (ZOFRAN) IV, polyvinyl alcohol, sodium chloride flush Medications Prior to Admission:  Prior to Admission medications   Medication Sig Start Date End Date Taking? Authorizing Provider  albuterol (VENTOLIN HFA) 108 (90 Base) MCG/ACT inhaler Inhale 2 puffs into the lungs 4 (four) times daily as needed for wheezing or shortness of breath.   Yes [provider]  carboxymethylcellulose (REFRESH PLUS) 0.5 % SOLN Place 1 drop into both eyes daily as needed (dry eyes).   Yes [provider]  clidinium-chlordiazePOXIDE (LIBRAX) 5-2.5 MG capsule Take 1 capsule by mouth daily as needed (stomach pain).    Yes [provider]  clopidogrel (PLAVIX) 75 MG tablet Take 75 mg by mouth daily. 04/26/17  Yes [provider]  escitalopram (LEXAPRO) 5 MG tablet Take 5 mg by mouth every evening 10/17/17  Yes [provider]  folic acid (FOLVITE) 1 MG tablet Take 1 mg by mouth daily.   Yes [provider]  levothyroxine (SYNTHROID, LEVOTHROID) 100 MCG tablet Take 100 mcg by mouth daily before breakfast.   Yes  [provider]  mesalamine (PENTASA) 500 MG CR capsule Take 1,500 mg by mouth 2 (two) times daily.   Yes [provider]  Multiple Vitamin (MULTIVITAMIN WITH MINERALS) TABS tablet Take 1 tablet by mouth daily.   Yes [provider]  ondansetron (ZOFRAN) 4 MG tablet Take 1 tablet (4 mg total) by mouth every 6 (six) hours as needed for nausea. 11/03/17  Yes Hosie Poisson, MD  oxybutynin (DITROPAN) 5 MG tablet Take 5 mg by mouth 2 (two) times daily.    Yes [provider]  oxyCODONE-acetaminophen (PERCOCET/ROXICET) 5-325 MG tablet Take 1 tablet every 4 (four) hours as needed by mouth for moderate pain. 09/30/17  Yes Mariel Aloe, MD  pantoprazole (PROTONIX) 40 MG tablet Take 40 mg by mouth every morning. 05/15/17  Yes [provider]  predniSONE (DELTASONE) 5 MG tablet Take 20 mg by mouth daily after breakfast.    Yes [provider]  protein supplement shake (PREMIER PROTEIN) LIQD Take 325 mLs (11 oz total) by mouth daily. 11/04/17  Yes Hosie Poisson, MD  sodium chloride (OCEAN) 0.65 % SOLN nasal spray Place 2 sprays as needed into both nostrils for congestion.   Yes [provider]  sodium chloride 0.9 % infusion Normal saline at 38ml/hr. Patient taking differently: daily. 500 cc NS, 12 meq Magnesium Sulfate infused over 4 hours. 125 cc/hr 11/03/17  Yes Hosie Poisson, MD  psyllium (HYDROCIL/METAMUCIL) 95 % PACK Take 1 packet by mouth 2 (two) times daily. Patient not taking: Reported on 11/29/2017 11/03/17   Hosie Poisson, MD   Allergies  Allergen Reactions  . Lidocaine Other (See Comments)    Claims that it made her heart beat go irregular   Review of Systems  Constitutional: Positive for fatigue.    Physical Exam  Constitutional: She is oriented to person, place, and time. She appears well-developed.  Cardiovascular: Normal rate, regular rhythm and normal heart sounds.  Pulmonary/Chest: Effort normal and breath sounds normal.    Abdominal: She exhibits distension.  - noted iliostomy  Neurological: She is alert and oriented to person, place, and time.    Vital Signs: BP 127/66 (BP Location: Left Arm)   Pulse 79  Temp 98.1 F (36.7 C) (Oral)   Resp 18   Ht 5\' 2"  (1.575 m)   Wt 66.9 kg (147 lb 7.8 oz)   SpO2 99%   BMI 26.98 kg/m  Pain Assessment: No/denies pain   Pain Score: 0-No pain   SpO2: SpO2: 99 % O2 Device:SpO2: 99 % O2 Flow Rate: .   IO: Intake/output summary:   Intake/Output Summary (Last 24 hours) at 12/04/2017 1525 Last data filed at 12/04/2017 1438 Gross per 24 hour  Intake 2455 ml  Output 2600 ml  Net -145 ml    LBM: Last BM Date: 12/04/17(ostomy) Baseline Weight: Weight: 66.9 kg (147 lb 7.8 oz) Most recent weight: Weight: 66.9 kg (147 lb 7.8 oz)     Palliative Assessment/Data: 30 % at best   Discussed with Dr Georgette Dover  Time In: 1230 Time Out: 1345 Time Total: 75 minutes Greater than 50%  of this time was spent counseling and coordinating care related to the above assessment and plan.  Signed by: Wadie Lessen, NP   Please contact Palliative Medicine Team phone at [phone] for questions and concerns.  For individual provider: See Shea Evans

## 2017-12-04 NOTE — Progress Notes (Signed)
Subjective/Chief Complaint: Patient resting comfortably Denies any abdominal pain, nausea at this time Very thin liquid ileostomy output   Objective: Vital signs in last 24 hours: Temp:  [97.7 F (36.5 C)-98.4 F (36.9 C)] 97.7 F (36.5 C) (01/22 0559) Pulse Rate:  [73-83] 73 (01/22 0559) Resp:  [18-20] 18 (01/22 0559) BP: (129-151)/(58-65) 147/59 (01/22 0559) SpO2:  [93 %-99 %] 93 % (01/22 0559) Last BM Date: 12/04/17(ostomy)  Intake/Output from previous day: 01/21 0701 - 01/22 0700 In: 2095 [P.O.:660; I.V.:1435] Out: 1700 [Urine:650; Stool:1050] Intake/Output this shift: Total I/O In: 360 [P.O.:360] Out: 900 [Urine:600; Stool:300]  Elderly female in NAD Lungs - CTA B CV - RRR Abd - soft, midline incision well-healed Ileostomy - flat, pink; thin watery output Abd is non-tender  Lab Results:  Recent Labs    12/03/17 1333 12/04/17 0742  WBC 11.3* 9.1  HGB 10.6* 10.3*  HCT 33.3* 32.6*  PLT 291 286   BMET Recent Labs    12/03/17 1333 12/04/17 0742  NA 134* 134*  K 3.7 4.7  CL 97* 100*  CO2 26 22  GLUCOSE 93 84  BUN 15 13  CREATININE 1.14* 1.00  CALCIUM 8.3* 8.2*   PT/INR No results for input(s): LABPROT, INR in the last 72 hours. ABG No results for input(s): PHART, HCO3 in the last 72 hours.  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 The patient had an omentectomy and resection of the terminal ileum and cecum with creation of an ileostomy at the time of laparotomy. Abdominal distention and discomfort for the past 2 weeks with decreased ileostomy output, nausea and vomiting. EXAM: CT ABDOMEN AND PELVIS WITH CONTRAST TECHNIQUE: Multidetector CT imaging of the abdomen and pelvis was performed using the standard protocol following bolus administration of  intravenous contrast. CONTRAST:  100 ml ISOVUE-300 IOPAMIDOL (ISOVUE-300) INJECTION 61% COMPARISON:  CT abdomen and pelvis 09/16/2017. FINDINGS: Lower chest: The patient has a small left pleural effusion. Trace right effusion is also noted. No pericardial effusion. Minimal dependent atelectasis on the right. Hepatobiliary: Status post cholecystectomy. Mild prominence of the intrahepatic biliary tree is likely related to the procedure and the patient's age. No focal liver lesion. Pancreas: Atrophic without focal lesion or surrounding inflammatory change. Spleen: Normal in size without focal abnormality. Adrenals/Urinary Tract: Adrenal glands are unremarkable. Kidneys are normal, without renal calculi, focal lesion, or hydronephrosis. Bladder is unremarkable. Stomach/Bowel: Since the prior examination, the patient has undergone resection of the terminal ileum and cecum. A new right lower quadrant ostomy is in place. Small bowel loops are distended up to approximately 5 cm with air-fluid levels present. Transition point is difficult to localize but appears to be centrally in the abdomen just medial to the patient's ileostomy where small bowel loops appear adhesed together. Also seen is mild dilatation of the ileum at the ostomy site. No parastomal hernia or other complicating feature is identified. The colon is near completely decompressed. The stomach appears normal. Vascular/Lymphatic: Atherosclerotic vascular disease is present. No lymphadenopathy is identified. Reproductive: Unremarkable. Other: There is a small volume of scattered abdominal and pelvic ascites. Nodularity is seen in the mid to lower mesenteric fat inferior to the spleen consistent with metastatic disease. Musculoskeletal: No lytic or sclerotic lesion.  No fracture. IMPRESSION: Findings consistent with small bowel obstruction. Transition point is difficult to localize but appears to be  just medial to the patient's ostomy site where loops are adhesed  together. Mild prominence of the ileum as it enters the ostomy is also noted but no obstructing lesion is identified. The ostomy appears normal. Nodularity fat in the left abdomen consistent with metastatic disease. Small volume of abdominal and pelvic ascites in association with metastatic disease is noted. Small left and trace right pleural effusions. Electronically Signed   By: Inge Rise M.D.   On: 12/03/2017 19:26    Anti-infectives: Anti-infectives (From admission, onward)   None      Assessment/Plan: Metastatic adenocarcinoma of the terminal ileum - s/p omentectomy/ ileocecectomy with end ileostomy 09/24/17. Diffuse peritoneal carcinomatosis.  The CT scan does not show a clear transition point, as would be typically be seen with adhesions.  The terminal ileum seems to be dilated all the way up to the ileostomy.  This makes me very concerned that the ileum is encased in tumor, which can cause intermittent cramping with peristalsis.  Surgical exploration at this time is not likely to improve her condition.  I spent some time with the patient and her family discussing the situation.  She would like to talk to Palliative Care to help consider her options.  I communicated with her Oncologist - Dr. Benay Spice, who will try to see her tomorrow.  We did discuss the possibility of a palliative gastrostomy tube to help empty her stomach if she becomes obstructed.  This will likely need to be a surgical g-tube, since it appears that there may be some transverse colon anterior to the stomach.  For now, we will continue liquids and IV hydration.  LOS: 1 day    Maia Petties 12/04/2017

## 2017-12-05 ENCOUNTER — Encounter (HOSPITAL_COMMUNITY): Payer: Self-pay

## 2017-12-05 ENCOUNTER — Other Ambulatory Visit: Payer: Self-pay

## 2017-12-05 DIAGNOSIS — C172 Malignant neoplasm of ileum: Secondary | ICD-10-CM

## 2017-12-05 DIAGNOSIS — R112 Nausea with vomiting, unspecified: Secondary | ICD-10-CM

## 2017-12-05 DIAGNOSIS — R531 Weakness: Secondary | ICD-10-CM

## 2017-12-05 DIAGNOSIS — K56609 Unspecified intestinal obstruction, unspecified as to partial versus complete obstruction: Secondary | ICD-10-CM

## 2017-12-05 DIAGNOSIS — C772 Secondary and unspecified malignant neoplasm of intra-abdominal lymph nodes: Secondary | ICD-10-CM

## 2017-12-05 DIAGNOSIS — E039 Hypothyroidism, unspecified: Secondary | ICD-10-CM

## 2017-12-05 DIAGNOSIS — R188 Other ascites: Secondary | ICD-10-CM

## 2017-12-05 DIAGNOSIS — K5090 Crohn's disease, unspecified, without complications: Secondary | ICD-10-CM

## 2017-12-05 NOTE — Progress Notes (Addendum)
Hospice and [facility]) Hospital Liaison:  RN visit.   Notified by Rosario Adie, of patient/family request for [facility] services at home after discharge.  Chart and patient information reviewed by [facility] physician.  Hospice eligibility confirmed.   Writer spoke with patient and daughter in law, Terri [name], at bedside to initiate education related to hospice philosophy, services and team approach to care.  Patient/family verbalized understanding of information given.  Per discussion, plan is for discharge to home by private vehicle on 12/06/17.  Please send signed and completed DNR form home with patient/family.  Patient will need presciprtions for discharge comfort medications.  Per [facility] physician, please leave PICC line in place.    DME needs have been discussed, patient currently has the following equipment in the home:  Walker, rollator, wheelchair, shower chair, BSC, ileostomy supplies/dressings (supplied by [facility]).  Patient/family requests the following DME for delivery to the home:  Hospital bed with 1/2 rails, OBT.  [facility] equipment manager has been notified and will contact [place] to arrange delivery to the home.  Home address has been verified and is correct in the chart.  Tamsun, daughter in law, is the family member to contact to arrange time of delivery.  Contact Tamsun at [phone] or Terri [name] at [phone].  [facility] aware of the above.  Completed discharge summary will need to be faxed to [facility] at [phone], when final.  Please notify [facility] when patient is ready to leave the unit at discharge.  Call [phone] or [phone] after 5pm.  [facility] information and contact numbers given to Terri [name] during this visit.  Above information shared with Nira Conn, [facility].  Please call with any hospice related questions.   Thank you for this referral.    Edyth Gunnels, RN, [facility] Hospital Liaison [phone]  All hospital liaisons are now on [place].

## 2017-12-05 NOTE — Care Management Note (Addendum)
Case Management Note  Patient Details  Name: Christeena Krogh MRN: [id number] Date of Birth: [birth year]  Subjective/Objective:                    Action/Plan:  Spoke with patient and family members at bedside. Patient has decided to go home with hospice. Expected discharge date is tomorrow 12-06-17. Choice offered. Patient and family would like Hospice and [facility].   Patient's husband passed away last 2023/02/10. Patient has wheel chair, shower chair, bedside commode already. Patient WILL NEED a hospital bed.    PCP DR [name]  Referral called to Amber at [place], they will arrange hospital bed. Tamsin [phone] will be in patient's home to accept hospital bed.  Patient will need ambulance transportation home. Confirmed face sheet address. [address], [place] [address]   Prior to admission patient had [place]. Neoma Laming aware patient changing to hospice care.   Expected Discharge Date:  12/07/17               Expected Discharge Plan:  Home w Hospice Care  In-House Referral:     Discharge planning Services  CM Consult  Post Acute Care Choice:  Durable Medical Equipment, Hospice Choice offered to:  Adult Children, Patient  DME Arranged:  Hospital bed DME Agency:  [place]:  NA HH Agency:  [facility]  Status of Service:  In process, will continue to follow  If discussed at Long Length of Stay Meetings, dates discussed:    Additional Comments:  Marilu Favre, RN 12/05/2017, 12:47 PM

## 2017-12-05 NOTE — Progress Notes (Addendum)
Subjective/Chief Complaint: Patient ambulating briskly in the hallways "I feel much better" Had discussion with Oncologist this morning She is leaning towards going home with Hospice care - she will discuss further with Palliative Care 1375 cc of thin ileostomy output   Objective: Vital signs in last 24 hours: Temp:  [97.9 F (36.6 C)-98.1 F (36.7 C)] 98.1 F (36.7 C) (01/23 0540) Pulse Rate:  [73-79] 74 (01/23 0540) Resp:  [18] 18 (01/23 0540) BP: (127-169)/(66-68) 134/66 (01/23 0540) SpO2:  [94 %-99 %] 97 % (01/23 0540) Last BM Date: 12/05/17  Intake/Output from previous day: 01/22 0701 - 01/23 0700 In: 2940 [P.O.:600; I.V.:2340] Out: 2375 [Urine:1000; NLZJQ:7341] Intake/Output this shift: Total I/O In: 200 [P.O.:200] Out: -   General appearance: alert, cooperative and no distress Abd - soft, non-tender; ileostomy with thin watery output Coccyx - slight redness; no open wound; non-blanchable; present prior to admission  Lab Results:  Recent Labs    12/03/17 1333 12/04/17 0742  WBC 11.3* 9.1  HGB 10.6* 10.3*  HCT 33.3* 32.6*  PLT 291 286   BMET Recent Labs    12/03/17 1333 12/04/17 0742  NA 134* 134*  K 3.7 4.7  CL 97* 100*  CO2 26 22  GLUCOSE 93 84  BUN 15 13  CREATININE 1.14* 1.00  CALCIUM 8.3* 8.2*   PT/INR No results for input(s): LABPROT, INR in the last 72 hours. ABG No results for input(s): PHART, HCO3 in the last 72 hours.  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 The patient had an omentectomy and resection of the terminal ileum and cecum with creation of an ileostomy at the time of laparotomy. Abdominal distention and discomfort for the past 2 weeks with decreased ileostomy output, nausea and vomiting. EXAM: CT ABDOMEN AND PELVIS WITH CONTRAST  TECHNIQUE: Multidetector CT imaging of the abdomen and pelvis was performed using the standard protocol following bolus administration of intravenous contrast. CONTRAST:  100 ml ISOVUE-300 IOPAMIDOL (ISOVUE-300) INJECTION 61% COMPARISON:  CT abdomen and pelvis 09/16/2017. FINDINGS: Lower chest: The patient has a small left pleural effusion. Trace right effusion is also noted. No pericardial effusion. Minimal dependent atelectasis on the right. Hepatobiliary: Status post cholecystectomy. Mild prominence of the intrahepatic biliary tree is likely related to the procedure and the patient's age. No focal liver lesion. Pancreas: Atrophic without focal lesion or surrounding inflammatory change. Spleen: Normal in size without focal abnormality. Adrenals/Urinary Tract: Adrenal glands are unremarkable. Kidneys are normal, without renal calculi, focal lesion, or hydronephrosis. Bladder is unremarkable. Stomach/Bowel: Since the prior examination, the patient has undergone resection of the terminal ileum and cecum. A new right lower quadrant ostomy is in place. Small bowel loops are distended up to approximately 5 cm with air-fluid levels present. Transition point is difficult to localize but appears to be centrally in the abdomen just medial to the patient's ileostomy where small bowel loops appear adhesed together. Also seen is mild dilatation of the ileum at the ostomy site. No parastomal hernia or other complicating feature is identified. The colon is near completely decompressed. The stomach appears normal. Vascular/Lymphatic: Atherosclerotic vascular disease is present. No lymphadenopathy is identified. Reproductive: Unremarkable. Other: There is a small volume of scattered abdominal and pelvic ascites. Nodularity is seen in the mid to lower mesenteric fat inferior to the spleen consistent with metastatic disease. Musculoskeletal: No lytic  or sclerotic lesion.  No fracture. IMPRESSION: Findings consistent with small bowel  obstruction. Transition point is difficult to localize but appears to be just medial to the patient's ostomy site where loops are adhesed together. Mild prominence of the ileum as it enters the ostomy is also noted but no obstructing lesion is identified. The ostomy appears normal. Nodularity fat in the left abdomen consistent with metastatic disease. Small volume of abdominal and pelvic ascites in association with metastatic disease is noted. Small left and trace right pleural effusions. Electronically Signed   By: Inge Rise M.D.   On: 12/03/2017 19:26    Anti-infectives: Anti-infectives (From admission, onward)   None      Assessment/Plan: Metastatic adenocarcinoma of the terminal ileum - s/p omentectomy/ ileocecectomy with end ileostomy 09/24/17. Diffuse peritoneal carcinomatosis. Intermittent SBO - distal SB leading to ileostomy dilated - diffuse peritoneal carcinomatosis No apparent surgical intervention will help with this problem Grade I sacral pressure injury - foam dressing, mobilize  Patient is doing quite well with IV hydration and full liquids. She wants to discuss further with Palliative Care, Oncology, and family.  Hopefully home in next 1-2 days   LOS: 2 days    Maia Petties 12/05/2017

## 2017-12-05 NOTE — Consult Note (Signed)
[place] Nurse ostomy follow up Stoma type/location: RLQ, end ileostomy Stomal assessment/size: pouch intact, changed during the night by bedside RN Peristomal assessment: NA Treatment options for stomal/peristomal skin: NA Output liquid yellow  Ostomy pouching: 1pc.flat with barrier ring Education provided: Supplies are in room for 5 changes, patient very pleased with bedside nursing staff and [facility] team this hospital stay. Bedside RN replaced pouch last night and patient said the nurse knew just what to do. Pouch is intact currently. Patient denies further needs. Enrolled patient in [place] Start Discharge program: N/A We will follow this patient and remain available to this patient, nursing, and the medical and surgical teams.  Fara Olden, RN-C, WTA-C, [facility] Wound Treatment Associate Ostomy Care Associate

## 2017-12-05 NOTE — Progress Notes (Signed)
Patient ID: Terri [name], female   DOB: 09-02-1931, 84 y.o.   MRN: [id number]  This NP visited patient at the bedside as a follow up to  yesterday's [place] for palliative needs and emotional support.  Met with family to include two daughter- in -laws and grandson to continue discussion regarding diagnosis, prognosis, goals of care and end-of-life wishes..  Patient understands her limited prognosis and has made decision to go home with hospice.  This vibrant lady speaks to living "one day at a time" and hopes to have "some good days ahead of her."  We discussed hospice benefit in the home and will write for choice.   Goals of care:   Comfort and dignity are the priority of care  -DNR/DNI -No treatment recommended for her cancer diagnosis at this time, secondary to poor functional status -Liquid diet as tolerated, patient is open to intermittent IV fluids to maintain hydration in the future -Discharge home with hospice  Discussed with patient the importance of continued conversation with family and their  medical providers regarding overall plan of care and treatment options,  ensuring decisions are within the context of the patients values and GOCs.  Questions and concerns addressed   Discussed with Dr Georgette Dover  Time in 1200          Time out  1245   Total time spent on the unit was 45 minutes   Greater than 50% of the time was spent in counseling and coordination of care  Wadie Lessen NP  Palliative Medicine Team Team Phone # [phone] Pager [phone]

## 2017-12-05 NOTE — Progress Notes (Signed)
IP PROGRESS NOTE  Subjective:   Terri [name] was discharged 119 after admission with a small bowel obstruction.  She developed recurrent nausea/vomiting and decreased output from the ileostomy and was readmitted 12/03/2017. A CT of the abdomen and pelvis 12/03/2017 revealed a small bowel obstruction with a transition point appearing to localize medial to the ostomy.  Nodularity in the left abdominal fat and small volume ascites is noted. Dr. Georgette Dover does not recommend surgical exploration.  He discussed placement of a gastrostomy tube.  She feels better at present.  There is output from the ileostomy.  She met with the palliative care team.   Objective: Vital signs in last 24 hours: Blood pressure 134/66, pulse 74, temperature 98.1 F (36.7 C), temperature source Oral, resp. rate 18, height 5' 2"  (1.575 m), weight 147 lb 7.8 oz (66.9 kg), SpO2 97 %.  Intake/Output from previous day: 01/22 0701 - 01/23 0700 In: 2940 [P.O.:600; I.V.:2340] Out: 2375 [Urine:1000; ZOXWR:6045]  Physical Exam:  HEENT: The mucous membranes are moist Abdomen: Nondistended, no mass, right abdomen ileostomy with liquid stool Extremities: No leg edema   Portacath/PICC-without erythema  Lab Results: Recent Labs    12/03/17 1333 12/04/17 0742  WBC 11.3* 9.1  HGB 10.6* 10.3*  HCT 33.3* 32.6*  PLT 291 286    BMET Recent Labs    12/03/17 1333 12/04/17 0742  NA 134* 134*  K 3.7 4.7  CL 97* 100*  CO2 26 22  GLUCOSE 93 84  BUN 15 13  CREATININE 1.14* 1.00  CALCIUM 8.3* 8.2*    Lab Results  Component Value Date   CEA1 6.49 (H) 10/24/2017    Studies/Results: Ct Abdomen Pelvis W Contrast  Result Date: 12/03/2017 CLINICAL DATA:  The patient underwent exploratory laparotomy 09/24/2017 and was 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 The patient had an omentectomy and resection of the terminal ileum and cecum with creation of an ileostomy at the time of laparotomy. Abdominal  distention and discomfort for the past 2 weeks with decreased ileostomy output, nausea and vomiting. EXAM: CT ABDOMEN AND PELVIS WITH CONTRAST TECHNIQUE: Multidetector CT imaging of the abdomen and pelvis was performed using the standard protocol following bolus administration of intravenous contrast. CONTRAST:  100 ml ISOVUE-300 IOPAMIDOL (ISOVUE-300) INJECTION 61% COMPARISON:  CT abdomen and pelvis 09/16/2017. FINDINGS: Lower chest: The patient has a small left pleural effusion. Trace right effusion is also noted. No pericardial effusion. Minimal dependent atelectasis on the right. Hepatobiliary: Status post cholecystectomy. Mild prominence of the intrahepatic biliary tree is likely related to the procedure and the patient's age. No focal liver lesion. Pancreas: Atrophic without focal lesion or surrounding inflammatory change. Spleen: Normal in size without focal abnormality. Adrenals/Urinary Tract: Adrenal glands are unremarkable. Kidneys are normal, without renal calculi, focal lesion, or hydronephrosis. Bladder is unremarkable. Stomach/Bowel: Since the prior examination, the patient has undergone resection of the terminal ileum and cecum. A new right lower quadrant ostomy is in place. Small bowel loops are distended up to approximately 5 cm with air-fluid levels present. Transition point is difficult to localize but appears to be centrally in the abdomen just medial to the patient's ileostomy where small bowel loops appear adhesed together. Also seen is mild dilatation of the ileum at the ostomy site. No parastomal hernia or other complicating feature is identified. The colon is near completely decompressed. The stomach appears normal. Vascular/Lymphatic: Atherosclerotic vascular disease is present. No lymphadenopathy is identified. Reproductive: Unremarkable. Other: There is a small volume of scattered abdominal  and pelvic ascites. Nodularity is seen in the mid to lower mesenteric fat inferior to the spleen  consistent with metastatic disease. Musculoskeletal: No lytic or sclerotic lesion.  No fracture. IMPRESSION: Findings consistent with small bowel obstruction. Transition point is difficult to localize but appears to be just medial to the patient's ostomy site where loops are adhesed together. Mild prominence of the ileum as it enters the ostomy is also noted but no obstructing lesion is identified. The ostomy appears normal. Nodularity fat in the left abdomen consistent with metastatic disease. Small volume of abdominal and pelvic ascites in association with metastatic disease is noted. Small left and trace right pleural effusions. Electronically Signed   By: Inge Rise M.D.   On: 12/03/2017 19:26    Medications: I have reviewed the patient's current medications.  Assessment/Plan:  1. 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 pT4, pN1, pM1 ? RASwild-type,MSI-stable, mutation burden-6, [id number] ATM alterations ? CT abdomen/pelvis 09/16/2017-high-grade small bowel obstruction with transition point at the terminal ileum, nodularity of the omentum ? Diffuse peritoneal implants noted at the time of exploratory laparotomy 09/24/2017  2. Crohn's disease 3. Macular degeneration 4. Aortic insufficiency 5. History of a TIA 6. History of a positive PPD 7. History of a DVT following knee meniscus surgery 8. History of "typhoid "fever 9. Hypothyroid 10.Severe dehydration, renal failure? 10/24/2017-normalization of creatinine following intravenous hydration, severe dehydration secondary to a high output ileostomy, improved 11.  Admission with clinical and x-ray evidence of a small bowel obstruction 11/29/2017 12.  Admission 12/03/2017 with recurrent small bowel obstruction  CT 12/03/2017- small bowel obstruction, omental nodularity, ascites  Terri [name] is readmitted with a small bowel  obstruction.  The obstructive symptoms now appear improved.  She appears to be having intermittent small bowel obstruction, likely related to abdominal carcinomatosis.  The combination of a high output ileostomy and intermittent obstruction will make it difficult for her to receive a trial of systemic chemotherapy.  Ms. Gaal understands this.  She agrees to home with Hospice care.  She would like to receive intravenous fluids as needed via the PICC.  She does not have significant visceral disease and I expect she could live for several months if she is able to maintain her hydration status.  Recommendations: 1.  [facility] hospice referral for home care 2.  Liquid diet as tolerated 3.  Decision on continuing antidiarrhea regimen per Dr. Georgette Dover 4.  Continue IV fluids as needed 5.  I will follow her with the [facility] hospice program at discharge      LOS: 2 days   Betsy Coder, MD   12/05/2017, 2:43 PM

## 2017-12-06 ENCOUNTER — Telehealth: Payer: Self-pay | Admitting: *Deleted

## 2017-12-06 MED ORDER — LOPERAMIDE HCL 2 MG PO CAPS
2.0000 mg | ORAL_CAPSULE | Freq: Four times a day (QID) | ORAL | Status: DC
  Administered 2017-12-06: 2 mg via ORAL
  Filled 2017-12-06: qty 1

## 2017-12-06 MED ORDER — HEPARIN SOD (PORK) LOCK FLUSH 100 UNIT/ML IV SOLN
250.0000 [IU] | INTRAVENOUS | Status: AC | PRN
  Administered 2017-12-06: 250 [IU]

## 2017-12-06 MED ORDER — ONDANSETRON 4 MG PO TBDP: 4.0000 mg | ORAL_TABLET | Freq: Four times a day (QID) | ORAL | 2 refills | Status: AC | PRN

## 2017-12-06 NOTE — Plan of Care (Signed)
Nutrition Education Note  RD consulted for nutrition education regarding soft diet.   Spoke with RN, who reports pt is requesting diet education prior to discharge.   Spoke with pt, who reports that she is discharging on a full liquid diet with inclusion of soft foods as tolerated. She is seeking clarification on soft diet prior to discharge.    RD provided "Fiber Restricted Nutrition Therapy" and "Full Liquid Diet" handouts from the Academy of Nutrition and Dietetics.  Explained reasons for pt to follow a low fiber diet when consuming solid foods. Reviewed low fiber foods and high fiber foods. Encouraged pt continue full liquid diet and increase to low fiber diet as tolerated.  Provided examples of low fiber foods that pt enjoys.   Teach back method used. Pt verbalizes understanding of information provided and very appreciative of visit.    Expect fair to good compliance.  Body mass index is Body mass index is 26.98 kg/m.Marland Kitchen Pt meets criteria for overweight based on current BMI.  Current diet order is full liquid, patient is consuming approximately 50-75% of meals at this time. Labs and medications reviewed. No further nutrition interventions warranted at this time. RD contact information provided. If additional nutrition issues arise, please re-consult RD.  Terri [name] Terri [name], RD, LDN, CDE Pager: [phone] After hours Pager: [phone]

## 2017-12-06 NOTE — Progress Notes (Signed)
[facility] - RN update  Spoke with Nira Conn CMRN to check on discharge planning and to confirm discharge still planned for today.   Then received confirmation from [facility] that equipment was delivered to patient home last night.  Please call with any hospice related questions or concerns.  Thank you,   Gar Ponto, [facility] Liaison [phone]

## 2017-12-06 NOTE — Care Management Note (Signed)
Case Management Note  Patient Details  Name: Terri [name] MRN: [id number] Date of Birth: 1931-01-03  Subjective/Objective:                    Action/Plan:  Hospital bed was delivered to patient's home last night 2030. Patient and nurse ready for [facility] to be called. Patient's daughter in law at home waiting for patient arrival. Corey Harold est time is one hour. Patient and nurse aware. Expected Discharge Date:  12/06/17               Expected Discharge Plan:  Home w Hospice Care  In-House Referral:     Discharge planning Services  CM Consult  Post Acute Care Choice:  Durable Medical Equipment, Hospice Choice offered to:  Adult Children, Patient  DME Arranged:  Hospital bed DME Agency:  [place]:  NA HH Agency:  [facility]  Status of Service:  Completed, signed off  If discussed at [place] of Stay Meetings, dates discussed:    Additional Comments:  Marilu Favre, RN 12/06/2017, 12:50 PM

## 2017-12-06 NOTE — Discharge Summary (Signed)
Physician Discharge Summary  Patient ID: Terri [name] MRN: [id number] DOB/AGE: 1931-07-19 84 y.o.  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  Patient improved significantly with hydration.  CT scan showed dilation of the distal small bowel all the way to the ileostomy with widespread peritoneal tumor burden.  This is felt to be consistent with encasement of the distal small bowel with tumor.  Surgery is not likely to be helpful with this unresectable metastatic cancer.  After discussion with the patient, her family, and Palliative Care, she wishes to go home with Hospice Care, taking liquids and occasional soft diet.  She will receive IV fluids as needed via PICC line at home to maintain hydration.  She will have PRN nausea and pain medication.  Consults: Palliative Care and Oncology  Significant Diagnostic Studies: CT abdomen and pelvis  Treatments: IV hydration  Discharge Exam: Blood pressure (!) 135/56, pulse 82, temperature 98 F (36.7 C), temperature source Oral, resp. rate 18, height 5\' 2"  (1.575 m), weight 66.9 kg (147 lb 7.8 oz), SpO2 97 %. General appearance: alert, cooperative and no distress GI: soft, non-tender, viable ileostomy with thin brownish output  Disposition: 06-Home-Health Care Svc  Discharge Instructions    Call MD for:  persistant nausea and vomiting   Complete by:  As directed    Call MD for:  redness, tenderness, or signs of infection (pain, swelling,  redness, odor or green/yellow discharge around incision site)   Complete by:  As directed    Call MD for:  severe uncontrolled pain   Complete by:  As directed    Call MD for:  temperature >100.4   Complete by:  As directed    Diet general   Complete by:  As directed    Driving Restrictions   Complete by:  As directed    Do not drive while taking pain medications   Increase activity slowly   Complete by:  As directed    May shower / Bathe   Complete by:  As directed      Allergies as of 12/06/2017      Reactions   Lidocaine Other (See Comments)   Claims that it made her heart beat go irregular      Medication List    STOP taking these medications   mesalamine 500 MG CR capsule Commonly known as:  PENTASA   ondansetron 4 MG tablet Commonly known as:  ZOFRAN   psyllium 95 % Pack Commonly known as:  HYDROCIL/METAMUCIL     TAKE these medications   carboxymethylcellulose 0.5 % Soln Commonly known as:  REFRESH PLUS Place 1 drop into both eyes daily as needed (dry eyes).   clidinium-chlordiazePOXIDE 5-2.5 MG capsule Commonly known as:  LIBRAX Take 1 capsule by mouth daily as needed (stomach pain).   clopidogrel 75 MG tablet Commonly known as:  PLAVIX Take 75 mg by mouth daily.   escitalopram 5 MG tablet Commonly known as:  LEXAPRO Take 5 mg by mouth every evening   folic acid 1  MG tablet Commonly known as:  FOLVITE Take 1 mg by mouth daily.   levothyroxine 100 MCG tablet Commonly known as:  SYNTHROID, LEVOTHROID Take 100 mcg by mouth daily before breakfast.   multivitamin with minerals Tabs tablet Take 1 tablet by mouth daily.   ondansetron 4 MG disintegrating tablet Commonly known as:  ZOFRAN ODT Take 1 tablet (4 mg total) by mouth every 6 (six) hours as needed for nausea or vomiting.   oxybutynin 5 MG tablet Commonly known as:  DITROPAN Take 5 mg by mouth 2 (two) times daily.   oxyCODONE-acetaminophen 5-325 MG tablet Commonly known as:   PERCOCET/ROXICET Take 1 tablet every 4 (four) hours as needed by mouth for moderate pain.   pantoprazole 40 MG tablet Commonly known as:  PROTONIX Take 40 mg by mouth every morning.   predniSONE 5 MG tablet Commonly known as:  DELTASONE Take 20 mg by mouth daily after breakfast.   protein supplement shake Liqd Commonly known as:  PREMIER PROTEIN Take 325 mLs (11 oz total) by mouth daily.   sodium chloride 0.65 % Soln nasal spray Commonly known as:  OCEAN Place 2 sprays as needed into both nostrils for congestion.   sodium chloride 0.9 % infusion Normal saline at 3ml/hr. What changed:    when to take this  additional instructions   VENTOLIN HFA 108 (90 Base) MCG/ACT inhaler Generic drug:  albuterol Inhale 2 puffs into the lungs 4 (four) times daily as needed for wheezing or shortness of breath.      Follow-up Information    Donnie Mesa, MD Follow up in 2 week(s).   Specialty:  General Surgery Contact information: [address] [place] [address] [phone]           Signed: Maia Petties 12/06/2017, 10:53 AM

## 2017-12-06 NOTE — Discharge Instructions (Signed)
Ileostomy output - You may use Imodium 4 mg once or twice a day to try to slow down the output.  If it causes cramping or distention, do not use the Imodium.  Stop taking the Questran  Hydration - your total daily intake of oral liquids and IV fluids should at least be as much as the output from the ileostomy  I would take as much of diet as full liquids if possible, but you may try some solid foods if desired.  Eat small meals more frequently, rather than large meals.

## 2017-12-06 NOTE — Telephone Encounter (Signed)
Returned call to Carolynn Sayers, RN with verbal order to resume IV fluids at home. Normal saline 524mL QD. Discontinue IV magnesium, per Dr. Benay Spice.

## 2017-12-06 NOTE — Progress Notes (Signed)
IP PROGRESS NOTE  Subjective:   Terri [name] is tolerating a liquid diet.  There is output from the ostomy.  She plans to go home today.   Objective: Vital signs in last 24 hours: Blood pressure (!) 135/56, pulse 82, temperature 98 F (36.7 C), temperature source Oral, resp. rate 18, height _0  (1.575 m), weight 147 lb 7.8 oz (66.9 kg), SpO2 97 %.  Intake/Output from previous day: 01/23 0701 - 01/24 0700 In: 3125 [P.O.:800; I.V.:2325] Out: 2800 [Urine:800; Stool:2000]  Physical Exam:  HEENT: The mucous membranes are moist Abdomen: Nondistended, no mass, right abdomen ileostomy with liquid stool Extremities: No leg edema   Portacath/PICC-without erythema  Lab Results: Recent Labs    12/03/17 1333 12/04/17 0742  WBC 11.3* 9.1  HGB 10.6* 10.3*  HCT 33.3* 32.6*  PLT 291 286    BMET Recent Labs    12/03/17 1333 12/04/17 0742  NA 134* 134*  K 3.7 4.7  CL 97* 100*  CO2 26 22  GLUCOSE 93 84  BUN 15 13  CREATININE 1.14* 1.00  CALCIUM 8.3* 8.2*    Lab Results  Component Value Date   CEA1 6.49 (H) 10/24/2017    Studies/Results: No results found.  Medications: I have reviewed the patient's current medications.  Assessment/Plan:  1. 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 pT4, pN1, pM1 ? RASwild-type,MSI-stable, mutation burden-6, [id number] ATM alterations ? CT abdomen/pelvis 09/16/2017-high-grade small bowel obstruction with transition point at the terminal ileum, nodularity of the omentum ? Diffuse peritoneal implants noted at the time of exploratory laparotomy 09/24/2017  2. Crohn's disease 3. Macular degeneration 4. Aortic insufficiency 5. History of a TIA 6. History of a positive PPD 7. History of a DVT following knee meniscus surgery 8. History of "typhoid "fever 9. Hypothyroid 10.Severe dehydration, renal failure?  10/24/2017-normalization of creatinine following intravenous hydration, severe dehydration secondary to a high output ileostomy, improved 11.  Admission with clinical and x-ray evidence of a small bowel obstruction 11/29/2017 12.  Admission 12/03/2017 with recurrent small bowel obstruction  CT 12/03/2017- small bowel obstruction, omental nodularity, ascites  Ms. Onder appears well today.  The obstructive symptoms have resolved in the hospital.  She had a large volume of output from the ileostomy yesterday.  I am concerned she will develop dehydration without IV fluids and and antidiarrhea regimen at home.  I will resume Imodium if Dr. Georgette Dover is in agreement.  I will see her back in approximately 2 weeks.  If she continues to tolerate a liquid diet and there are no obstructive symptoms we can reconsider a trial of chemotherapy.  Recommendations: 1.  Full liquid diet 2.  Continue home IV fluids 3.  Resume Imodium 4.  Outpatient follow-up at the Cancer center during the week of 12/17/2017     LOS: 3 days   Betsy Coder, MD   12/06/2017, 6:40 AM

## 2017-12-06 NOTE — Progress Notes (Addendum)
Terri [name] to be D/C'd  per MD order. Discussed with the patient and all questions fully answered.  VSS, Skin clean, dry and intact without evidence of skin break down, no evidence of skin tears noted.  Patient d/c with PICC line for home infusions, capped off through IV team.  An After Visit Summary was printed and given to the patient. Patient received prescription.  D/c education completed with patient/family including follow up instructions, medication list, d/c activities limitations if indicated, with other d/c instructions as indicated by MD - patient able to verbalize understanding, all questions fully answered.   Patient instructed to return to ED, call 911, or call MD for any changes in condition.   Patient to be escorted via [facility], awaiting their transport.

## 2017-12-06 NOTE — Care Management Note (Signed)
Case Management Note  Patient Details  Name: Terri [name] MRN: [id number] Date of Birth: [birth year]  Subjective/Objective:                    Action/Plan: Once patient has been discharge, will confirm hospital bed was delivered and then arrange PTAR .   Gold DNR form placed in shadow chart for MD signature  Expected Discharge Date:  12/07/17               Expected Discharge Plan:  Home w Hospice Care  In-House Referral:     Discharge planning Services  CM Consult  Post Acute Care Choice:  Durable Medical Equipment, Hospice Choice offered to:  Adult Children, Patient  DME Arranged:  Hospital bed DME Agency:  [place]:  NA HH Agency:  [facility]  Status of Service:  In process, will continue to follow  If discussed at Long Length of Stay Meetings, dates discussed:    Additional Comments:  Marilu Favre, RN 12/06/2017, 10:30 AM

## 2017-12-11 ENCOUNTER — Telehealth: Payer: Self-pay

## 2017-12-11 ENCOUNTER — Ambulatory Visit: Payer: Medicare Other | Admitting: Cardiology

## 2017-12-11 NOTE — Telephone Encounter (Signed)
Received call from [facility] with [facility] stating that fluid orders for pt needed to be faxed to [facility]. Apolonio Schneiders states "my supervisor instructed me to call". This RN voiced understanding.   Called to speak with Carolynn Sayers with [facility] to inquire about need for faxed order. Pam states "we have everything we need and we will contact [facility] and inform them". This RN voiced understanding.

## 2017-12-20 ENCOUNTER — Telehealth: Payer: Self-pay | Admitting: Oncology

## 2017-12-20 ENCOUNTER — Inpatient Hospital Stay: Payer: Medicare Other | Attending: Oncology | Admitting: Oncology

## 2017-12-20 VITALS — BP 143/57 | HR 82 | Temp 98.5°F | Resp 18 | Ht 62.0 in | Wt 155.3 lb

## 2017-12-20 DIAGNOSIS — R188 Other ascites: Secondary | ICD-10-CM | POA: Insufficient documentation

## 2017-12-20 DIAGNOSIS — C172 Malignant neoplasm of ileum: Secondary | ICD-10-CM | POA: Insufficient documentation

## 2017-12-20 DIAGNOSIS — E039 Hypothyroidism, unspecified: Secondary | ICD-10-CM | POA: Diagnosis not present

## 2017-12-20 DIAGNOSIS — K56609 Unspecified intestinal obstruction, unspecified as to partial versus complete obstruction: Secondary | ICD-10-CM

## 2017-12-20 DIAGNOSIS — C762 Malignant neoplasm of abdomen: Secondary | ICD-10-CM

## 2017-12-20 NOTE — Progress Notes (Signed)
[facility] OFFICE PROGRESS NOTE   Diagnosis: [place] carcinoma  INTERVAL HISTORY:   [name] was discharged 12/06/2017 after an admission with small bowel obstruction.  The obstructive symptoms improved with bowel rest.  A decision was made against an attempt at surgery.  She was discharged home with Hospice care. She continues home IV fluids daily.  She reports approximately 1300 cc of liquid output per the ileostomy daily.  The output chiefly occurs at night.  She has intermittent episodes of "kinking "of the bowel where the ostomy output diminishes She fell at a [employer] Bowl party and injured the scalp superior to the right eye. Objective:  Vital signs in last 24 hours:  Blood pressure (!) 143/57, pulse 82, temperature 98.5 F (36.9 C), temperature source Oral, resp. rate 18, height 5' 2"  (1.575 m), weight 155 lb 4.8 oz (70.4 kg), SpO2 98 %.    HEENT: No thrush, the mouth is moist, healing laceration superior to the right orbit Resp: Bilaterally Cardio: Regular rate and rhythm GI: No hepatomegaly, nontender, right lower quadrant ileostomy Vascular: Trace edema at the left greater than right lower leg   Portacath/PICC-without erythema  Lab Results:  Lab Results  Component Value Date   WBC 9.1 12/04/2017   HGB 10.3 (L) 12/04/2017   HCT 32.6 (L) 12/04/2017   MCV 85.6 12/04/2017   PLT 286 12/04/2017   NEUTROABS 5.5 12/01/2017    CMP     Component Value Date/Time   NA 134 (L) 12/04/2017 0742   NA 138 11/14/2017 1620   K 4.7 12/04/2017 0742   K 4.5 11/14/2017 1620   CL 100 (L) 12/04/2017 0742   CO2 22 12/04/2017 0742   CO2 24 11/14/2017 1620   GLUCOSE 84 12/04/2017 0742   GLUCOSE 103 11/14/2017 1620   BUN 13 12/04/2017 0742   BUN 11.3 11/14/2017 1620   CREATININE 1.00 12/04/2017 0742   CREATININE 1.21 (H) 11/29/2017 1139   CREATININE 1.0 11/14/2017 1620   CALCIUM 8.2 (L) 12/04/2017 0742   CALCIUM 8.0 (L) 11/14/2017 1620   PROT 5.9 (L)  12/03/2017 1333   PROT 6.7 11/14/2017 1620   ALBUMIN 2.8 (L) 12/03/2017 1333   ALBUMIN 2.8 (L) 11/14/2017 1620   AST 19 12/03/2017 1333   AST 21 11/29/2017 1139   AST 19 11/14/2017 1620   ALT 14 12/03/2017 1333   ALT 24 11/29/2017 1139   ALT 16 11/14/2017 1620   ALKPHOS 71 12/03/2017 1333   ALKPHOS 114 11/14/2017 1620   BILITOT 0.7 12/03/2017 1333   BILITOT 0.7 11/29/2017 1139   BILITOT 0.31 11/14/2017 1620   GFRNONAA 50 (L) 12/04/2017 0742   GFRNONAA 40 (L) 11/29/2017 1139   GFRAA 58 (L) 12/04/2017 0742   GFRAA 46 (L) 11/29/2017 1139    Lab Results  Component Value Date   CEA1 6.49 (H) 10/24/2017    Lab Results  Component Value Date   INR 1.19 11/30/2017     Medications: I have reviewed the patient's current medications.   Assessment/Plan: 1. 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 pT4, pN1, pM1 ? RASwild-type,MSI-stable, mutation burden-6, [id number] ATM alterations ? CT abdomen/pelvis 09/16/2017-high-grade small bowel obstruction with transition point at the terminal ileum, nodularity of the omentum ? Diffuse peritoneal implants noted at the time of exploratory laparotomy 09/24/2017  2. Crohn's disease 3. Macular degeneration 4. Aortic insufficiency 5. History of a TIA 6. History of a  positive PPD 7. History of a DVT following knee meniscus surgery 8. History of "typhoid "fever 9. Hypothyroid 10.Severe dehydration, renal failure? 10/24/2017-normalization of creatinine following intravenous hydration, severe dehydration secondary to a high output ileostomy, improved 11.Admission with clinical and x-ray evidence of a small bowel obstruction 11/29/2017 12.  Admission 12/03/2017 with recurrent small bowel obstruction  CT 12/03/2017- small bowel obstruction, omental nodularity, ascites   Disposition: [name] appears stable.  She continues to have  intermittent obstructive symptoms.  She is taking a liquid/soft diet as tolerated.  She continues home IV fluids.  She is followed by a home Hospice RN.  She is scheduled to see Dr. Georgette Dover in approximately 2 weeks.  She will return for an office visit here on 01/16/2018.  We are available to see her sooner as needed.   We discussed the possibility of a trial of chemotherapy if her status remained stable.  15 minutes were spent with the patient today.  The majority of the time was used for counseling and coordination of care.  Betsy Coder, MD  12/20/2017  12:25 PM

## 2017-12-20 NOTE — Telephone Encounter (Signed)
Scheduled appts per 2/7 los - patient did not want avs or calendar.

## 2018-01-08 ENCOUNTER — Other Ambulatory Visit: Payer: Self-pay | Admitting: Oncology

## 2018-01-09 ENCOUNTER — Telehealth: Payer: Self-pay

## 2018-01-09 NOTE — Telephone Encounter (Addendum)
Received call from son regarding pt symtpoms. Son states " shes had trouble with persistent vomiting for the past 3-4 days, hasn't really been able to keep anything down. Her stoma is still having output". Pt also states " I feel weak and I'm vomiting small amounts. Zofran and compazine are helping a little". Son questions "should we bump her prednisone up to help with nausea/vomiting"? Per Dr. Benay Spice pt to be NPO for a few days and rest bowels and to increase home IVF. Son states "shes getting 1L over 4hours". Will make MD aware. Also offered that they can come in for appt tomorrow. Son will return call with decision.   Son called to state "my mom is actually feeling a little better. She drank half a boost and has kept it down and her ostomy output has increased, so she declined to come the appt tomorrow". Son also stated "She is going to try to do slow liquid intake vs NPO". Per Dr. Benay Spice, this is fine and IVF will remain the same.

## 2018-01-10 ENCOUNTER — Telehealth: Payer: Self-pay | Admitting: *Deleted

## 2018-01-10 ENCOUNTER — Ambulatory Visit: Payer: Medicare Other | Admitting: Nurse Practitioner

## 2018-01-10 NOTE — Telephone Encounter (Signed)
Call from [place], Hospice RN: Pt's family is requesting to increase IV fluids. Hospice MD is in agreement. Reviewed with Dr. Benay Spice: Increase to 1.5liters over 8 hours. (May be broken into 747mL over 4 hours BID). Apolonio Schneiders notified, she requested we update [place]. Notified Amy, in [place].

## 2018-01-10 NOTE — Telephone Encounter (Signed)
Message from pt's son reporting the hospice nurse will be out around 11AM. They will not come in for appointment today. He will call back with an update after the nurse leaves.

## 2018-01-10 NOTE — Telephone Encounter (Signed)
Call from son: pt drank half a boost and some pedialyte. Output stopped overnight and pt is vomiting this morning. Son would like to bring her in today. He reports they called Dr. Vonna Kotyk office and was told to call Dr. Benay Spice. Appt given for 1115.

## 2018-01-11 ENCOUNTER — Telehealth: Payer: Self-pay | Admitting: *Deleted

## 2018-01-11 NOTE — Telephone Encounter (Signed)
Call from pt's son asking if increasing Prednisone dose would help her obstructive symptoms. He understands she is declining but thinks this may improve her quality of life or "prolong the inevitable." Son states "she's been on 15mg  forever, I know it isn't a long term fix but may give her a little boost."

## 2018-01-11 NOTE — Telephone Encounter (Signed)
Discussed son's call with Dr. Benay Spice: Prednisone may help with nausea or increase her appetite. It will not help the obstruction. OK to double it up over the weekend to see if it helps. Elba Barman made aware. He voiced understanding.

## 2018-01-14 ENCOUNTER — Telehealth: Payer: Self-pay | Admitting: *Deleted

## 2018-01-14 DIAGNOSIS — C762 Malignant neoplasm of abdomen: Secondary | ICD-10-CM

## 2018-01-14 DIAGNOSIS — R112 Nausea with vomiting, unspecified: Secondary | ICD-10-CM

## 2018-01-14 MED ORDER — PREDNISONE 20 MG PO TABS: 30.0000 mg | ORAL_TABLET | Freq: Every day | ORAL | 0 refills | Status: AC

## 2018-01-14 NOTE — Telephone Encounter (Signed)
Spoke with pt's son, he reports they increased prednisone to 60mg  instead of 30 as recommended by MD. He reports she felt better after each dose of prednisone. (30mg  BID). He reports she had output from ostomy after taking prednisone. He isn't sure she is absorbing it all. The family thinks she feels better on the increased dose. Son is "inclined to bump her up to 30mg  TID to see if she feels even better." They need a refill of prednisone. Message to MD for review.

## 2018-01-14 NOTE — Telephone Encounter (Signed)
Reviewed son's call with MD. Dr. Benay Spice does not recommend increasing prednisone. Script refilled for 30mg  daily, per MD. Pt's son notified, voiced understanding.

## 2018-01-15 ENCOUNTER — Telehealth: Payer: Self-pay | Admitting: *Deleted

## 2018-01-15 NOTE — Telephone Encounter (Signed)
Message from pt's son requesting to cancel 3/6 office visit due to weakness and fatigue. Hospice is seeing pt at home.

## 2018-01-16 ENCOUNTER — Ambulatory Visit: Payer: Medicare Other | Admitting: Oncology

## 2018-02-11 DEATH — deceased

## 2019-10-07 IMAGING — DX DG ABDOMEN ACUTE W/ 1V CHEST
3 series · 3 of 3 positions shown · non-contrast
Comparison: Prior CT from 09/04/2017.

CLINICAL DATA: Initial evaluation for acute vomiting, abdominal
pain.

EXAM:
DG ABDOMEN ACUTE W/ 1V CHEST

[w chest pa]
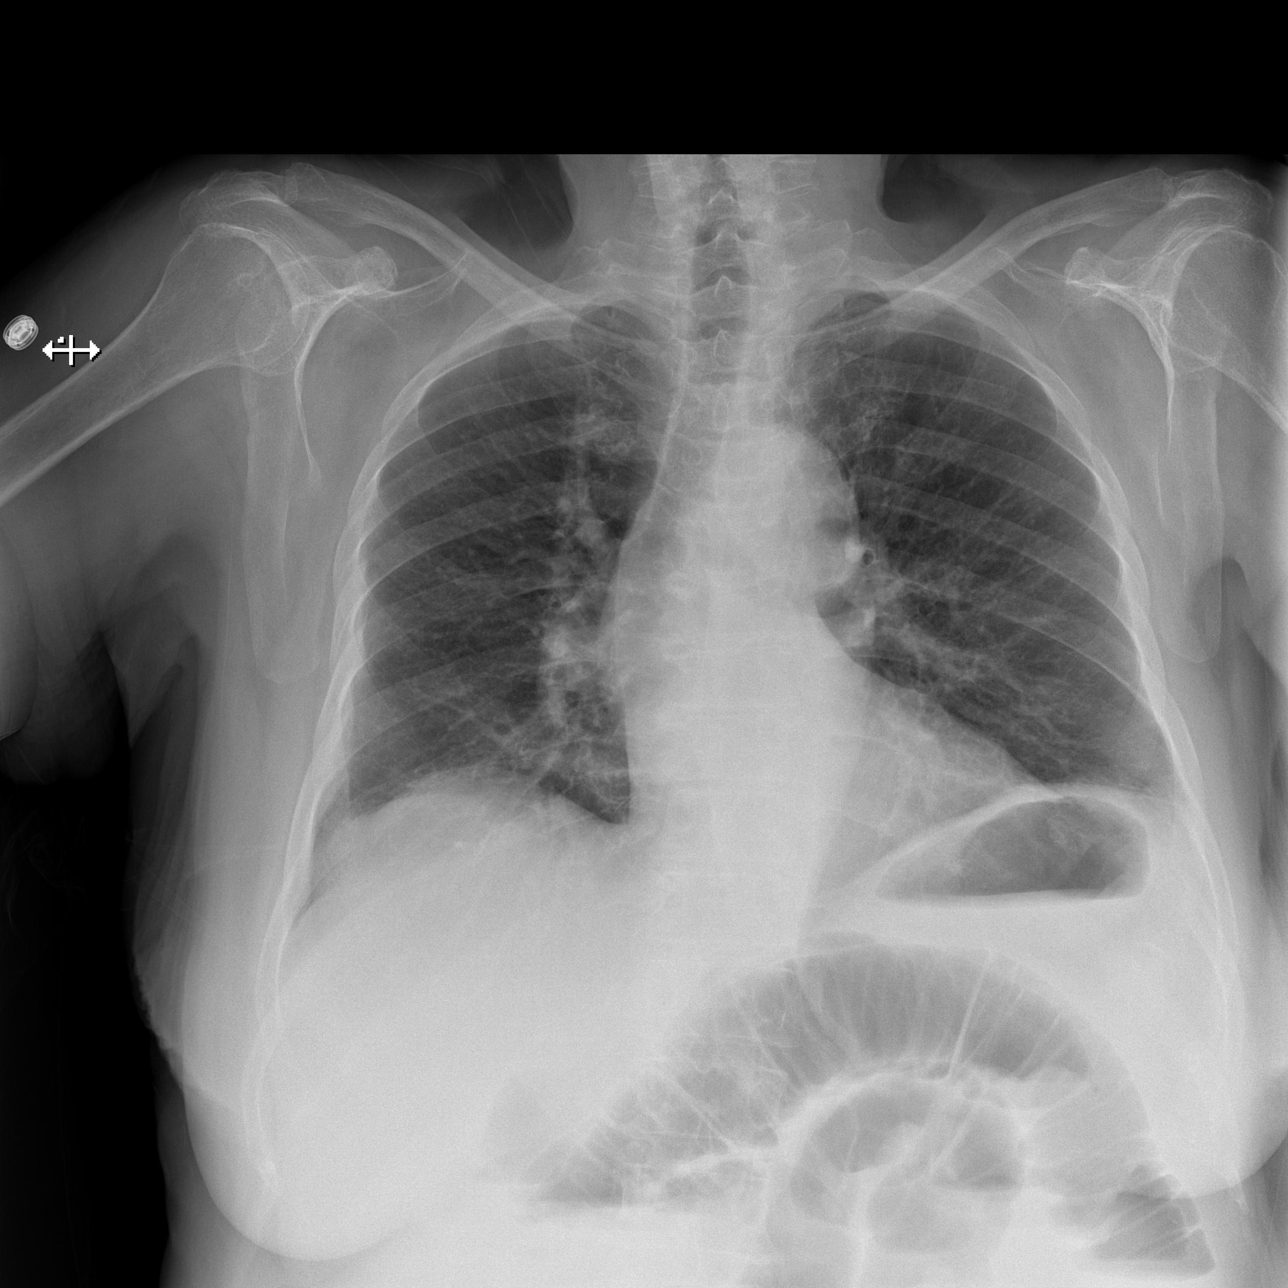

[w abdomen upright]
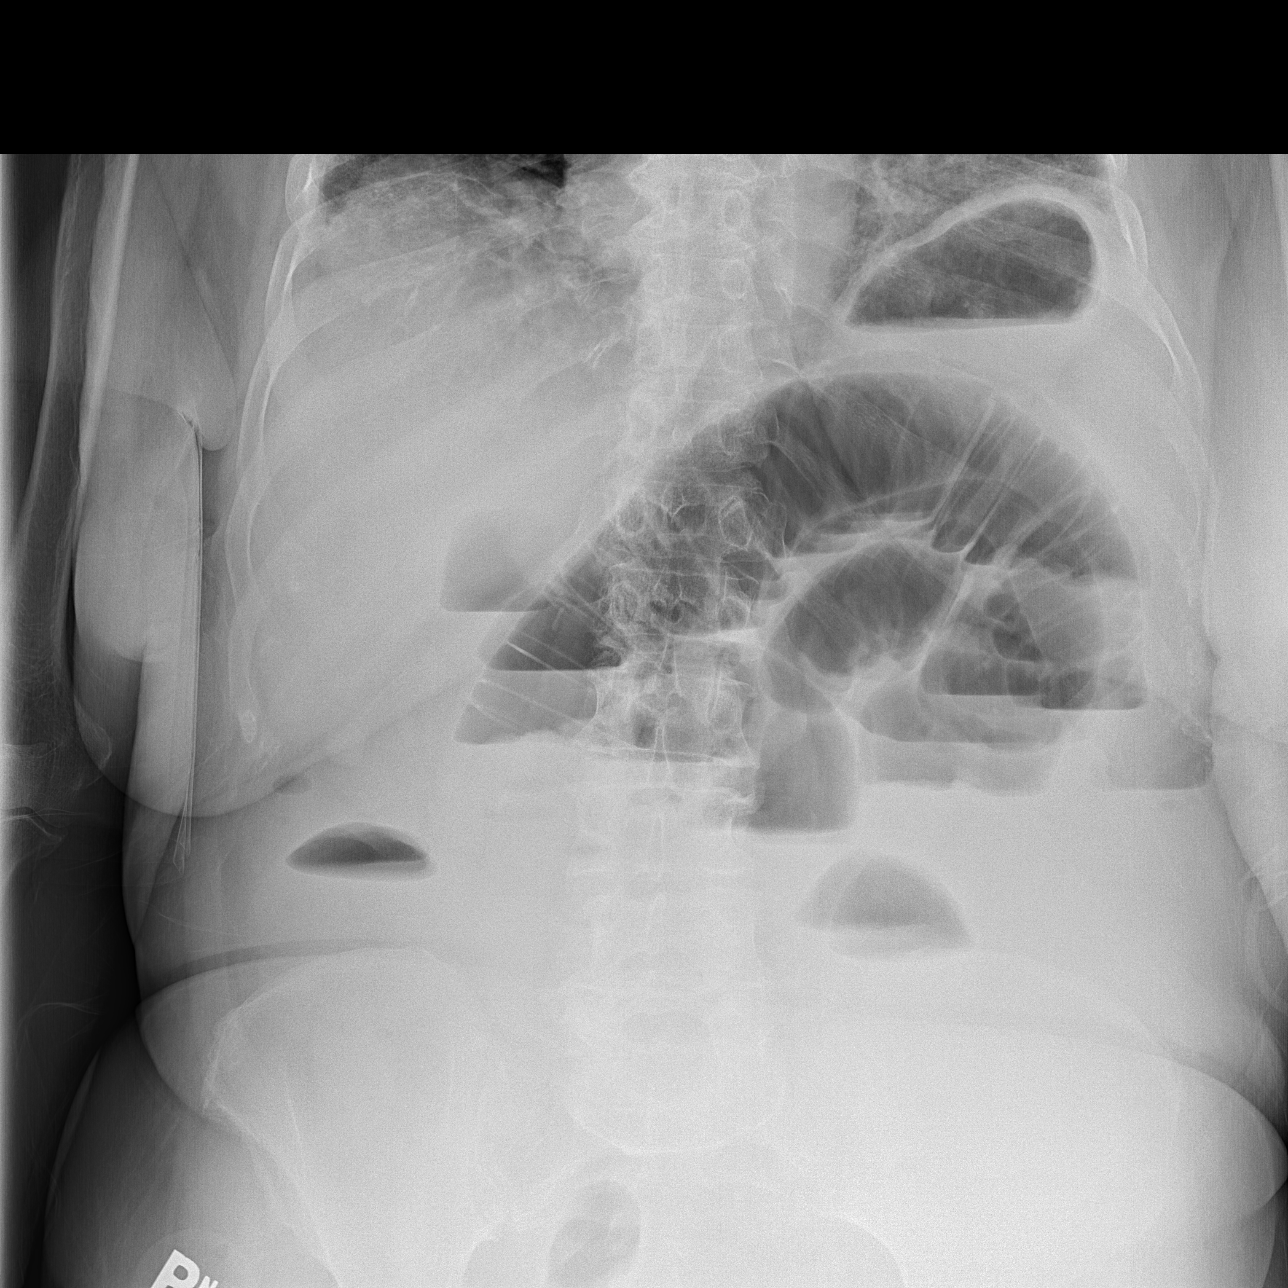

[t abdomen supine]
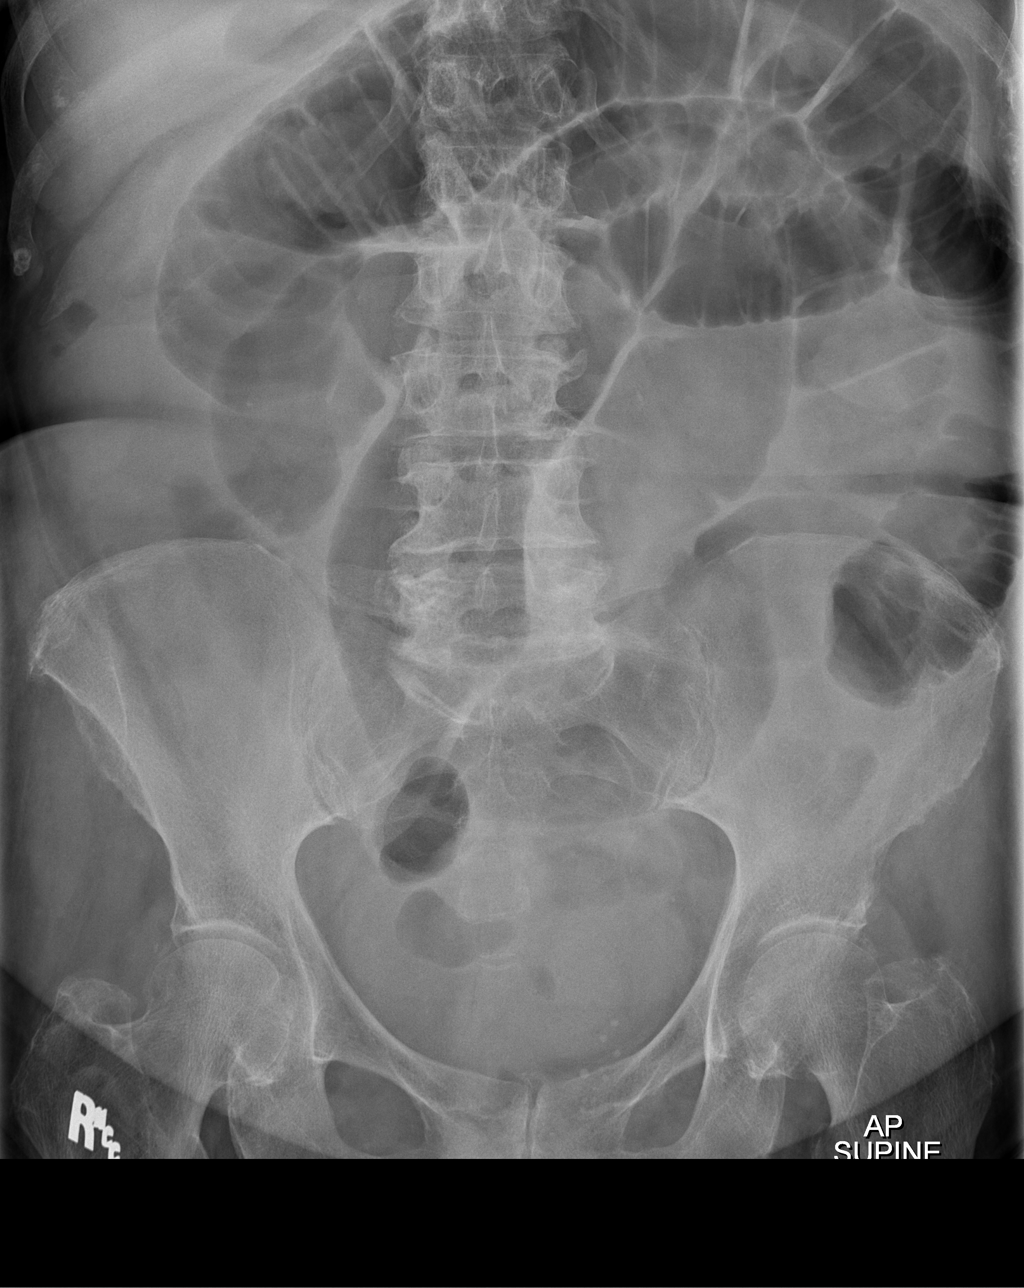

[3 of 3 positions shown; findings below may reference images not displayed]

FINDINGS: Cardiac and mediastinal silhouettes within normal limits.

Lungs hypoinflated. Mild bibasilar atelectasis. No focal
infiltrates. No pulmonary edema or pleural effusion. No
pneumothorax.

Multiple dilated gas-filled loops of small bowel seen within the
upper mid and left abdomen. These measure up to 5.2 cm. Associated
air-fluid levels. Findings consistent with small bowel obstruction.
No soft tissue mass or abnormal calcification. No free air.
IMPRESSION: 1. Multiple dilated gas-filled loops of small bowel with associated
air-fluid levels, consistent with small bowel obstruction.
2. Shallow lung inflation with mild bibasilar atelectasis.

## 2019-10-09 IMAGING — DX DG ABD PORTABLE 1V
1 series · 1 of 1 positions shown · non-contrast
Comparison: Abdominal CT scan September 16, 2017 and abdominal
series of the same day.

CLINICAL DATA: Abdominal pain, small bowel obstruction.

EXAM:
PORTABLE ABDOMEN - 1 VIEW

[abdomen kub]
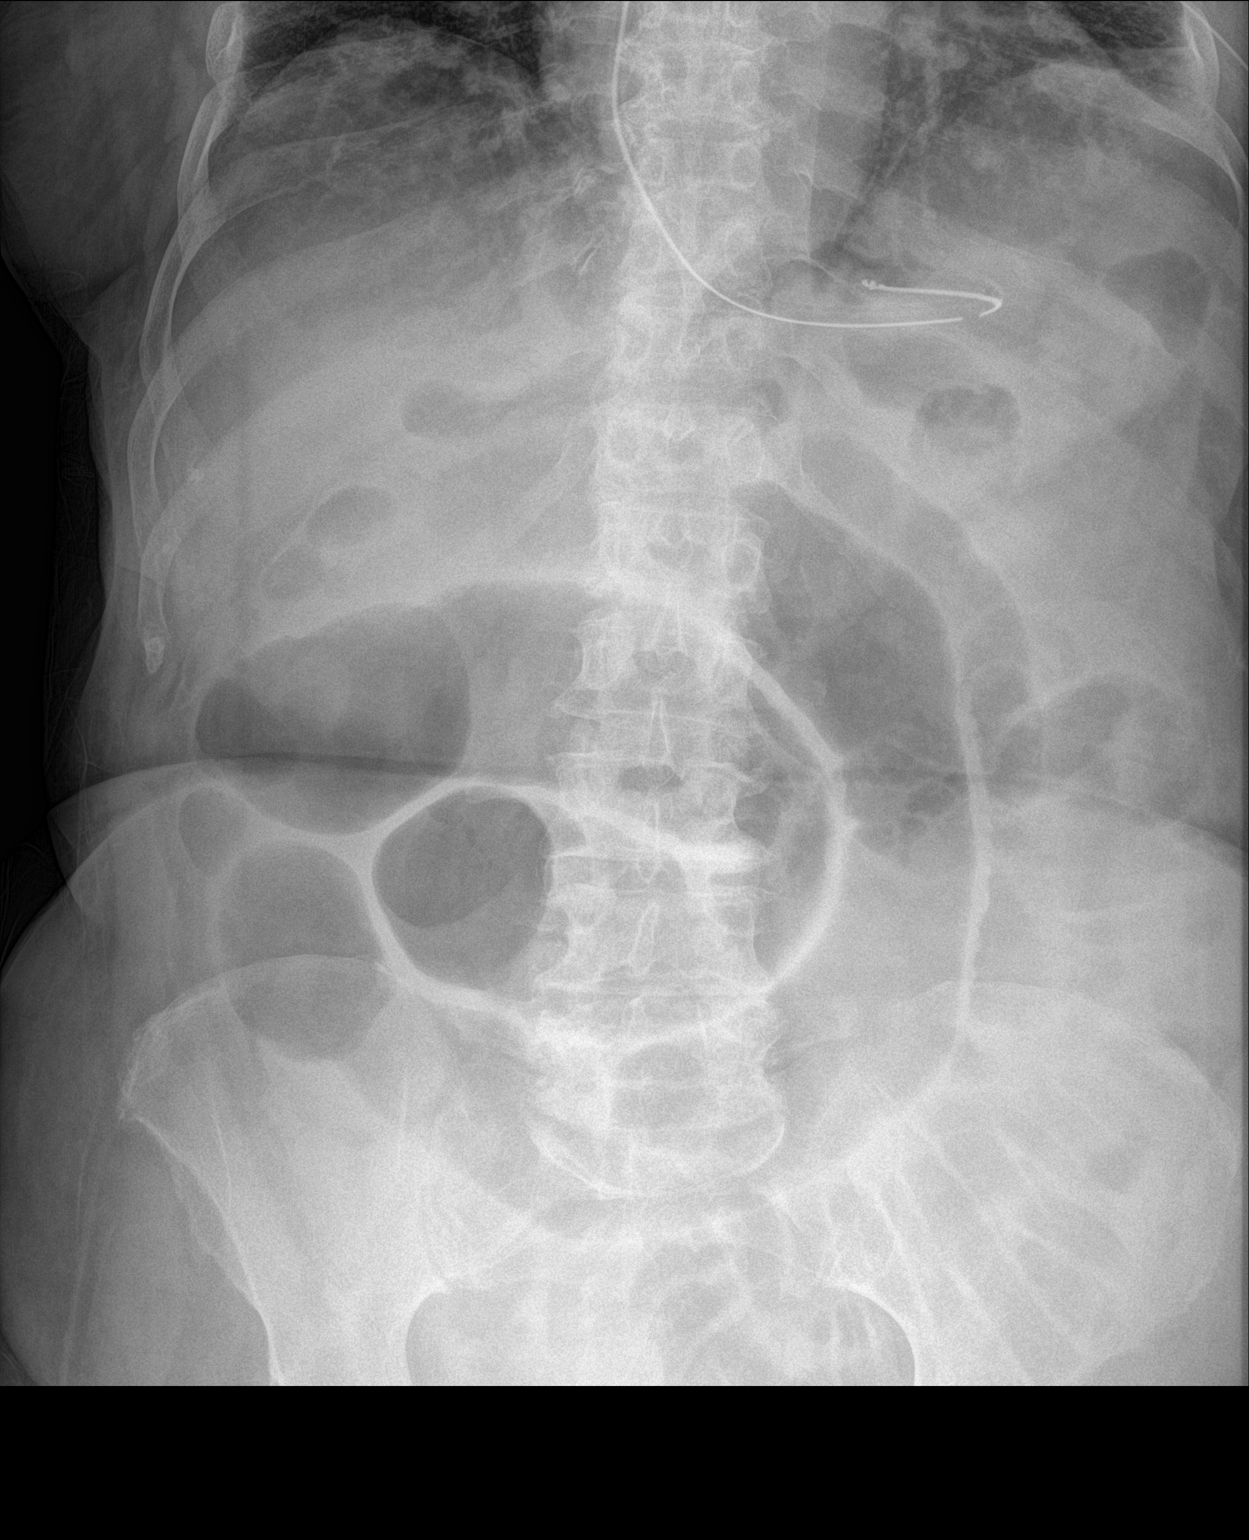

[1 of 1 positions shown; findings below may reference images not displayed]

FINDINGS: There remain loops of moderately distended gas-filled small bowel in
the midline. The uric free of gaseous distention of portions of the
jejunum reaches 8.5 cm. No significant gas or stool is observed in
the colon. The stomach is nondistended. The esophagogastric tube tip
in proximal port project in the gastric cardia. There is contrast
material within the urinary bladder.
IMPRESSION: Persistent mid to distal small bowel obstruction with significant
gaseous distention of small-bowel loops. No evidence of perforation
currently.

## 2019-11-14 IMAGING — DX DG CHEST 1V PORT
1 series · 1 of 1 positions shown · non-contrast
Comparison: 09/30/2017 and 09/25/2017 and 09/16/2017

CLINICAL DATA: Persistent cough.  Chest congestion.

EXAM:
PORTABLE CHEST 1 VIEW

[chest ap]
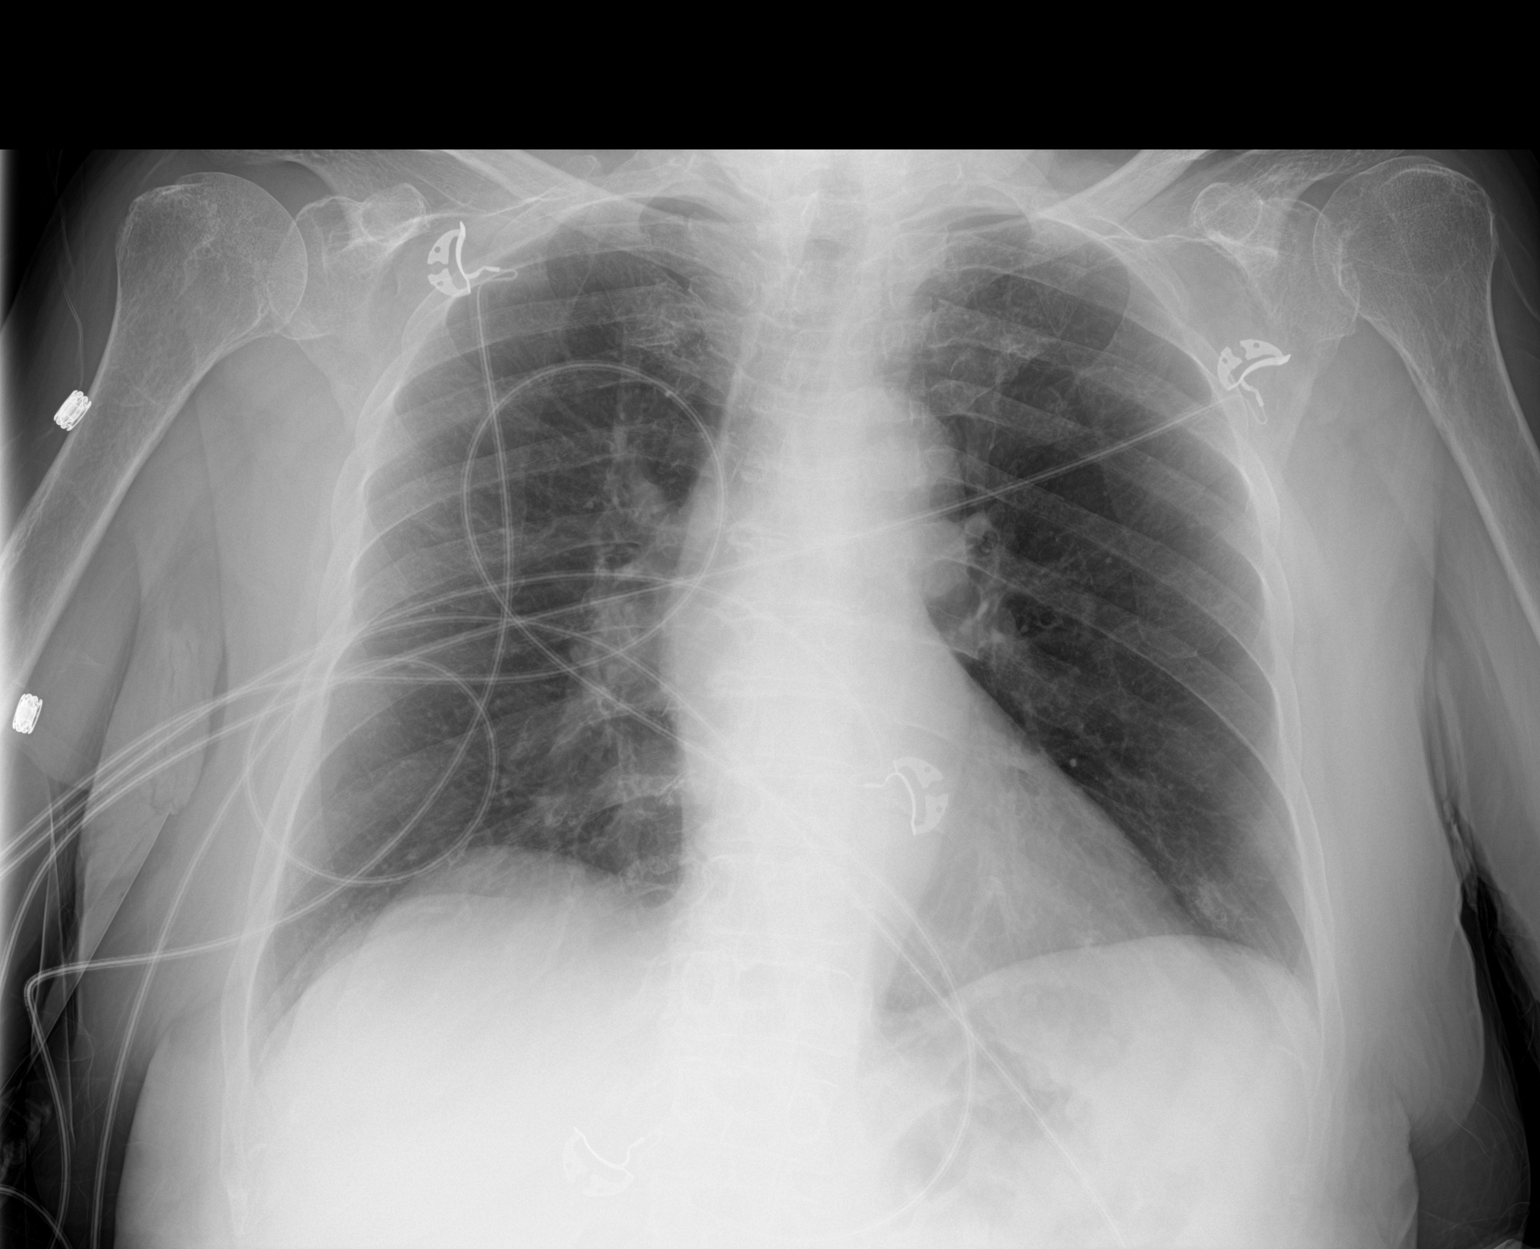

[1 of 1 positions shown; findings below may reference images not displayed]

FINDINGS: The heart size and mediastinal contours are within normal limits.
Both lungs are clear. The visualized skeletal structures are
unremarkable.
IMPRESSION: Normal exam.
